# Patient Record
Sex: Female | Born: 1965 | Race: Black or African American | Hispanic: No | Marital: Married | Smoking: Never smoker
Health system: Southern US, Community
[De-identification: ages and names within clinical notes are randomized; demographics above are authoritative.]

## PROBLEM LIST (undated history)

## (undated) ENCOUNTER — Emergency Department (HOSPITAL_BASED_OUTPATIENT_CLINIC_OR_DEPARTMENT_OTHER): Discharge status: Absent without leave | Payer: Self-pay

## (undated) ENCOUNTER — Emergency Department (HOSPITAL_BASED_OUTPATIENT_CLINIC_OR_DEPARTMENT_OTHER): Disposition: A | Discharge status: Home / Self Care | Payer: Self-pay

## (undated) DIAGNOSIS — M5030 Other cervical disc degeneration, unspecified cervical region: Secondary | ICD-10-CM

## (undated) DIAGNOSIS — K219 Gastro-esophageal reflux disease without esophagitis: Secondary | ICD-10-CM

## (undated) DIAGNOSIS — K5792 Diverticulitis of intestine, part unspecified, without perforation or abscess without bleeding: Secondary | ICD-10-CM

## (undated) DIAGNOSIS — D259 Leiomyoma of uterus, unspecified: Secondary | ICD-10-CM

## (undated) DIAGNOSIS — I10 Essential (primary) hypertension: Secondary | ICD-10-CM

## (undated) DIAGNOSIS — Z9289 Personal history of other medical treatment: Secondary | ICD-10-CM

## (undated) DIAGNOSIS — E119 Type 2 diabetes mellitus without complications: Secondary | ICD-10-CM

## (undated) DIAGNOSIS — E876 Hypokalemia: Secondary | ICD-10-CM

## (undated) DIAGNOSIS — N189 Chronic kidney disease, unspecified: Secondary | ICD-10-CM

## (undated) DIAGNOSIS — R51 Headache: Secondary | ICD-10-CM

## (undated) DIAGNOSIS — N83209 Unspecified ovarian cyst, unspecified side: Secondary | ICD-10-CM

## (undated) DIAGNOSIS — R519 Headache, unspecified: Secondary | ICD-10-CM

## (undated) HISTORY — DX: Hypokalemia: E87.6

## (undated) HISTORY — PX: APPENDECTOMY: SHX54

---

## 1997-07-26 ENCOUNTER — Encounter (INDEPENDENT_AMBULATORY_CARE_PROVIDER_SITE_OTHER): Payer: Self-pay | Admitting: *Deleted

## 1997-07-26 LAB — CONVERTED CEMR LAB

## 1997-11-11 ENCOUNTER — Emergency Department (HOSPITAL_COMMUNITY): Admission: EM | Admit: 1997-11-11 | Discharge: 1997-11-11 | Payer: Self-pay | Admitting: Emergency Medicine

## 1998-01-22 ENCOUNTER — Other Ambulatory Visit: Admission: RE | Admit: 1998-01-22 | Discharge: 1998-01-22 | Payer: Self-pay | Admitting: Internal Medicine

## 2000-01-25 ENCOUNTER — Encounter: Payer: Self-pay | Admitting: *Deleted

## 2000-01-25 ENCOUNTER — Encounter: Payer: Self-pay | Admitting: Emergency Medicine

## 2000-01-26 ENCOUNTER — Inpatient Hospital Stay (HOSPITAL_COMMUNITY): Admission: EM | Admit: 2000-01-26 | Discharge: 2000-01-28 | Payer: Self-pay | Admitting: Emergency Medicine

## 2000-02-05 ENCOUNTER — Encounter: Admission: RE | Admit: 2000-02-05 | Discharge: 2000-02-05 | Payer: Self-pay | Admitting: Family Medicine

## 2001-09-27 ENCOUNTER — Encounter: Payer: Self-pay | Admitting: Emergency Medicine

## 2001-09-27 ENCOUNTER — Emergency Department (HOSPITAL_COMMUNITY): Admission: EM | Admit: 2001-09-27 | Discharge: 2001-09-28 | Payer: Self-pay | Admitting: Emergency Medicine

## 2001-09-29 ENCOUNTER — Other Ambulatory Visit: Admission: RE | Admit: 2001-09-29 | Discharge: 2001-09-29 | Payer: Self-pay | Admitting: Obstetrics & Gynecology

## 2001-10-03 ENCOUNTER — Encounter: Admission: RE | Admit: 2001-10-03 | Discharge: 2001-10-03 | Payer: Self-pay | Admitting: *Deleted

## 2002-02-05 ENCOUNTER — Emergency Department (HOSPITAL_COMMUNITY): Admission: EM | Admit: 2002-02-05 | Discharge: 2002-02-05 | Payer: Self-pay | Admitting: Emergency Medicine

## 2003-07-27 HISTORY — PX: CHOLECYSTECTOMY: SHX55

## 2003-08-28 ENCOUNTER — Emergency Department (HOSPITAL_COMMUNITY): Admission: EM | Admit: 2003-08-28 | Discharge: 2003-08-28 | Payer: Self-pay | Admitting: Family Medicine

## 2004-02-20 ENCOUNTER — Other Ambulatory Visit: Admission: RE | Admit: 2004-02-20 | Discharge: 2004-02-20 | Payer: Self-pay | Admitting: Obstetrics and Gynecology

## 2004-03-05 ENCOUNTER — Ambulatory Visit (HOSPITAL_COMMUNITY): Admission: RE | Admit: 2004-03-05 | Discharge: 2004-03-05 | Payer: Self-pay | Admitting: Obstetrics and Gynecology

## 2004-04-21 ENCOUNTER — Ambulatory Visit (HOSPITAL_COMMUNITY): Admission: RE | Admit: 2004-04-21 | Discharge: 2004-04-21 | Payer: Self-pay | Admitting: Obstetrics and Gynecology

## 2004-04-27 ENCOUNTER — Encounter: Admission: RE | Admit: 2004-04-27 | Discharge: 2004-04-27 | Payer: Self-pay | Admitting: Internal Medicine

## 2004-06-01 ENCOUNTER — Ambulatory Visit: Payer: Self-pay | Admitting: Internal Medicine

## 2004-06-02 ENCOUNTER — Ambulatory Visit: Payer: Self-pay | Admitting: Internal Medicine

## 2004-08-04 ENCOUNTER — Encounter: Admission: RE | Admit: 2004-08-04 | Discharge: 2004-08-04 | Payer: Self-pay | Admitting: General Surgery

## 2004-08-24 ENCOUNTER — Encounter: Admission: RE | Admit: 2004-08-24 | Discharge: 2004-08-24 | Payer: Self-pay | Admitting: General Surgery

## 2004-09-22 ENCOUNTER — Encounter (INDEPENDENT_AMBULATORY_CARE_PROVIDER_SITE_OTHER): Payer: Self-pay | Admitting: *Deleted

## 2004-09-22 ENCOUNTER — Observation Stay (HOSPITAL_COMMUNITY): Admission: RE | Admit: 2004-09-22 | Discharge: 2004-09-23 | Payer: Self-pay | Admitting: General Surgery

## 2005-02-01 ENCOUNTER — Emergency Department (HOSPITAL_COMMUNITY): Admission: EM | Admit: 2005-02-01 | Discharge: 2005-02-01 | Payer: Self-pay | Admitting: Emergency Medicine

## 2005-03-05 ENCOUNTER — Emergency Department (HOSPITAL_COMMUNITY): Admission: EM | Admit: 2005-03-05 | Discharge: 2005-03-05 | Payer: Self-pay | Admitting: Emergency Medicine

## 2006-01-28 ENCOUNTER — Encounter: Admission: RE | Admit: 2006-01-28 | Discharge: 2006-01-28 | Payer: Self-pay | Admitting: Internal Medicine

## 2006-09-23 ENCOUNTER — Encounter (INDEPENDENT_AMBULATORY_CARE_PROVIDER_SITE_OTHER): Payer: Self-pay | Admitting: *Deleted

## 2007-01-05 ENCOUNTER — Emergency Department (HOSPITAL_COMMUNITY): Admission: EM | Admit: 2007-01-05 | Discharge: 2007-01-05 | Payer: Self-pay | Admitting: Emergency Medicine

## 2007-08-03 ENCOUNTER — Emergency Department (HOSPITAL_COMMUNITY): Admission: EM | Admit: 2007-08-03 | Discharge: 2007-08-03 | Payer: Self-pay | Admitting: Emergency Medicine

## 2007-08-11 ENCOUNTER — Emergency Department (HOSPITAL_COMMUNITY): Admission: EM | Admit: 2007-08-11 | Discharge: 2007-08-11 | Payer: Self-pay | Admitting: Emergency Medicine

## 2008-01-19 ENCOUNTER — Emergency Department (HOSPITAL_COMMUNITY): Admission: EM | Admit: 2008-01-19 | Discharge: 2008-01-19 | Payer: Self-pay | Admitting: Emergency Medicine

## 2008-05-01 ENCOUNTER — Emergency Department (HOSPITAL_COMMUNITY): Admission: EM | Admit: 2008-05-01 | Discharge: 2008-05-01 | Payer: Self-pay | Admitting: Family Medicine

## 2008-07-08 ENCOUNTER — Emergency Department (HOSPITAL_COMMUNITY): Admission: EM | Admit: 2008-07-08 | Discharge: 2008-07-08 | Payer: Self-pay | Admitting: Family Medicine

## 2008-10-18 ENCOUNTER — Encounter (INDEPENDENT_AMBULATORY_CARE_PROVIDER_SITE_OTHER): Payer: Self-pay | Admitting: Nurse Practitioner

## 2008-10-18 ENCOUNTER — Ambulatory Visit: Payer: Self-pay | Admitting: Internal Medicine

## 2008-10-18 DIAGNOSIS — K219 Gastro-esophageal reflux disease without esophagitis: Secondary | ICD-10-CM | POA: Insufficient documentation

## 2008-11-01 ENCOUNTER — Ambulatory Visit: Payer: Self-pay | Admitting: *Deleted

## 2008-11-13 ENCOUNTER — Encounter (INDEPENDENT_AMBULATORY_CARE_PROVIDER_SITE_OTHER): Payer: Self-pay | Admitting: Nurse Practitioner

## 2008-11-13 ENCOUNTER — Ambulatory Visit: Payer: Self-pay | Admitting: Nurse Practitioner

## 2008-11-13 DIAGNOSIS — D509 Iron deficiency anemia, unspecified: Secondary | ICD-10-CM

## 2008-11-13 DIAGNOSIS — R3129 Other microscopic hematuria: Secondary | ICD-10-CM | POA: Insufficient documentation

## 2008-11-13 DIAGNOSIS — G43909 Migraine, unspecified, not intractable, without status migrainosus: Secondary | ICD-10-CM

## 2008-11-13 DIAGNOSIS — N760 Acute vaginitis: Secondary | ICD-10-CM | POA: Insufficient documentation

## 2008-11-13 DIAGNOSIS — K921 Melena: Secondary | ICD-10-CM | POA: Insufficient documentation

## 2008-11-13 DIAGNOSIS — D649 Anemia, unspecified: Secondary | ICD-10-CM

## 2008-11-13 HISTORY — DX: Migraine, unspecified, not intractable, without status migrainosus: G43.909

## 2008-11-13 HISTORY — DX: Iron deficiency anemia, unspecified: D50.9

## 2008-11-13 LAB — CONVERTED CEMR LAB
ALT: 20 units/L (ref 0–35)
AST: 13 units/L (ref 0–37)
Albumin: 4.3 g/dL (ref 3.5–5.2)
Alkaline Phosphatase: 66 units/L (ref 39–117)
BUN: 17 mg/dL (ref 6–23)
Basophils Absolute: 0 10*3/uL (ref 0.0–0.1)
Basophils Relative: 0 % (ref 0–1)
Bilirubin Urine: NEGATIVE
CO2: 26 meq/L (ref 19–32)
Calcium: 9.7 mg/dL (ref 8.4–10.5)
Chlamydia, DNA Probe: NEGATIVE
Chloride: 105 meq/L (ref 96–112)
Cholesterol: 150 mg/dL (ref 0–200)
Creatinine, Ser: 0.46 mg/dL (ref 0.40–1.20)
Eosinophils Absolute: 0.1 10*3/uL (ref 0.0–0.7)
Eosinophils Relative: 1 % (ref 0–5)
GC Probe Amp, Genital: NEGATIVE
Glucose, Bld: 82 mg/dL (ref 70–99)
Glucose, Urine, Semiquant: NEGATIVE
HCT: 37.9 % (ref 36.0–46.0)
HDL: 40 mg/dL (ref >39)
Hemoglobin: 11.8 g/dL — ABNORMAL LOW (ref 12.0–15.0)
KOH Prep: NEGATIVE
Ketones, urine, test strip: NEGATIVE
LDL Cholesterol: 94 mg/dL (ref 0–99)
Lymphocytes Relative: 37 % (ref 12–46)
Lymphs Abs: 2.6 10*3/uL (ref 0.7–4.0)
MCHC: 31.1 g/dL (ref 30.0–36.0)
MCV: 65.5 fL — ABNORMAL LOW (ref 78.0–100.0)
Monocytes Absolute: 0.4 10*3/uL (ref 0.1–1.0)
Monocytes Relative: 6 % (ref 3–12)
Neutro Abs: 4 10*3/uL (ref 1.7–7.7)
Neutrophils Relative %: 57 % (ref 43–77)
Nitrite: NEGATIVE
Platelets: 314 10*3/uL (ref 150–400)
Potassium: 4.5 meq/L (ref 3.5–5.3)
Protein, U semiquant: 30
RBC: 5.79 M/uL — ABNORMAL HIGH (ref 3.87–5.11)
RDW: 17.1 % — ABNORMAL HIGH (ref 11.5–15.5)
Sodium: 142 meq/L (ref 135–145)
Specific Gravity, Urine: 1.015
TSH: 0.908 microintl units/mL (ref 0.350–4.500)
Total Bilirubin: 0.3 mg/dL (ref 0.3–1.2)
Total CHOL/HDL Ratio: 3.8
Total Protein: 7.8 g/dL (ref 6.0–8.3)
Triglycerides: 78 mg/dL (ref <150)
Urobilinogen, UA: 0.2
VLDL: 16 mg/dL (ref 0–40)
WBC Urine, dipstick: NEGATIVE
WBC: 7 10*3/uL (ref 4.0–10.5)
pH: 7

## 2008-11-14 ENCOUNTER — Encounter (INDEPENDENT_AMBULATORY_CARE_PROVIDER_SITE_OTHER): Payer: Self-pay | Admitting: Nurse Practitioner

## 2008-11-15 ENCOUNTER — Encounter (INDEPENDENT_AMBULATORY_CARE_PROVIDER_SITE_OTHER): Payer: Self-pay | Admitting: Nurse Practitioner

## 2008-11-15 ENCOUNTER — Ambulatory Visit (HOSPITAL_COMMUNITY): Admission: RE | Admit: 2008-11-15 | Discharge: 2008-11-15 | Payer: Self-pay | Admitting: Family Medicine

## 2008-11-15 LAB — CONVERTED CEMR LAB: Retic Ct Pct: 1.2 % (ref 0.4–3.1)

## 2008-12-19 ENCOUNTER — Telehealth (INDEPENDENT_AMBULATORY_CARE_PROVIDER_SITE_OTHER): Payer: Self-pay | Admitting: Nurse Practitioner

## 2009-02-05 ENCOUNTER — Emergency Department (HOSPITAL_COMMUNITY): Admission: EM | Admit: 2009-02-05 | Discharge: 2009-02-05 | Payer: Self-pay | Admitting: Family Medicine

## 2009-02-12 ENCOUNTER — Encounter (INDEPENDENT_AMBULATORY_CARE_PROVIDER_SITE_OTHER): Payer: Self-pay | Admitting: Nurse Practitioner

## 2009-02-12 ENCOUNTER — Ambulatory Visit: Payer: Self-pay | Admitting: Family Medicine

## 2009-02-12 DIAGNOSIS — K625 Hemorrhage of anus and rectum: Secondary | ICD-10-CM | POA: Insufficient documentation

## 2009-02-12 DIAGNOSIS — R109 Unspecified abdominal pain: Secondary | ICD-10-CM | POA: Insufficient documentation

## 2009-02-12 LAB — CONVERTED CEMR LAB
Basophils Absolute: 0 10*3/uL (ref 0.0–0.1)
Basophils Relative: 0 % (ref 0–1)
Eosinophils Absolute: 0.1 10*3/uL (ref 0.0–0.7)
Eosinophils Relative: 1 % (ref 0–5)
HCT: 39.3 % (ref 36.0–46.0)
Helicobacter Pylori Antibody-IgG: 1 — ABNORMAL HIGH
Hemoglobin: 11.7 g/dL — ABNORMAL LOW (ref 12.0–15.0)
Lymphocytes Relative: 35 % (ref 12–46)
Lymphs Abs: 2.4 10*3/uL (ref 0.7–4.0)
MCHC: 29.8 g/dL — ABNORMAL LOW (ref 30.0–36.0)
MCV: 69.2 fL — ABNORMAL LOW (ref 78.0–100.0)
Monocytes Absolute: 0.4 10*3/uL (ref 0.1–1.0)
Monocytes Relative: 6 % (ref 3–12)
Neutro Abs: 4 10*3/uL (ref 1.7–7.7)
Neutrophils Relative %: 58 % (ref 43–77)
Platelets: 330 10*3/uL (ref 150–400)
RBC: 5.68 M/uL — ABNORMAL HIGH (ref 3.87–5.11)
RDW: 17.9 % — ABNORMAL HIGH (ref 11.5–15.5)
WBC: 6.8 10*3/uL (ref 4.0–10.5)

## 2009-02-14 ENCOUNTER — Encounter (INDEPENDENT_AMBULATORY_CARE_PROVIDER_SITE_OTHER): Payer: Self-pay | Admitting: Family Medicine

## 2009-02-19 ENCOUNTER — Ambulatory Visit (HOSPITAL_COMMUNITY): Admission: RE | Admit: 2009-02-19 | Discharge: 2009-02-19 | Payer: Self-pay | Admitting: Internal Medicine

## 2009-02-21 ENCOUNTER — Encounter (INDEPENDENT_AMBULATORY_CARE_PROVIDER_SITE_OTHER): Payer: Self-pay | Admitting: Nurse Practitioner

## 2009-02-26 ENCOUNTER — Ambulatory Visit: Payer: Self-pay | Admitting: Nurse Practitioner

## 2009-02-26 DIAGNOSIS — K5732 Diverticulitis of large intestine without perforation or abscess without bleeding: Secondary | ICD-10-CM | POA: Insufficient documentation

## 2009-02-26 HISTORY — DX: Diverticulitis of large intestine without perforation or abscess without bleeding: K57.32

## 2009-02-26 LAB — CONVERTED CEMR LAB
Cholesterol, target level: 200 mg/dL
HDL goal, serum: 40 mg/dL
LDL Goal: 160 mg/dL

## 2009-11-02 ENCOUNTER — Emergency Department (HOSPITAL_COMMUNITY): Admission: EM | Admit: 2009-11-02 | Discharge: 2009-11-02 | Payer: Self-pay | Admitting: Family Medicine

## 2009-11-11 ENCOUNTER — Ambulatory Visit: Payer: Self-pay | Admitting: Nurse Practitioner

## 2009-11-11 DIAGNOSIS — K047 Periapical abscess without sinus: Secondary | ICD-10-CM | POA: Insufficient documentation

## 2009-11-11 DIAGNOSIS — E669 Obesity, unspecified: Secondary | ICD-10-CM | POA: Insufficient documentation

## 2009-11-11 LAB — CONVERTED CEMR LAB
ALT: 17 units/L (ref 0–35)
AST: 11 units/L (ref 0–37)
Albumin: 4.5 g/dL (ref 3.5–5.2)
Alkaline Phosphatase: 58 units/L (ref 39–117)
BUN: 12 mg/dL (ref 6–23)
Basophils Absolute: 0 10*3/uL (ref 0.0–0.1)
Basophils Relative: 0 % (ref 0–1)
CO2: 25 meq/L (ref 19–32)
Calcium: 9.7 mg/dL (ref 8.4–10.5)
Chloride: 103 meq/L (ref 96–112)
Cholesterol: 148 mg/dL (ref 0–200)
Creatinine, Ser: 0.55 mg/dL (ref 0.40–1.20)
Eosinophils Absolute: 0 10*3/uL (ref 0.0–0.7)
Eosinophils Relative: 1 % (ref 0–5)
Glucose, Bld: 98 mg/dL (ref 70–99)
HCT: 41.2 % (ref 36.0–46.0)
HDL: 39 mg/dL — ABNORMAL LOW (ref >39)
Hemoglobin: 12.4 g/dL (ref 12.0–15.0)
LDL Cholesterol: 94 mg/dL (ref 0–99)
Lymphocytes Relative: 30 % (ref 12–46)
Lymphs Abs: 2.5 10*3/uL (ref 0.7–4.0)
MCHC: 30.1 g/dL (ref 30.0–36.0)
MCV: 70.9 fL — ABNORMAL LOW (ref 78.0–100.0)
Monocytes Absolute: 0.4 10*3/uL (ref 0.1–1.0)
Monocytes Relative: 5 % (ref 3–12)
Neutro Abs: 5.4 10*3/uL (ref 1.7–7.7)
Neutrophils Relative %: 64 % (ref 43–77)
Platelets: 330 10*3/uL (ref 150–400)
Potassium: 4.4 meq/L (ref 3.5–5.3)
RBC: 5.81 M/uL — ABNORMAL HIGH (ref 3.87–5.11)
RDW: 18 % — ABNORMAL HIGH (ref 11.5–15.5)
Rapid HIV Screen: NEGATIVE
Sodium: 138 meq/L (ref 135–145)
TSH: 0.754 microintl units/mL (ref 0.350–4.500)
Total Bilirubin: 0.3 mg/dL (ref 0.3–1.2)
Total CHOL/HDL Ratio: 3.8
Total Protein: 7.8 g/dL (ref 6.0–8.3)
Triglycerides: 74 mg/dL (ref <150)
VLDL: 15 mg/dL (ref 0–40)
WBC: 8.3 10*3/uL (ref 4.0–10.5)

## 2009-11-12 ENCOUNTER — Encounter (INDEPENDENT_AMBULATORY_CARE_PROVIDER_SITE_OTHER): Payer: Self-pay | Admitting: Nurse Practitioner

## 2009-12-24 ENCOUNTER — Ambulatory Visit: Payer: Self-pay | Admitting: Nurse Practitioner

## 2009-12-24 LAB — CONVERTED CEMR LAB
Bilirubin Urine: NEGATIVE
Chlamydia, DNA Probe: NEGATIVE
GC Probe Amp, Genital: NEGATIVE
Glucose, Urine, Semiquant: NEGATIVE
KOH Prep: NEGATIVE
Ketones, urine, test strip: NEGATIVE
Nitrite: NEGATIVE
OCCULT 1: NEGATIVE
Specific Gravity, Urine: 1.015
Urobilinogen, UA: 0.2
WBC Urine, dipstick: NEGATIVE
pH: 7

## 2009-12-26 ENCOUNTER — Ambulatory Visit (HOSPITAL_COMMUNITY): Admission: RE | Admit: 2009-12-26 | Discharge: 2009-12-26 | Payer: Self-pay | Admitting: Internal Medicine

## 2009-12-28 ENCOUNTER — Emergency Department (HOSPITAL_COMMUNITY): Admission: EM | Admit: 2009-12-28 | Discharge: 2009-12-28 | Payer: Self-pay | Admitting: Emergency Medicine

## 2009-12-31 ENCOUNTER — Encounter (INDEPENDENT_AMBULATORY_CARE_PROVIDER_SITE_OTHER): Payer: Self-pay | Admitting: Nurse Practitioner

## 2009-12-31 LAB — CONVERTED CEMR LAB: Pap Smear: NEGATIVE

## 2010-04-30 ENCOUNTER — Telehealth (INDEPENDENT_AMBULATORY_CARE_PROVIDER_SITE_OTHER): Payer: Self-pay | Admitting: Nurse Practitioner

## 2010-08-25 NOTE — Assessment & Plan Note (Signed)
Summary: Complete Physical Exam   Vital Signs:  Patient profile:   45 year old female Menstrual status:  last cycle in 2005 Weight:      202.2 pounds BMI:     40.99 BSA:     1.85 Temp:     97.9 degrees F oral Pulse rate:   88 / minute Pulse rhythm:   regular Resp:     20 per minute BP sitting:   135 / 92  (left arm) Cuff size:   large  Vitals Entered By: Levon Hedger (December 24, 2009 9:06 AM) CC: CPP...tooth pain  Is Patient Diabetic? No Pain Assessment Patient in pain? yes     Location: tooth Intensity: 15 Onset of pain  Constant  Does patient need assistance? Functional Status Self care Ambulation Normal   CC:  CPP...tooth pain .  History of Present Illness:  Pt into the office for a complete physical exam  PAP - Maternal aunt with cervical cancer All previous pt pap smears normal Menses - none since 2006 after she had her gallbladder removed. Unsure if mother went through early menopause  Mammogram - last done 1 year ago mother and sister with breast cancer survival already scheduled next mammogram on this friday  Optho - no glasses. no recent dental exam  Dental - seen in this office for an abscess 1 month ago. Still with dental problems and she called this morning and she has an appt for monday  tdap - up to date  Habits & Providers  Alcohol-Tobacco-Diet     Alcohol drinks/day: 0     Tobacco Status: never  Exercise-Depression-Behavior     Does Patient Exercise: no     Exercise Counseling: to improve exercise regimen     Have you felt down or hopeless? no     Have you felt little pleasure in things? no     Drug Use: never     Seat Belt Use: sometimes  Comments: PHQ- 9 score 17  Allergies: No Known Drug Allergies  Review of Systems General:  Denies fever; mouth pain from dental problems. Eyes:  Denies blurring. ENT:  Denies earache. CV:  Denies chest pain or discomfort. Resp:  Denies cough. GI:  Complains of diarrhea; denies  abdominal pain; intermittent. GU:  Denies discharge. MS:  Denies joint pain. Derm:  Denies rash. Neuro:  Denies headaches. Psych:  Denies anxiety and depression.  Physical Exam  General:  alert.   Head:  normocephalic.   Eyes:  pupils reactive to light.   Ears:  ear piercing(s) noted.   Nose:  no nasal discharge.   Mouth:  right lower molar - broken tooth inflammed gingiva Neck:  supple.   Chest Wall:  no mass.   Breasts:  right upper outer quad - dense tissue Lungs:  normal breath sounds.   Heart:  normal rate and regular rhythm.   Abdomen:  soft, non-tender, and normal bowel sounds.   Rectal:  no external abnormalities.   Msk:  normal ROM.   Pulses:  R radial normal and L radial normal.   Extremities:  no edema Neurologic:  alert & oriented X3.   Skin:  color normal.   Psych:  Oriented X3.    Pelvic Exam  Vulva:      normal appearance.   Urethra and Bladder:      Urethra--normal.   Vagina:      physiologic discharge.   Cervix:      normal.   Uterus:  smooth.   Adnexa:      nontender bilaterally.   Rectum:      normal, heme negative stool.      Impression & Recommendations:  Problem # 1:  ROUTINE GYNECOLOGICAL EXAMINATION (ICD-V72.31) PHQ- 9 score = 17 PAP done dental referral done - pt has an appt on next week rec optho exam guaiac negative barium enema done in 2010 Orders: UA Dipstick w/o Micro (manual) (81191) KOH/ WET Mount [phone]) Hemoccult Guaiac-1 spec.(in office) (82270) Pap Smear, Thin Prep ( Collection of) (Q0091) T- GC Chlamydia (56213)  Problem # 2:  OTHER SCREENING BREAST EXAMINATION (ICD-V76.19) mammogram scheduled by pt in 2 days advised pt to keep appt mother and sister with hx of breast cancer  Problem # 3:  OBESITY (ICD-278.00) advised pt to increase her physical activity  Problem # 4:  DIVERTICULITIS OF COLON (ICD-562.11) handout given with dx  Problem # 5:  ABSCESS, TOOTH (ICD-522.5) pt has an appt on next  week will give her another course of antibiotics and pain meds - pt still has some swelling and pain today  Complete Medication List: 1)  Protonix 40 Mg Tbec (Pantoprazole sodium) .... One tablet by mouth daily before breakfast 2)  Maxalt 10 Mg Tabs (Rizatriptan benzoate) .... One tablet by mouth at onset of headache, may repeat in 2 hours if headache continues 3)  Ferrous Sulfate 325 (65 Fe) Mg Tabs (Ferrous sulfate) .... One tablet by mouth daily 4)  Amoxicillin 500 Mg Caps (Amoxicillin) .... One capsule by mouth three times a day 5)  Tylenol With Codeine #3 300-30 Mg Tabs (Acetaminophen-codeine) .... One tablet by mouth every 6 hours as needed for pain 6)  Ibuprofen 800 Mg Tabs (Ibuprofen) .... One tablet by mouth two times a day as needed for pain  Patient Instructions: 1)  keep appointment for mammogram 2)  Dental - keep your appointment 3)  May restart amoxil three times a day for infection 4)  Tylenol with codiene as needed for pain 5)  Follow up at least every 6 months with n.martin,fnp Prescriptions: PROTONIX 40 MG TBEC (PANTOPRAZOLE SODIUM) ONe tablet by mouth daily before breakfast  #30 x 5   Entered and Authorized by:   Lehman Prom FNP   Signed by:   Lehman Prom FNP on 12/25/2009   Method used:   Faxed to ...       [facility] (retail)       [address] [place], [place]  [address]       Ph: [phone]       Fax: [phone]   RxID:   [id number] TYLENOL WITH CODEINE #3 300-30 MG TABS (ACETAMINOPHEN-CODEINE) One tablet by mouth every 6 hours as needed for pain  #30 x 0   Entered and Authorized by:   Lehman Prom FNP   Signed by:   Lehman Prom FNP on 12/24/2009   Method used:   Print then Give to Patient   RxID:   [id number] AMOXICILLIN 500 MG CAPS (AMOXICILLIN) One capsule by mouth three times a day  #30 x 0   Entered and Authorized by:   Lehman Prom FNP   Signed by:   Lehman Prom FNP on  12/24/2009   Method used:   Print then Give to Patient   RxID:   [id number]   Laboratory Results   Urine Tests  Date/Time Received: December 24, 2009 9:16 AM   Routine Urinalysis  Color: lt. yellow Appearance: Hazy Glucose: negative   (Normal Range: Negative) Bilirubin: negative   (Normal Range: Negative) Ketone: negative   (Normal Range: Negative) Spec. Gravity: 1.015   (Normal Range: 1.003-1.035) Blood: trace-lysed   (Normal Range: Negative) pH: 7.0   (Normal Range: 5.0-8.0) Protein: trace   (Normal Range: Negative) Urobilinogen: 0.2   (Normal Range: 0-1) Nitrite: negative   (Normal Range: Negative) Leukocyte Esterace: negative   (Normal Range: Negative)      Wet Mount/KOH Source: vaginal WBC/hpf: 1-5 Bacteria/hpf: rare Clue cells/hpf: none Yeast/hpf: none Trichomonas/hpf: none  Stool - Occult Blood Hemmoccult #1: negative Date: 12/24/2009    Laboratory Results   Urine Tests    Routine Urinalysis   Color: lt. yellow Appearance: Hazy Glucose: negative   (Normal Range: Negative) Bilirubin: negative   (Normal Range: Negative) Ketone: negative   (Normal Range: Negative) Spec. Gravity: 1.015   (Normal Range: 1.003-1.035) Blood: trace-lysed   (Normal Range: Negative) pH: 7.0   (Normal Range: 5.0-8.0) Protein: trace   (Normal Range: Negative) Urobilinogen: 0.2   (Normal Range: 0-1) Nitrite: negative   (Normal Range: Negative) Leukocyte Esterace: negative   (Normal Range: Negative)      Wet Mount Wet Mount KOH: Negative  Stool - Occult Blood Hemmoccult #1: negative    Prevention & Chronic Care Immunizations   Influenza vaccine: Not documented    Tetanus booster: 07/26/2004: historical per pt    Pneumococcal vaccine: Not documented  Other Screening   Pap smear:  Specimen Adequacy: Satisfactory for evaluation.   Interpretation/Result:Negative for intraepithelial Lesion or Malignancy.     (11/13/2008)   Pap smear action/deferral: Ordered   (12/24/2009)    Mammogram: ASSESSMENT: Negative - BI-RADS 1^MM DIGITAL SCREENING  (11/15/2008)   Mammogram action/deferral: Ordered  (12/24/2009)   Smoking status: never  (12/24/2009)  Lipids   Total Cholesterol: 148  (11/11/2009)   LDL: 94  (11/11/2009)   LDL Direct: Not documented   HDL: 39  (11/11/2009)   Triglycerides: 74  (11/11/2009)

## 2010-08-25 NOTE — Letter (Signed)
Summary: TEST ORDER FORM/RADIOLOGY/APPT DATE & TIME  TEST ORDER FORM/RADIOLOGY/APPT DATE & TIME   Imported By: Arta Bruce 02/14/2009 10:47:49  _____________________________________________________________________  External Attachment:    Type:   Image     Comment:   External Document

## 2010-08-25 NOTE — Letter (Signed)
Summary: REFERRAL//DENTAL  REFERRAL//DENTAL   Imported By: Arta Bruce 11/12/2009 10:00:38  _____________________________________________________________________  External Attachment:    Type:   Image     Comment:   External Document

## 2010-08-25 NOTE — Letter (Signed)
Summary: Handout Printed  Printed Handout:  - Headache, Migraine 

## 2010-08-25 NOTE — Letter (Signed)
Summary: *HSN Results Follow up  [facility]  [address] [place], [place] [address]   Phone: [phone]  Fax: [phone]      12/31/2009   SMRITI BARKOW [address] [place], [place]  [address]   Dear  Ms. Sarika Spadoni,                            ____S.Drinkard,FNP   ____D. Gore,FNP       ____B. McPherson,MD   ____V. Rankins,MD    ____E. Mulberry,MD    _X___N. Daphine Deutscher, FNP  ____D. Reche Dixon, MD    ____K. Philipp Deputy, MD    ____Other     This letter is to inform you that your recent test(s):  _______Pap Smear    ___X____Lab Test     _______X-ray    ___X____ is within acceptable limits  _______ requires a medication change  _______ requires a follow-up lab visit  _______ requires a follow-up visit with your provider   Comments: Pap smear results normal.       _________________________________________________________ If you have any questions, please contact our office [phone].                    Sincerely,    Lehman Prom FNP [facility]

## 2010-08-25 NOTE — Letter (Signed)
Summary: *HSN Results Follow up  [facility]  [address] [place], [place] [address]   Phone: [phone]  Fax: [phone]      11/15/2008   SHELISA FERN [address] [place], [place]  [address]   Dear  Ms. Analyah Duva,                            ____S.Drinkard,FNP   ____D. Gore,FNP       ____B. McPherson,MD   ____V. Rankins,MD    ____E. Mulberry,MD    __X__N. Daphine Deutscher, FNP  ____D. Reche Dixon, MD    ____K. Philipp Deputy, MD    ____Other     This letter is to inform you that your recent test(s):  _______Pap Smear    ____X___Lab Test     _______X-ray    ___X____ is within acceptable limits  _______ requires a medication change  _______ requires a follow-up lab visit  _______ requires a follow-up visit with your provider   Comments:  Labs done during your recent office visit were normal except you were slightly anemic.  You need to start taking ferrous sulfate 325mg  by mouth  daily.  This can be purchased over the counter.  You can also start a iron rich diet. Pap Smear results ___________________________.      _________________________________________________________ If you have any questions, please contact our office [phone].                    Sincerely,  Lehman Prom FNP [facility]

## 2010-08-25 NOTE — Letter (Signed)
Summary: PT INFORMATION SHEET  PT INFORMATION SHEET   Imported By: Arta Bruce 02/02/2010 14:55:38  _____________________________________________________________________  External Attachment:    Type:   Image     Comment:   External Document

## 2010-08-25 NOTE — Letter (Signed)
Summary: Handout Printed  Printed Handout:  - Diverticulosis-Brief 

## 2010-08-25 NOTE — Letter (Signed)
Summary: PATIENT INFORMATION & HISTORY SHEET  PATIENT INFORMATION & HISTORY SHEET   Imported ByArta Bruce 10/23/2008 10:09:55  _____________________________________________________________________  External Attachment:    Type:   Image     Comment:   External Document

## 2010-08-25 NOTE — Letter (Signed)
Summary: SLIDING FEE SCALE & ELIGIBILITY AGREEMNET  SLIDING FEE SCALE & ELIGIBILITY AGREEMNET   Imported By: Arta Bruce 10/23/2008 10:13:07  _____________________________________________________________________  External Attachment:    Type:   Image     Comment:   External Document

## 2010-08-25 NOTE — Letter (Signed)
Summary: Handout Printed  Printed Handout:  - Subconjunctival Hemorrhage-Brief 

## 2010-08-25 NOTE — Progress Notes (Signed)
Summary: Headache  Phone Note Call from Patient Call back at Home Phone [phone] Call back at [phone]   Caller: Patient Summary of Call: The pt is wondering if the Elmira Olkowski can call in for the medication that help her with the headache; she doesn't remember the name of the medication.  [facility]  [place] FNP Initial call taken by: Manon Hilding,  Dec 19, 2008 3:58 PM  Follow-up for Phone Call        Maxalt last filled 11/13/08 #10 Forward to N. Daphine Deutscher, FNP Follow-up by: Levon Hedger,  December 24, 2008 5:24 PM  Additional Follow-up for Phone Call Additional follow up Details #1::        Rx in basket. fax to [facility] pharmacy and notify pt  Additional Follow-up by: Lehman Prom FNP,  December 25, 2008 7:05 PM    Additional Follow-up for Phone Call Additional follow up Details #2::    pt informed.  Rx faxed to [place] Follow-up by: Levon Hedger,  December 27, 2008 4:56 PM    Prescriptions: MAXALT 10 MG TABS (RIZATRIPTAN BENZOATE) one tablet by mouth at onset of headache, may repeat in 2 hours if headache continues  #10 x 0   Entered and Authorized by:   Lehman Prom FNP   Signed by:   Lehman Prom FNP on 12/25/2008   Method used:   Printed then faxed to ...         RxID:   [id number]

## 2010-08-25 NOTE — Letter (Signed)
Summary: BEHAVIORAL GUIDELINE  BEHAVIORAL GUIDELINE   Imported By: Arta Bruce 10/23/2008 10:11:54  _____________________________________________________________________  External Attachment:    Type:   Image     Comment:   External Document

## 2010-08-25 NOTE — Letter (Signed)
Summary: Handout Printed  Printed Handout:  - Diverticulosis 

## 2010-08-25 NOTE — Letter (Signed)
Summary: Handout Printed  Printed Handout:  - Diet - Iron Rich 

## 2010-08-25 NOTE — Assessment & Plan Note (Signed)
Summary: 45 PT/BLOOD IN STOOL//KT   Vital Signs:  Patient profile:   45 year old female Menstrual status:  last cycle in 2005 Weight:      204 pounds BMI:     41.35 BSA:     1.86 Temp:     98.4 degrees F oral Pulse rate:   88 / minute Pulse rhythm:   regular BP sitting:   123 / 86  (left arm) Cuff size:   regular  Vitals Entered By: Levon Hedger (February 12, 2009 8:33 AM) CC: on last week pt does not have a gallbladder at the beginning of the week had not had a bowel movement in 3 days, then when she did have one it was alot of blood and it was pure bright red Pain Assessment Patient in pain? yes     Location: stomach Intensity: 7 Onset of pain  Constant  Does patient need assistance? Functional Status Self care Ambulation Normal Comments pt states she has not eaten or drank anything because it makes her have bowel movement with just water.  pt went to [facility] urgent care last Wednesday.   CC:  on last week pt does not have a gallbladder at the beginning of the week had not had a bowel movement in 3 days and then when she did have one it was alot of blood and it was pure bright red.  History of Present Illness: Pt presents with a concern over rectal bleeding last week (on Tuesday).  Pt has had before but said it was more than usual. She had noticed abd pain on Monday evening which she felt was from being constipated.  Pt didn't take anything for constipation.  She awakened on Tuesday am and drank a cup of hot tea and then she had several bowel movements, all with blood in them..  Last BM prior to that was a normal one on Sunday.  Pt says she has had chronic pain since having her gallbladder out in 2005.  She then had chills and sweats akk day,  No vomiting, but intermittent nausea is sometimes associated with her chronic abd pain.  Pt has hx of blood in stools, but never seen by GI in past.  Pt went to Urgent Care about a sore throat.  Strep was negative.  Said she told ER about her  bloody stooks and abd pain the day before and they told her to follow up with her regualr MD.  Since then, she is back to having normal BM's but abd pain is still there and she feels weak.  She is taking Aciphex for heartburn and she is also completing a course of Amoxicillin from the Er for respiratory signs.    Habits & Providers  Alcohol-Tobacco-Diet     Alcohol drinks/day: 0     Tobacco Status: never  Allergies (verified): No Known Drug Allergies PMH-FH-SH reviewed-no changes except otherwise noted, PMH-FH-SH reviewed for relevance  Physical Exam  General:  Well-developed,well-nourished,in no acute distress; alert,appropriate and cooperative throughout examination Lungs:  Normal respiratory effort, chest expands symmetrically. Lungs are clear to auscultation, no crackles or wheezes. Heart:  Normal rate and regular rhythm. S1 and S2 normal without gallop, murmur, click, rub or other extra sounds. Abdomen:  soft with no abd tenderness.  No r/g/r/ Rectal:  tender rectal digital insertion with Heme negative stool.  No discerable hemorrhoids Extremities:  No clubbing, cyanosis, edema, or deformity noted with normal full range of motion of all joints.   Neurologic:  A& O Neuro grossly intact. Gait steady Skin:  Intact without suspicious lesions or rashes   Impression & Recommendations:  Problem # 1:  RECTAL BLEEDING (ICD-569.3)  Heme negative today Check CBC and schedule for Barium Emema  Orders: Hemoccult Guaiac-1 spec.(in office) (82270) T-CBC w/Diff ([id number]) Radiology other (Radiology Other)  Problem # 2:  ABDOMINAL PAIN, CHRONIC (ICD-789.00)  Continue Aciphex for now. Check Hpylori  Orders: T-H Pylori AG, EIA (59563)  Complete Medication List: 1)  Aciphex 20 Mg Tbec (Rabeprazole sodium) .... Take one tablet twice daily * 2)  Maxalt 10 Mg Tabs (Rizatriptan benzoate) .... One tablet by mouth at onset of headache, may repeat in 2 hours if headache continues 3)   Ferrous Sulfate 325 (65 Fe) Mg Tabs (Ferrous sulfate) .... One tablet by mouth daily  Patient Instructions: 1)  Follow up in two weeks for reevaluation.  We are checking bloodwork today and will call you with any abnormal results that cannot wait for discussion on your follow up visit. Recommend a stool softner such as Colace one daily with a full glass of water. 2)  It tests reveal any abnormality, we may refer you a gastroenterologist for further evaluation. 3)  Please schedule a follow-up appointment in 2 weeks.  Prescriptions: ACIPHEX 20 MG TBEC (RABEPRAZOLE SODIUM) take one tablet twice daily *  #30 x 5   Entered and Authorized by:   Willis Modena MSN, FNP-C   Signed by:   Willis Modena MSN, FNP-C on 02/12/2009   Method used:   Print then Give to Patient   RxID:   [id number] FERROUS SULFATE 325 (65 FE) MG TABS (FERROUS SULFATE) one tablet by mouth daily  #30 x 1   Entered and Authorized by:   Willis Modena MSN, FNP-C   Signed by:   Willis Modena MSN, FNP-C on 02/12/2009   Method used:   Print then Give to Patient   RxID:   [id number]

## 2010-08-25 NOTE — Letter (Signed)
Summary: Work Excuse  [facility]  [address] [place], [place] [address]   Phone: [phone]  Fax: [phone]    Today's Date: November 11, 2009  Name of Patient: Katie Woodard  The above named patient had a medical visit today to follow up on a Urgent care visit on November 02, 2009.  Please take this into consideration when reviewing the time away from work    Special Instructions:  [  ] None  [  X] To be off the remainder of today, returning to the normal work scheduled on November 12, 2009  [  ] To be off until the next scheduled appointment on ______________________.  [  ] Other ________________________________________________________________ ________________________________________________________________________   Sincerely yours,   Lehman Prom FNP

## 2010-08-25 NOTE — Letter (Signed)
Summary: Handout Printed  Printed Handout:  - Diet - High-Fiber 

## 2010-08-25 NOTE — Progress Notes (Signed)
Summary: MED REFILLS  Phone Note Call from Patient Call back at [phone] Message from:  Patient  Refills Requested: Medication #1:  IBUPROFEN 800 MG TABS One tablet by mouth two times a day as needed for pain. Initial call taken by: Oscar La,  April 30, 2010 9:36 AM Caller: Patient Summary of Call: PT NEEDS A REFILL ON NEXIUM FOR HER HEART BURN, [facility] ON [address]. Initial call taken by: Oscar La,  April 30, 2010 9:36 AM  Follow-up for Phone Call        Pt. is Rxd protonix -- nexium d/c'd.  Left message on voicemail for pt. to return call.  Dutch Quint RN  April 30, 2010 10:08 AM  [facility] pharmacy on [address] called to have protonix and ibuprofen transferred from [place] per pt. request. Follow-up by: Dutch Quint RN,  April 30, 2010 12:11 PM     Appended Document: MED REFILLS Prescriptions: RANITIDINE HCL 150 MG TABS (RANITIDINE HCL) One tablet by mouth two times a day for stomach  #60 x 5   Entered and Authorized by:   Lehman Prom FNP   Signed by:   Lehman Prom FNP on 05/01/2010   Method used:   Electronically to        [facility] (retail)       [address]       [place], [place]  [address]       Ph: [phone]       Fax: [phone]   RxID:   [id number]  Pt. states that only ranitidine is a $4.00 med at [facility] and wonders if you would send a Rx in for that?  [facility] uses [facility] on [address].  Dutch Quint RN  May 01, 2010 12:06 PM  Rx for ranitidine sent to [facility] [place] n.martin,fnp May 01, 2010  12:14 PM  Pt. notified of Rx.  Dutch Quint RN  May 05, 2010 2:32 PM

## 2010-08-25 NOTE — Assessment & Plan Note (Signed)
Summary: Complete Physical Exam   Vital Signs:  Patient profile:   45 year old female Menstrual status:  last cycle in 2005 Height:      59 inches Weight:      204 pounds BMI:     41.35 BSA:     1.86 Pulse rate:   70 / minute Pulse rhythm:   regular Resp:     16 per minute BP sitting:   124 / 90  (left arm) Cuff size:   regular  Vitals Entered By: Levon Hedger (November 13, 2008 11:19 AM) CC: CPP, Headache, Abdominal Pain Is Patient Diabetic? No Pain Assessment Patient in pain? yes     Location: head  Does patient need assistance? Functional Status Self care Ambulation Normal   History of Present Illness:  Pt into the office for complete physical exam  Optho - no glasses or contacts.  last Wednesday - "red spot in eye".   +headache -vision changes -trauma to eye -coughing, sneezing, straining  dental - last dental exam was 4 years ago.  tetanus - last in 2006  colonscopy - none.  No change in stools.  No blood noted.    Dyspepsia History:      She has no alarm features of dyspepsia including no history of melena, hematochezia, dysphagia, persistent vomiting, or involuntary weight loss > 5%.  There is a prior history of GERD.  The patient does not have a prior history of documented ulcer disease.  An H-2 blocker medication is not currently being taken.  No previous upper endoscopy has been done.    Headache HPI:      The patient comes in for an acute, first time visit for headaches.  The current headache started approximately 11/06/2008.  The headaches will last anywhere from 30 minutes to several days at a time.  She has approximately 5+ headaches per month.  There is a family history of migraine headaches.        On a scale of 1-10, she describes the headache as a 6.  The headaches are associated with an aura.  The location of the headaches are unilateral-right.  Headache quality is throbbing or pulsating.  Aggravating factors include physical activity.  The  headaches are associated with photophobia.        The patient denies new onset H/A's in middle-age or later.        Additional history: right eye redness Caffiene use - denies any coffee or tea. She does drink some pepsi - 2 per day. She had to leave work early last week due to the headache.  She was taking excedrin however she stopped due to GI upset. .     Preventive Care Screening     PAP - last over 1 year ago. No hx of abnormal pap smears.  maternal aunt deceased with cervial cancer mammgram - never had one. Mother/sister are survivors.  No self breast exam at home.   Allergies (verified): No Known Drug Allergies  Review of Systems General:  Denies fever. Eyes:  Complains of red eye; denies eye pain. ENT:  Denies nasal congestion. CV:  Denies fatigue. Resp:  Denies cough. GI:  Denies abdominal pain. GU:  Denies discharge. MS:  Complains of joint pain. Derm:  Denies rash. Neuro:  Complains of headaches. Psych:  Denies depression.  Physical Exam  General:  alert.   Head:  normocephalic.   Eyes:  right eye with subconjunctival hematoma pupils equal, pupils round, and pupils reactive to  light.   Ears:  bil TM visible with minimal cerumen Nose:  no external deformity.   Mouth:  halotosispharynx pink and moist.   fair dentation Neck:  supple.   Chest Wall:  no mass.   Breasts:  skin/areolae normal, no masses, and no abnormal thickening.   Lungs:  normal breath sounds.   Heart:  normal rate and regular rhythm.   Abdomen:  soft, non-tender, normal bowel sounds, and no hepatomegaly.   Rectal:  no external abnormalities.   Msk:  normal ROM.   Pulses:  R radial normal, R dorsalis pedis normal, L radial normal, and L dorsalis pedis normal.   Extremities:  no edema Neurologic:  alert & oriented X3, cranial nerves II-XII intact, and gait normal.   Skin:  dry face Psych:  Oriented X3.    Pelvic Exam  Vulva:      normal appearance.   Urethra and Bladder:       Urethra--normal.  Bladder--normal.   Vagina:      physiologic discharge, malodorus.   Cervix:      midposition.   Uterus:      smooth.   Adnexa:      nontender bilaterally.   Rectum:      normal, heme + stool.      Impression & Recommendations:  Problem # 1:  ROUTINE GYNECOLOGICAL EXAMINATION (ICD-V72.31) labs done  PAP done guaiac positive.  stool cards x 3 sent home recommend optho and dental exam tetanus up to date Orders: Hemoccult Guaiac-1 spec.(in office) (82270) KOH/ WET Mount [phone]) Pap Smear, Thin Prep ( Collection of) (U0454) UA Dipstick w/o Micro (manual) (09811) T-General Health Panel (CBCD, CMP, TSH) ([id number]) T-Lipid Profile [phone]) T- GC Chlamydia (69629) T-HIV Antibody  (Reflex) ([id number]) T-Syphilis Test (RPR) (10272-53664)  Problem # 2:  OTHER SCREENING BREAST EXAMINATION (ICD-V76.19) self breast exam placcard given mammgram scheduled Orders: Mammogram (Screening) (Mammo)  Problem # 3:  HEMOCCULT POSITIVE STOOL (ICD-578.1) stool cards x 3 sent home with pt Orders: Hemoccult Cards (Take Home) (Hemoccult Cards)  Problem # 4:  MICROSCOPIC HEMATURIA (ICD-599.72) will send urine for culture Orders: T-Culture, Urine ([id number])  Problem # 5:  MIGRAINE HEADACHE (ICD-346.90) handout given advised pt to stop excedrin migraine, caffeine and chocolate Her updated medication list for this problem includes:    Maxalt 10 Mg Tabs (Rizatriptan benzoate) ..... One tablet by mouth at onset of headache, may repeat in 2 hours if headache continues  Problem # 6:  VAGINITIS, BACTERIAL (ICD-616.10)  Her updated medication list for this problem includes:    Metrogel-vaginal 0.75 % Gel (Metronidazole) ..... One application intravaginally at night  Complete Medication List: 1)  Aciphex 20 Mg Tbec (Rabeprazole sodium) .... Take one tablet twice daily * 2)  Maxalt 10 Mg Tabs (Rizatriptan benzoate) .... One tablet by mouth at onset of headache, may  repeat in 2 hours if headache continues 3)  Metrogel-vaginal 0.75 % Gel (Metronidazole) .... One application intravaginally at night   Patient Instructions: 1)  You need to drink caffiene free pepsi. 2)  You need to stop eating chocolate. 3)  Read handout on headache. 4)  You can take maxalt at onset of headache.  repeat in 2 hours if headache. 5)  Take the tylenol Extra Strength for the rebound headache that you may experience after stopping the caffiene. 6)  You will be notified of any abnormal labs. 7)  Keep mammogram appointment. 8)  Follow up as needed Prescriptions: METROGEL-VAGINAL 0.75 % GEL (METRONIDAZOLE) one  application intravaginally at night  #45gm x 0   Entered and Authorized by:   Lehman Prom FNP   Signed by:   Lehman Prom FNP on 11/13/2008   Method used:   Print then Give to Patient   RxID:   [id number] MAXALT 10 MG TABS (RIZATRIPTAN BENZOATE) one tablet by mouth at onset of headache, may repeat in 2 hours if headache continues  #10 x 0   Entered and Authorized by:   Lehman Prom FNP   Signed by:   Lehman Prom FNP on 11/13/2008   Method used:   Print then Give to Patient   RxID:   [id number]   Laboratory Results   Urine Tests  Date/Time Received: November 13, 2008 11:36 AM  Date/Time Reported: November 13, 2008 11:36 AM   Routine Urinalysis   Color: lt. yellow Appearance: Clear Glucose: negative   (Normal Range: Negative) Bilirubin: negative   (Normal Range: Negative) Ketone: negative   (Normal Range: Negative) Spec. Gravity: 1.015   (Normal Range: 1.003-1.035) Blood: trace-lysed   (Normal Range: Negative) pH: 7.0   (Normal Range: 5.0-8.0) Protein: 30   (Normal Range: Negative) Urobilinogen: 0.2   (Normal Range: 0-1) Nitrite: negative   (Normal Range: Negative) Leukocyte Esterace: negative   (Normal Range: Negative)    Date/Time Received: November 13, 2008 12:34 PM   Wet Mount/KOH Source: vaginal WBC/hpf: 1-5 Bacteria/hpf:  1+ Clue cells/hpf: few Yeast/hpf: none Trichomonas/hpf: none  Stool - Occult Blood Hemmoccult #1: positive Date: 11/13/2008        Laboratory Results   Urine Tests    Routine Urinalysis   Color: lt. yellow Appearance: Clear Glucose: negative   (Normal Range: Negative) Bilirubin: negative   (Normal Range: Negative) Ketone: negative   (Normal Range: Negative) Spec. Gravity: 1.015   (Normal Range: 1.003-1.035) Blood: trace-lysed   (Normal Range: Negative) pH: 7.0   (Normal Range: 5.0-8.0) Protein: 30   (Normal Range: Negative) Urobilinogen: 0.2   (Normal Range: 0-1) Nitrite: negative   (Normal Range: Negative) Leukocyte Esterace: negative   (Normal Range: Negative)      Wet Mount/KOH KOH Negative

## 2010-08-25 NOTE — Assessment & Plan Note (Signed)
Summary: Acute - Abscess   Vital Signs:  Patient profile:   45 year old female Menstrual status:  last cycle in 2005 Weight:      203.9 pounds BMI:     41.33 BSA:     1.86 Temp:     98.3 degrees F oral Pulse rate:   83 / minute Pulse rhythm:   regular Resp:     20 per minute BP sitting:   122 / 88  (left arm) Cuff size:   large  Vitals Entered By: Levon Hedger (November 11, 2009 10:28 AM) CC: tooth pain x 1 week with swelling...went to urgent care and was treated with antibiotic and pain meds... needs dental work Is Patient Diabetic? No Pain Assessment Patient in pain? no       Does patient need assistance? Functional Status Self care Ambulation Normal   CC:  tooth pain x 1 week with swelling...went to urgent care and was treated with antibiotic and pain meds... needs dental work.  History of Present Illness:  Pt into the office today with complaints for tooth pain Urgent care visit on 11/02/2009 (report reviewed)  Tooth extraction about 7 years ago and ? small bit of tooth remained no routine dental care since that time but was doing relatively well Ok until about 1 week ago when she was eating and following her meal her mouth was sore Two days later her right jaw was swollen and very painful Pt went to urgent care and was given amoxil (which she has finished) and tylenol with codeine. pt is unable to eat due to pain. 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 CV:  Denies chest pain or discomfort. Resp:  Denies cough. GI:  Denies abdominal pain, nausea, and vomiting. Neuro:  Complains of headaches.  Physical Exam  General:  alert.   Head:  normocephalic.   Mouth:  right jaw swelling right lower molars - at least 2 teeth with  cavities Lungs:  normal breath sounds.   Heart:  normal rate and regular rhythm.   Msk:  up to the exam table Neurologic:  alert & oriented X3.   Cervical Nodes:  mandibular LAD - mobile, circumscribed, tender Psych:  Oriented X3.     Impression & Recommendations:  Problem # 1:  ABSCESS, TOOTH (ICD-522.5)  Pt seen in the urgent care on 11/02/2009 - full note reviewed pt started on amoxil and tylenol with codeine at that time (which she has finished)  Orders: Dental Referral (Dentist) T-CBC w/Diff [id number]) Rapid HIV  (92370) Pt made aware that CPE -she will need to update eligibility before scheduling but will order labs today  Complete Medication List: 1)  Aciphex 20 Mg Tbec (Rabeprazole sodium) .... Take one tablet twice daily * 2)  Maxalt 10 Mg Tabs (Rizatriptan benzoate) .... One tablet by mouth at onset of headache, may repeat in 2 hours if headache continues 3)  Ferrous Sulfate 325 (65 Fe) Mg Tabs (Ferrous sulfate) .... One tablet by mouth daily 4)  Amoxicillin 500 Mg Caps (Amoxicillin) .... One capsule by mouth three times a day 5)  Tylenol With Codeine #3 300-30 Mg Tabs (Acetaminophen-codeine) .... One tablet by mouth every 6 hours as needed for pain 6)  Ibuprofen 800 Mg Tabs (Ibuprofen) .Marland KitchenMarland KitchenMarland Kitchen  One tablet by mouth two times a day as needed for pain  Other Orders: T-Lipid Profile ([id number]) T-Comprehensive Metabolic Panel (75643-32951) T-TSH [id number])  Patient Instructions: 1)  Remember that you should eat soft foods -  2)  applesauce, bread, potatos, rice, bananas 3)  No hard foods or foods that make you chew lots. 4)  Teeth may also be sensitive to hot and cold foods 5)  Labs will be checked today and you will be notified of the results 6)  Take ibuprofen 800mg  by mouth two times a day for the next 2 days then as needed  7)  Schedule your complete physical exam after you renew your eligibility. Prescriptions: IBUPROFEN 800 MG TABS (IBUPROFEN) One tablet by  mouth two times a day as needed for pain  #30 x 0   Entered and Authorized by:   Lehman Prom FNP   Signed by:   Lehman Prom FNP on 11/11/2009   Method used:   Print then Give to Patient   RxID:   [id number]   Laboratory Results  Date/Time Received: November 11, 2009 11:37 AM   Other Tests  Rapid HIV: negative

## 2010-08-25 NOTE — Assessment & Plan Note (Signed)
Summary: Barium Enema results   Vital Signs:  Patient profile:   45 year old female Menstrual status:  last cycle in 2005 Weight:      204.5 pounds Temp:     98.1 degrees F oral Pulse rate:   75 / minute Pulse rhythm:   regular BP sitting:   132 / 88  (left arm) Cuff size:   large  Vitals Entered By: Mikey College CMA (February 26, 2009 9:51 AM) CC: pt states here f/u test results of barium enema. Pt also c/o headache & meds not helping today., Lipid Management, Headache Is Patient Diabetic? No Pain Assessment Patient in pain? yes     Location: headache Intensity: 10  Does patient need assistance? Functional Status Self care Ambulation Normal   CC:  pt states here f/u test results of barium enema. Pt also c/o headache & meds not helping today., Lipid Management, and Headache.  History of Present Illness: Pt into the office for results of barium enema. She presented to the office previously with c/o bloody stool, seen by S. Drinkard. 3 episodes which caused pt to come  for a visit Diarrhea about 3 times per day. +abd pain  -fever very little fiber in diet but has started to add more baked instead of fried foods No family hx of colon cancer but father did have diverticulosis  Medications present with pt today  Headache HPI:      The headaches will last anywhere from 30 minutes to several days at a time.  She has approximately 5+ headaches per month.  There is a family history of migraine headaches.        On a scale of 1-10, she describes the headache as a 6.  The headaches are associated with an aura.  The location of the headaches are unilateral-right.  Headache quality is throbbing or pulsating.  Aggravating factors include physical activity.  The headaches are associated with photophobia.        Additional history: Woke this morning with headache and took a Maxalt - woke at 5:30 Headaches seems as if it is subsiding. Initially headache was behind both eyes but is now  limited to left eye. Pt was not able to drive to the office today.    Lipid Management History:      Negative NCEP/ATP III risk factors include female age less than 58 years old and non-tobacco-user status.     Habits & Providers  Alcohol-Tobacco-Diet     Alcohol drinks/day: 0     Tobacco Status: never  Exercise-Depression-Behavior     Does Patient Exercise: no     Exercise Counseling: to improve exercise regimen     Drug Use: never     Seat Belt Use: sometimes  Allergies: No Known Drug Allergies  Social History: Does Patient Exercise:  no  Review of Systems CV:  Denies chest pain or discomfort. Resp:  Denies cough. GI:  Complains of abdominal pain and diarrhea; denies nausea and vomiting. Neuro:  Complains of headaches.  Physical Exam  General:  alert.   Head:  normocephalic.   Eyes:  pupils reactive to light.   Ears:  ear piercing(s) noted.   Lungs:  normal breath sounds.   Heart:  normal rate and regular rhythm.   Neurologic:  alert & oriented X3.     Impression & Recommendations:  Problem # 1:  DIVERTICULITIS OF COLON (ICD-562.11) noted on barium enema handout given need to increase fiber in diet  Problem #  2:  MIGRAINE HEADACHE (ICD-346.90) Treximet samples given Her updated medication list for this problem includes:    Maxalt 10 Mg Tabs (Rizatriptan benzoate) ..... One tablet by mouth at onset of headache, may repeat in 2 hours if headache continues  Complete Medication List: 1)  Aciphex 20 Mg Tbec (Rabeprazole sodium) .... Take one tablet twice daily * 2)  Maxalt 10 Mg Tabs (Rizatriptan benzoate) .... One tablet by mouth at onset of headache, may repeat in 2 hours if headache continues 3)  Ferrous Sulfate 325 (65 Fe) Mg Tabs (Ferrous sulfate) .... One tablet by mouth daily  Lipid Assessment/Plan:      Based on NCEP/ATP III, the patient's risk factor category is "0-1 risk factors".  The patient's lipid goals are as follows: Total cholesterol goal is  200; LDL cholesterol goal is 160; HDL cholesterol goal is 40; Triglyceride goal is 150.     Patient Instructions: 1)  You will need to increase the fiber in your diet. 2)  Read the  handout. 3)  You can take treximet if headache continues. 4)  Follow up as needed

## 2010-08-25 NOTE — Letter (Signed)
Summary: *HSN Results Follow up  [facility]  [address] [place], [place] [address]   Phone: [phone]  Fax: [phone]      02/21/2009   ROGER FASNACHT [address] [place], [place]  [address]   Dear  Ms. Sharai Saxer,                            ____S.Drinkard,FNP   ____D. Gore,FNP       ____B. McPherson,MD   ____V. Rankins,MD    ____E. Mulberry,MD    __X__N. Daphine Deutscher, FNP  ____D. Reche Dixon, MD    ____K. Philipp Deputy, MD    ____Other     This letter is to inform you that your recent test(s):  _______Pap Smear    _______Lab Test     ___X____Barium Enema    _______ is within acceptable limits  _______ requires a medication change  _______ requires a follow-up lab visit  _______ requires a follow-up visit with your provider   Comments: Barium enema shows that you have diverticulosis.  See attached handout for more information.       _________________________________________________________ If you have any questions, please contact our office [phone].                    Sincerely,    Lehman Prom FNP [facility]

## 2010-08-25 NOTE — Assessment & Plan Note (Signed)
Summary: NEW - Establish Care   Vital Signs:  Patient profile:   45 year old female Menstrual status:  last cycle in 2005 Height:      59 inches Weight:      207 pounds BMI:     41.96 BSA:     1.87 Pulse rate:   80 / minute Pulse rhythm:   regular Resp:     20 per minute BP sitting:   120 / 90  (left arm) Cuff size:   regular  Vitals Entered By: Levon Hedger (October 18, 2008 3:15 PM) CC: Abdominal Pain Is Patient Diabetic? No Pain Assessment Patient in pain? no       Does patient need assistance? Functional Status Self care Ambulation Normal  years   days  Menstrual Status last cycle in 2005 Last PAP Result Done.   History of Present Illness:  Pt into the office to establish care. No PCP. Pt was at the Urgent care about 4 months ago for Gi issues  Social - separted with 4 children denies any tobacco, alcohol and drug use  1. Abd pain - s/p cholecystectomy pt is used to the diarrhea since her gallbladder removal however this pain is new. increasing over the past 2-3 months. -tobacco  -alcohol +red sauce use +caffiene Pt has been taking zantac for the symptoms which help some., however she has to take it several times per day.  Pt is employed at [employer].        Dyspepsia History:      There is a prior history of GERD.  The patient does not have a prior history of documented ulcer disease.  The dominant symptom is heartburn or acid reflux.  An H-2 blocker medication is not currently being taken.     Habits & Providers     Alcohol drinks/day: 0     Tobacco Status: never     Drug Use: never     Seat Belt Use: sometimes  Medications Prior to Update: 1)  None  Allergies (verified): No Known Drug Allergies  Past History:  Past Surgical History:    Cholecystectomy 2005    c-section x 4  Family History:    Anemia, Migraine headaches    lupus - daughter    breast cancer  - mother, sister  Social History:    separated     4  children    Her oldest child has schizophrenia.  Pt works at [employer].    tobacco - none    ETOH - none    drug - none    Smoking Status:  never    Drug Use:  never    Seat Belt Use:  sometimes  Review of Systems Resp:  Denies cough. GI:  Complains of abdominal pain and indigestion; denies nausea and vomiting. GU:  Denies abnormal vaginal bleeding.  Physical Exam  General:  alert.  obese Head:  normocephalic.   Mouth:  pharynx pink and moist.   Lungs:  normal breath sounds.   Heart:  normal rate and regular rhythm.   Abdomen:  midepigastric pain BS x 4 Msk:  up to the exam  Neurologic:  alert & oriented X3.   Skin:  color normal.   Psych:  Oriented X3.     Impression & Recommendations:  Problem # 1:  GERD (ICD-530.81) handout given spoke with pt about dx Her updated medication list for this problem includes:    Nexium 40 Mg Cpdr (Esomeprazole magnesium) .Marland Kitchen... 1 tablet  by mouth daily 30 mintues before breakfast  Complete Medication List: 1)  Nexium 40 Mg Cpdr (Esomeprazole magnesium) .Marland Kitchen.. 1 tablet by mouth daily 30 mintues before breakfast  Patient Instructions: 1)  Schedule an appointment for a complete physical exam. 2)  you will need to come fasting for labs. 3)  You will get pap smear, u/a, mammogram Prescriptions: NEXIUM 40 MG CPDR (ESOMEPRAZOLE MAGNESIUM) 1 tablet by mouth daily 30 mintues before breakfast  #30 x 3   Entered and Authorized by:   Lehman Prom FNP   Signed by:   Lehman Prom FNP on 10/18/2008   Method used:   Print then Give to Patient   RxID:   [id number]

## 2010-08-25 NOTE — Letter (Signed)
Summary: Handout Printed  Printed Handout:  - Bacterial Vaginosis (BV) 

## 2010-08-25 NOTE — Letter (Signed)
Summary: Lipid Letter  [facility]  [address] [place], [place] [address]   Phone: [phone]  Fax: [phone]    11/12/2009  Katie Woodard [address] [place], [place]  [address]  Dear Katie Woodard:  We have carefully reviewed your last lipid profile from 11/11/2009 and the results are noted below with a summary of recommendations for lipid management.    Cholesterol:       148     Goal: less than 200   HDL "good" Cholesterol:   39     Goal: greater than 40   LDL "bad" Cholesterol:   94     Goal: less than 100   Triglycerides:       74     Goal: less than 150    Labs done during your recent office visit were normal.    Current Medications: 1)    Aciphex 20 Mg Tbec (Rabeprazole sodium) .... Take one tablet twice daily * 2)    Maxalt 10 Mg Tabs (Rizatriptan benzoate) .... One tablet by mouth at onset of headache, may repeat in 2 hours if headache continues 3)    Ferrous Sulfate 325 (65 Fe) Mg Tabs (Ferrous sulfate) .... One tablet by mouth daily 4)    Ibuprofen 800 Mg Tabs (Ibuprofen) .... One tablet by mouth two times a day as needed for pain  If you have any questions, please call. We appreciate being able to work with you.   Sincerely,    [facility] Lehman Prom FNP

## 2010-12-11 NOTE — H&P (Signed)
[facility]  Patient:    Katie Woodard, Katie Woodard                       MRN: [id number] Adm. Date:  [id number] Attending:  Lorre Nick Dictator:   Patricia Pesa, M.D.                         History and Physical  CHIEF COMPLAINT:  Headache.  HISTORY OF PRESENT ILLNESS:  The patient is a 45 year old African-American female who presents to [facility] Emergency Department with a two day history of "the worse headache ever".  The patient describes the pain as being frontal, bilateral, sharp, and throbbing.  She denies any radiation.  She does complain of neck stiffness and diffuse pain throughout her spine and back. She denies chills.  She has had emesis x 3.  The pain began two days ago.  It got progressively worse overnight.  She complains of photophobia.  She states she has tension-type headaches approximately one day every week for the past three years that is relieved with Motrin.  She denies any exposure to ticks, no pets.  She did recently travel to [facility].  She states at that time she ate some chicken that made her sick.  PAST MEDICAL HISTORY:  Headaches x 2 years.  PAST SURGICAL HISTORY:  Cesarean section x 2.  MEDICATIONS:  Advil p.r.n.  ALLERGIES:  No known drug allergies.  SOCIAL HISTORY:  The patient lives in [place] with her husband and four children.  She does not smoke.  No alcohol and no IV drug abuse.  She works at [employer].  FAMILY HISTORY:  The patients mother has migraine headaches.  REVIEW OF SYSTEMS:  No weight loss, no chest pain, no shortness of breath. She has had nausea and vomiting, no skin rash.  She does have back pain and dysuria without frequency.  No joint pain.  PHYSICAL EXAMINATION:  VITAL SIGNS:  Temperature 100.1, blood pressure 122/71, respiratory rate 20.  GENERAL:  Ill-appearing female who is laying very still on the hospital stretcher.  The patient is alert and oriented x 3.  HEENT:   PERRLA.  EOMI.  Mucus membranes tacky.  NECK:  Diffusely tender posteriorly.  CARDIAC:  S1 and S2, regular rate and rhythm.  LUNGS:  Clear to auscultation.  ABDOMEN:  Soft, nontender, nondistended with bowel sounds.  BACK:  Diffusely tender both along the spine and paraspinous muscle groups. No focal tenderness.  SKIN:  No rash.  NEUROLOGIC:  Cranial nerves II-XII grossly intact.  Fundus examination reveals sharp discs.  The patient has a positive ______ sign and positive ______ sign.  LABORATORY DATA:  White blood cell count 9.6, hemoglobin 10.2, sodium 141, potassium 3.7, glucose 100.  CSF reveals white blood cells 212, red blood cells 5, protein 80, glucose 42.  CT of the head without contrast is normal. Urine pregnancy is negative.  ASSESSMENT AND PLAN:  This is a 45 year old female who describes the worst headache of her life.  Also has fever and abnormal CSF. 1. Headache with fever.  Meningitis is likely, given the patients gram stain    we will treat with Rocephin for bacteria etiology.  We will await gram    stain and culture results.  Viral meningitis is also possible in this    patient.  There is no evidence of [facility] spotted fever, although a    typical migraine  may be considered.  I will give Toradol, Tylenol No. 3 for    pain and fever.  We will consider adding vancomycin and dexamethasone if    bacterial source is confirmed. 2. Fluids, electrolytes, and nutrition.  The patient is given intravenous    fluid bolus.  We will continue maintenance intravenous fluids with the    history of emesis. DD:  01/25/00 TD:  01/25/00 Job: [id number]

## 2010-12-11 NOTE — Discharge Summary (Signed)
[place]  Patient:    Katie Woodard, Katie Woodard                       MRN: [id number] Adm. Date:  [id number] Disc. Date: [id number] Attending:  Garnette Scheuermann Dictator:   Celedonio Savage, M.D.                           Discharge Summary  SERVICE:  Family practice teaching service.  RESIDENT:  Celedonio Savage, M.D.  HISTORY OF PRESENT ILLNESS:  The patient is a 45 year old African/American female who presented to the [facility] Emergency Department with a two-day history of "the worst headache ever".  The patient described the pain as being frontal, bilateral, sharp, throbbing.  She also complained of neck stiffness and diffuse pain throughout her spine and back.  She had had emesis times three and photophobia.  PAST MEDICAL HISTORY:  Tension-type headaches approximately one day a week for the past three years that is relieved with Motrin.  PHYSICAL EXAMINATION: VITAL SIGNS:  On physical examination, the patient had a temperature of 100.1, blood pressure 122/71 and a respiratory rate of 20. GENERAL:  In general, she appeared ill and was lying very still on the hospital stretcher. HEENT:  Pupils were equally round and reactive to light and accommodation. Extraocular movements intact.  Mucous membranes were tacky. NECK:  Her neck was diffusely tender posteriorly. LUNGS:  Clear. ABDOMEN:  Soft, nontender, nondistended with bowel sounds. BACK:  Diffusely tender, both along the spine and paraspinous muscle groups with no focal tenderness. NEUROLOGIC:  Cranial nerves II-XII are grossly intact.  Fundus examination revealed sharp discs.  Patient had positive Kernig and Babinski signs.  LABS:  White blood cell count of 9.6, hemoglobin of 10.2, sodium of 141, potassium 3.7 and glucose of 100.  CSF revealed white blood cells 212, red blood cells 5, protein 80, glucose 42.  Urine pregnancy test was negative.  X-RAYS:  CT of the head  without contrast was normal.  HOSPITAL COURSE:  In the emergency room, the patient received her first dose of Rocephin 2 gm IV.  She was admitted to the intermediate care unit in fair condition with a possible diagnosis of viral meningitis.  She received Tylenol #3 and Toradol 30 mg IV q.6h. for pain.  She was also noted to have some tremors of her left hand and was started on Ativan 0.5 mg IV q.8h.  She also received Rocephin 2 gm IV q.12h.  Her pain improved overnight.  She was able to move her head from side-to-side, as well as flex and extend the neck.  She complained of mild nausea, but had no more vomiting.  She had had blurry vision on the night of admission, but this resolved on day 2.  She was noted to have a low hemoglobin on January 26, 2000 of 8.3 with a hematocrit of 28.5. and an iron panel was drawn which revealed her to have iron-deficiency anemia.  Ferrous sulfate 325 mg b.i.d. was started at that time.  Her pain and vision continued to improve, although she still complained of a headache.  She had full range of motion of her neck.  On the day of discharge, she had been afebrile for 24 hours with other vital signs stable.  Blood and urine cultures have been negative times three days.  Hemoglobin has risen to 8.9.  DISPOSITION:  To home  in good condition.  MEDICATIONS: 1.   Cataflam 50 mg, one q.8h., p.r.n. headache or back pain. 2.   Ferrous sulfate 325 mg b.i.d. with food.  She can return to work as soon as she feels well.  She was instructed to call or return to the hospital if her fever increases or her pain becomes severe. She has a follow up appointment made with Dr. Wilson Singer at [facility] for February 05, 2000 at 2:45 p.m. DD:  01/28/00 TD:  01/28/00 Job: [id number]

## 2010-12-11 NOTE — Op Note (Signed)
NAMEFIORA, WEILL              ACCOUNT NO.:  [id number]   MEDICAL RECORD NO.:  [id number]          PATIENT TYPE:  AMB   LOCATION:  DAY                          FACILITY:  [facility]   PHYSICIAN:  Gita Kudo, M.D. DATE OF BIRTH:  01/22/1966   DATE OF PROCEDURE:  09/22/2004  DATE OF DISCHARGE:                                 OPERATIVE REPORT   PREOPERATIVE DIAGNOSIS:  Abnormal gallbladder - sludge, stones, low hepato-  iminodiacetic acid scan ejection fraction.   POSTOPERATIVE DIAGNOSIS:  Abnormal gallbladder - sludge, stones, low hepato-  iminodiacetic acid scan ejection fraction.   PROCEDURE:  Laparoscopic cholecystectomy with intraoperative cholangiogram.   SURGEON:  Gita Kudo, M.D.   ASSISTANT:  Lebron Conners, M.D.   ANESTHESIA:  General endotracheal.   INDICATIONS FOR PROCEDURE:  Abdominal pain, abnormal ultrasound, liver  function studies normal.   FINDINGS:  The gallbladder was thin-walled.  The cystic duct was small, and  the cholangiogram looked normal.   DESCRIPTION OF PROCEDURE:  Under satisfactory general endotracheal  anesthesia, having received 1.0 g IV Ancef, the patient's abdomen was  prepped and draped in the standard fashion.  A total of 30 cc of 0.5%  Marcaine with epinephrine was infiltrated at the skin incision sites for  postoperative analgesia.   A transverse incision made above the umbilicus, the midline opened into the  peritoneum.  Controlled with a figure-of-eight 0 Vicryl suture and operating  Hasson port inserted and secured.  Good CO2 pneumoperitoneum established and  camera placed.  Then, under direct vision, two #5 ports placed laterally and  a second #10 medially.  With lateral port graspers giving excellent  exposure, I operated through the medial port and circumferentially dissected  the cystic duct and artery.  When certain of the anatomy, multiple clips  were placed on the artery, and it was divided.  A single clip placed on  the  cystic duct near the gallbladder.  An incision made in the duct and a  percutaneous catheter placed into it and a good cholangiogram obtained.  The  catheter withdrawn and the duct controlled with multiple clips and divided.  A posterior branch of the artery was then dissected, controlled with clips,  and divided and then the gallbladder removed from the liver bed from below  upward with cautery for both hemostasis and dissection.  Then, the  gallbladder site was checked for hemostasis which was good.  Because of a  small hole in the gallbladder, it was placed in an EndoCatch bag, and a  small amount of clear bile was suctioned out of the abdomen and the abdomen  lavaged with saline and the returns were dry.  The camera then moved to the  upper port and the gallbladder extracted through the umbilicus intact in the  bag without spillage or problem.   A lower abdomen single adhesion to the abdominal wall was identified and  then taken down with the cautery.  Following this, the abdomen was checked  for hemostasis, again lavaged and suctioned dry, and then all CO2 and  ports were released.  The midline closed  with the previous figure-of-eight  and a second interrupted 0 Vicryl.  The subcutaneous tissue was approximated  with 4-0 Vicryl and then Steri-Strips used to approximate the skin.  There  were no complications.  The sponge and needle counts were correct.      MRL/MEDQ  D:  09/22/2004  T:  09/22/2004  Job:  [id number]   cc:   Della Goo, M.D.  [address] [place]  [place] [address]  Fax: [phone]

## 2011-04-15 LAB — POCT URINALYSIS DIP (DEVICE)
Glucose, UA: 250 — AB
Ketones, ur: 15 — AB
Nitrite: POSITIVE — AB
Operator id: 116391
Protein, ur: >=300 — AB
Specific Gravity, Urine: 1.015
Urobilinogen, UA: >=8
pH: 5.5

## 2011-04-15 LAB — HERPES SIMPLEX VIRUS CULTURE: Culture: NOT DETECTED

## 2011-04-15 LAB — WET PREP, GENITAL
Trich, Wet Prep: NONE SEEN
WBC, Wet Prep HPF POC: NONE SEEN
Yeast Wet Prep HPF POC: NONE SEEN

## 2011-04-15 LAB — GC/CHLAMYDIA PROBE AMP, GENITAL
Chlamydia, DNA Probe: NEGATIVE
GC Probe Amp, Genital: NEGATIVE

## 2011-04-29 LAB — POCT URINALYSIS DIP (DEVICE)
Bilirubin Urine: NEGATIVE
Glucose, UA: NEGATIVE mg/dL
Ketones, ur: NEGATIVE mg/dL
Nitrite: POSITIVE — AB
Protein, ur: 100 mg/dL — AB
Specific Gravity, Urine: 1.025 (ref 1.005–1.030)
Urobilinogen, UA: 0.2 mg/dL (ref 0.0–1.0)
pH: 6.5 (ref 5.0–8.0)

## 2011-04-29 LAB — DIFFERENTIAL
Basophils Absolute: 0 10*3/uL (ref 0.0–0.1)
Basophils Relative: 0 % (ref 0–1)
Eosinophils Absolute: 0 10*3/uL (ref 0.0–0.7)
Eosinophils Relative: 0 % (ref 0–5)
Lymphocytes Relative: 29 % (ref 12–46)
Lymphs Abs: 3 10*3/uL (ref 0.7–4.0)
Monocytes Absolute: 0.4 10*3/uL (ref 0.1–1.0)
Monocytes Relative: 4 % (ref 3–12)
Neutro Abs: 7 10*3/uL (ref 1.7–7.7)
Neutrophils Relative %: 67 % (ref 43–77)

## 2011-04-29 LAB — CBC
HCT: 36.5 % (ref 36.0–46.0)
Hemoglobin: 11.9 g/dL — ABNORMAL LOW (ref 12.0–15.0)
MCHC: 32.5 g/dL (ref 30.0–36.0)
MCV: 67.2 fL — ABNORMAL LOW (ref 78.0–100.0)
Platelets: 330 10*3/uL (ref 150–400)
RBC: 5.43 MIL/uL — ABNORMAL HIGH (ref 3.87–5.11)
RDW: 17.3 % — ABNORMAL HIGH (ref 11.5–15.5)
WBC: 10.4 10*3/uL (ref 4.0–10.5)

## 2011-04-29 LAB — POCT I-STAT, CHEM 8
BUN: 14 mg/dL (ref 6–23)
Calcium, Ion: 1.13 mmol/L (ref 1.12–1.32)
Chloride: 106 mEq/L (ref 96–112)
Creatinine, Ser: 0.6 mg/dL (ref 0.4–1.2)
Glucose, Bld: 85 mg/dL (ref 70–99)
HCT: 40 % (ref 36.0–46.0)
Hemoglobin: 13.6 g/dL (ref 12.0–15.0)
Potassium: 3.3 mEq/L — ABNORMAL LOW (ref 3.5–5.1)
Sodium: 140 mEq/L (ref 135–145)
TCO2: 24 mmol/L (ref 0–100)

## 2011-04-29 LAB — POCT PREGNANCY, URINE: Preg Test, Ur: NEGATIVE

## 2011-04-29 LAB — OCCULT BLOOD X 1 CARD TO LAB, STOOL: Fecal Occult Bld: POSITIVE

## 2011-05-13 LAB — I-STAT 8, (EC8 V) (CONVERTED LAB)
Acid-Base Excess: 1
BUN: 12
Bicarbonate: 27.5 — ABNORMAL HIGH
Chloride: 105
Glucose, Bld: 82
HCT: 42
Hemoglobin: 14.3
Operator id: 200941
Potassium: 3.6
Sodium: 140
TCO2: 29
pCO2, Ven: 48.1
pH, Ven: 7.366 — ABNORMAL HIGH

## 2011-05-13 LAB — STOOL CULTURE

## 2011-05-13 LAB — GIARDIA/CRYPTOSPORIDIUM SCREEN(EIA)
Cryptosporidium Screen (EIA): NEGATIVE
Giardia Screen - EIA: NEGATIVE

## 2011-05-13 LAB — CLOSTRIDIUM DIFFICILE EIA: C difficile Toxins A+B, EIA: NEGATIVE

## 2011-05-13 LAB — POCT I-STAT CREATININE
Creatinine, Ser: 0.7
Operator id: 200941

## 2011-05-13 LAB — OCCULT BLOOD X 1 CARD TO LAB, STOOL: Fecal Occult Bld: NEGATIVE

## 2011-08-08 ENCOUNTER — Encounter (HOSPITAL_COMMUNITY): Payer: Self-pay | Admitting: *Deleted

## 2011-08-08 ENCOUNTER — Emergency Department (HOSPITAL_COMMUNITY)
Admission: EM | Admit: 2011-08-08 | Discharge: 2011-08-08 | Disposition: A | Discharge status: Home / Self Care | Payer: No Typology Code available for payment source | Attending: Emergency Medicine | Admitting: Emergency Medicine

## 2011-08-08 DIAGNOSIS — M25519 Pain in unspecified shoulder: Secondary | ICD-10-CM | POA: Insufficient documentation

## 2011-08-08 DIAGNOSIS — S46911A Strain of unspecified muscle, fascia and tendon at shoulder and upper arm level, right arm, initial encounter: Secondary | ICD-10-CM

## 2011-08-08 DIAGNOSIS — S46912A Strain of unspecified muscle, fascia and tendon at shoulder and upper arm level, left arm, initial encounter: Secondary | ICD-10-CM

## 2011-08-08 DIAGNOSIS — IMO0002 Reserved for concepts with insufficient information to code with codable children: Secondary | ICD-10-CM | POA: Insufficient documentation

## 2011-08-08 MED ORDER — TRAMADOL HCL 50 MG PO TABS: 50.0000 mg | ORAL_TABLET | Freq: Four times a day (QID) | ORAL | Status: AC | PRN

## 2011-08-08 MED ORDER — METHOCARBAMOL 500 MG PO TABS: 500.0000 mg | ORAL_TABLET | Freq: Two times a day (BID) | ORAL | Status: AC
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
## 2011-08-09 NOTE — ED Provider Notes (Signed)
Medical screening examination/treatment/procedure(s) were performed by non-physician practitioner and as supervising physician I was immediately available for consultation/collaboration.  Suzi Roots, MD 08/09/11 1052

## 2011-12-27 ENCOUNTER — Emergency Department (INDEPENDENT_AMBULATORY_CARE_PROVIDER_SITE_OTHER)
Admission: EM | Admit: 2011-12-27 | Discharge: 2011-12-27 | Disposition: A | Discharge status: Home / Self Care | Payer: Medicaid Other | Source: Home / Self Care | Attending: Emergency Medicine | Admitting: Emergency Medicine

## 2011-12-27 ENCOUNTER — Encounter (HOSPITAL_COMMUNITY): Payer: Self-pay

## 2011-12-27 DIAGNOSIS — K5792 Diverticulitis of intestine, part unspecified, without perforation or abscess without bleeding: Secondary | ICD-10-CM

## 2011-12-27 DIAGNOSIS — R55 Syncope and collapse: Secondary | ICD-10-CM

## 2011-12-27 DIAGNOSIS — K5732 Diverticulitis of large intestine without perforation or abscess without bleeding: Secondary | ICD-10-CM

## 2011-12-27 LAB — COMPREHENSIVE METABOLIC PANEL
ALT: 15 U/L (ref 0–35)
AST: 12 U/L (ref 0–37)
Albumin: 4.1 g/dL (ref 3.5–5.2)
Alkaline Phosphatase: 63 U/L (ref 39–117)
BUN: 8 mg/dL (ref 6–23)
CO2: 25 mEq/L (ref 19–32)
Calcium: 9.7 mg/dL (ref 8.4–10.5)
Chloride: 101 mEq/L (ref 96–112)
Creatinine, Ser: 0.48 mg/dL — ABNORMAL LOW (ref 0.50–1.10)
GFR calc Af Amer: >90 mL/min (ref >90)
GFR calc non Af Amer: >90 mL/min (ref >90)
Glucose, Bld: 89 mg/dL (ref 70–99)
Potassium: 3.4 mEq/L — ABNORMAL LOW (ref 3.5–5.1)
Sodium: 139 mEq/L (ref 135–145)
Total Bilirubin: 0.2 mg/dL — ABNORMAL LOW (ref 0.3–1.2)
Total Protein: 8 g/dL (ref 6.0–8.3)

## 2011-12-27 LAB — DIFFERENTIAL
Basophils Absolute: 0 10*3/uL (ref 0.0–0.1)
Basophils Relative: 0 % (ref 0–1)
Eosinophils Absolute: 0.1 10*3/uL (ref 0.0–0.7)
Eosinophils Relative: 1 % (ref 0–5)
Lymphocytes Relative: 27 % (ref 12–46)
Lymphs Abs: 2.8 10*3/uL (ref 0.7–4.0)
Monocytes Absolute: 0.5 10*3/uL (ref 0.1–1.0)
Monocytes Relative: 5 % (ref 3–12)
Neutro Abs: 7.1 10*3/uL (ref 1.7–7.7)
Neutrophils Relative %: 67 % (ref 43–77)

## 2011-12-27 LAB — CBC
HCT: 37 % (ref 36.0–46.0)
Hemoglobin: 12.2 g/dL (ref 12.0–15.0)
MCH: 22.2 pg — ABNORMAL LOW (ref 26.0–34.0)
MCHC: 33 g/dL (ref 30.0–36.0)
MCV: 67.4 fL — ABNORMAL LOW (ref 78.0–100.0)
Platelets: 294 10*3/uL (ref 150–400)
RBC: 5.49 MIL/uL — ABNORMAL HIGH (ref 3.87–5.11)
RDW: 16.7 % — ABNORMAL HIGH (ref 11.5–15.5)
WBC: 10.5 10*3/uL (ref 4.0–10.5)

## 2011-12-27 LAB — LIPASE, BLOOD: Lipase: 23 U/L (ref 11–59)

## 2011-12-27 MED ORDER — CIPROFLOXACIN HCL 500 MG PO TABS: 500.0000 mg | ORAL_TABLET | Freq: Two times a day (BID) | ORAL | Status: AC

## 2011-12-27 MED ORDER — METRONIDAZOLE 500 MG PO TABS: 500.0000 mg | ORAL_TABLET | Freq: Two times a day (BID) | ORAL | Status: AC

## 2011-12-27 MED ORDER — TRAMADOL HCL 50 MG PO TABS: 100.0000 mg | ORAL_TABLET | Freq: Three times a day (TID) | ORAL | Status: AC | PRN

## 2011-12-27 NOTE — ED Notes (Signed)
C/o upper abdominal pain x 1 week; prior hist of diverticular issues; has  Been having n/v/d , "feels like diverticulitis again"

## 2011-12-27 NOTE — Discharge Instructions (Signed)
Diverticulitis A diverticulum is a small pouch or sac on the colon. Diverticulosis is the presence of these diverticula on the colon. Diverticulitis is the irritation (inflammation) or infection of diverticula. CAUSES  The colon and its diverticula contain bacteria. If food particles block the tiny opening to a diverticulum, the bacteria inside can grow and cause an increase in pressure. This leads to infection and inflammation and is called diverticulitis. SYMPTOMS   Abdominal pain and tenderness. Usually, the pain is located on the left side of your abdomen. However, it could be located elsewhere.   Fever.   Bloating.   Feeling sick to your stomach (nausea).   Throwing up (vomiting).   Abnormal stools.  DIAGNOSIS  Your caregiver will take a history and perform a physical exam. Since many things can cause abdominal pain, other tests may be necessary. Tests may include:  Blood tests.   Urine tests.   X-ray of the abdomen.   CT scan of the abdomen.  Sometimes, surgery is needed to determine if diverticulitis or other conditions are causing your symptoms. TREATMENT  Most of the time, you can be treated without surgery. Treatment includes:  Resting the bowels by only having liquids for a few days. As you improve, you will need to eat a low-fiber diet.   Intravenous (IV) fluids if you are losing body fluids (dehydrated).   Antibiotic medicines that treat infections may be given.   Pain and nausea medicine, if needed.   Surgery if the inflamed diverticulum has burst.  HOME CARE INSTRUCTIONS   Try a clear liquid diet (broth, tea, or water for as long as directed by your caregiver). You may then gradually begin a low-fiber diet as tolerated. A low-fiber diet is a diet with less than 10 grams of fiber. Choose the foods below to reduce fiber in the diet:   White breads, cereals, rice, and pasta.   Cooked fruits and vegetables or soft fresh fruits and vegetables without the skin.     Ground or well-cooked tender beef, ham, veal, lamb, pork, or poultry.   Eggs and seafood.   After your diverticulitis symptoms have improved, your caregiver may put you on a high-fiber diet. A high-fiber diet includes 14 grams of fiber for every 1000 calories consumed. For a standard 2000 calorie diet, you would need 28 grams of fiber. Follow these diet guidelines to help you increase the fiber in your diet. It is important to slowly increase the amount fiber in your diet to avoid gas, constipation, and bloating.   Choose whole-grain breads, cereals, pasta, and brown rice.   Choose fresh fruits and vegetables with the skin on. Do not overcook vegetables because the more vegetables are cooked, the more fiber is lost.   Choose more nuts, seeds, legumes, dried peas, beans, and lentils.   Look for food products that have greater than 3 grams of fiber per serving on the Nutrition Facts label.   Take all medicine as directed by your caregiver.   If your caregiver has given you a follow-up appointment, it is very important that you go. Not going could result in lasting (chronic) or permanent injury, pain, and disability. If there is any problem keeping the appointment, call to reschedule.  SEEK MEDICAL CARE IF:   Your pain does not improve.   You have a hard time advancing your diet beyond clear liquids.   Your bowel movements do not return to normal.  SEEK IMMEDIATE MEDICAL CARE IF:   Your pain becomes   worse.   You have an oral temperature above 102 F (38.9 C), not controlled by medicine.   You have repeated vomiting.   You have bloody or black, tarry stools.   Symptoms that brought you to your caregiver become worse or are not getting better.  MAKE SURE YOU:   Understand these instructions.   Will watch your condition.   Will get help right away if you are not doing well or get worse.  Document Released: 04/21/2005 Document Revised: 07/01/2011 Document Reviewed:  08/17/2010 [employer] Patient Information 2012 [employer]. 

## 2011-12-27 NOTE — ED Provider Notes (Signed)
Chief Complaint  Patient presents with  . Abdominal Pain    History of Present Illness:   The patient is a 46 year old female who has had a one-week history of intermittent abdominal pain. The pain comes and goes and lasts from 7-8 minutes at a time. It is located in the right upper quadrant and epigastrium with radiation up into the chest. It's worse after eating particularly greasy or spicy foods and better with Tylenol or with TUMS. She's felt nauseated and has vomited a couple of times, especially after meals. She's had diarrheal stools, up to 8 per day without blood or mucus. There's been no blood in the vomitus and no bilious vomitus. She has a history of diverticulitis and is status post cholecystectomy. She also has some pain in her right shoulder and yesterday she felt like she has some trouble breathing. She denies any coughing, wheezing, sputum production, or history of asthma. Today she felt dizzy and passed out for about a minute. She denies any chest pain, tightness, pressure, palpitations, or previous history of syncope. She denies any history of cardiac problems.  Review of Systems:  Other than noted above, the patient denies any of the following symptoms: Constitutional:  No fever, chills, fatigue, weight loss or anorexia. Lungs:  No cough or shortness of breath. Heart:  No chest pain, palpitations, syncope or edema.  No cardiac history. Abdomen:  No nausea, vomiting, hematememesis, melena, diarrhea, or hematochezia. GU:  No dysuria, frequency, urgency, or hematuria. Gyn:  No vaginal discharge, itching, abnormal bleeding, dyspareunia, or pelvic pain. Skin:  No rash or itching.  PMFSH:  Past medical history, family history, social history, meds, and allergies were reviewed.  No prior abdominal surgeries, past history of GI problems, STDs or GYN problems.  No history of aspirin or NSAID use.  No excessive alcohol intake.  Physical Exam:   Vital signs:  BP 141/95  Pulse 78   Temp(Src) 98.9 F (37.2 C) (Oral)  Resp 16  SpO2 100% Filed Vitals:   12/27/11 1227 12/27/11 1320 12/27/11 1321 12/27/11 1322  BP: 146/99 146/73 Supine  138/79 Sitting  141/95 Standing   Pulse: 71 63 59 78  Temp: 98.9 F (37.2 C)     TempSrc: Oral     Resp: 16     SpO2: 100%       Gen:  Alert, oriented, in no distress. Lungs:  Breath sounds clear and equal bilaterally.  No wheezes, rales or rhonchi. Heart:  Regular rhythm.  No gallops or murmers.   Abdomen:  Abdomen is soft, flat, nondistended. She has slight pain to palpation in the epigastrium and the right upper quadrant without guarding or rebound. Murphy sign and Murphy's punch were negative. Bowel sounds normally active. No organomegaly or mass. Skin:  Clear, warm and dry.  No rash.  Labs:   Results for orders placed during the hospital encounter of 12/27/11  CBC      Component Value Range   WBC 10.5  4.0 - 10.5 (K/uL)   RBC 5.49 (*) 3.87 - 5.11 (MIL/uL)   Hemoglobin 12.2  12.0 - 15.0 (g/dL)   HCT 08.6  57.8 - 46.9 (%)   MCV 67.4 (*) 78.0 - 100.0 (fL)   MCH 22.2 (*) 26.0 - 34.0 (pg)   MCHC 33.0  30.0 - 36.0 (g/dL)   RDW 62.9 (*) 52.8 - 15.5 (%)   Platelets 294  150 - 400 (K/uL)  DIFFERENTIAL      Component Value Range  Neutrophils Relative 67  43 - 77 (%)   Lymphocytes Relative 27  12 - 46 (%)   Monocytes Relative 5  3 - 12 (%)   Eosinophils Relative 1  0 - 5 (%)   Basophils Relative 0  0 - 1 (%)   Neutro Abs 7.1  1.7 - 7.7 (K/uL)   Lymphs Abs 2.8  0.7 - 4.0 (K/uL)   Monocytes Absolute 0.5  0.1 - 1.0 (K/uL)   Eosinophils Absolute 0.1  0.0 - 0.7 (K/uL)   Basophils Absolute 0.0  0.0 - 0.1 (K/uL)   RBC Morphology POLYCHROMASIA PRESENT    COMPREHENSIVE METABOLIC PANEL      Component Value Range   Sodium 139  135 - 145 (mEq/L)   Potassium 3.4 (*) 3.5 - 5.1 (mEq/L)   Chloride 101  96 - 112 (mEq/L)   CO2 25  19 - 32 (mEq/L)   Glucose, Bld 89  70 - 99 (mg/dL)   BUN 8  6 - 23 (mg/dL)   Creatinine, Ser 1.61 (*)  0.50 - 1.10 (mg/dL)   Calcium 9.7  8.4 - 09.6 (mg/dL)   Total Protein 8.0  6.0 - 8.3 (g/dL)   Albumin 4.1  3.5 - 5.2 (g/dL)   AST 12  0 - 37 (U/L)   ALT 15  0 - 35 (U/L)   Alkaline Phosphatase 63  39 - 117 (U/L)   Total Bilirubin 0.2 (*) 0.3 - 1.2 (mg/dL)   GFR calc non Af Amer >90  >90 (mL/min)   GFR calc Af Amer >90  >90 (mL/min)  LIPASE, BLOOD      Component Value Range   Lipase 23  11 - 59 (U/L)     Date: 12/27/2011  Rate: 63  Rhythm: normal sinus rhythm  QRS Axis: normal  Intervals: normal  ST/T Wave abnormalities: normal  Conduction Disutrbances:none  Narrative Interpretation: Normal sinus rhythm with sinus arrhythmias and nonspecific T wave abnormalities, although to me her T waves look fine. She does have some flat T waves in the lateral precordial leads, but nothing that would suggest ischemia and nothing to explain her episode of syncope.  Old EKG Reviewed: unchanged  Assessment:  The primary encounter diagnosis was Diverticulitis. A diagnosis of Syncope was also pertinent to this visit.  Plan:   1.  The following meds were prescribed:   New Prescriptions   CIPROFLOXACIN (CIPRO) 500 MG TABLET    Take 1 tablet (500 mg total) by mouth every 12 (twelve) hours.   METRONIDAZOLE (FLAGYL) 500 MG TABLET    Take 1 tablet (500 mg total) by mouth 2 (two) times daily.   TRAMADOL (ULTRAM) 50 MG TABLET    Take 2 tablets (100 mg total) by mouth every 8 (eight) hours as needed for pain.   2.  The patient was instructed in symptomatic care and handouts were given. 3.  The patient was told to return if becoming worse in any way, if no better in 3 or 4 days, and given some red flag symptoms that would indicate earlier return. I suggested she followup with her primary care physician in about a week, and if she should get worse or fail to improve over the next 3-4 days, to go directly to the hospital emergency room.    Reuben Likes, MD 12/27/11 [phone]

## 2013-08-14 ENCOUNTER — Ambulatory Visit: Payer: Medicaid Other

## 2013-12-20 ENCOUNTER — Ambulatory Visit: Payer: Medicaid Other

## 2014-01-31 ENCOUNTER — Encounter (HOSPITAL_COMMUNITY): Payer: Self-pay | Admitting: Emergency Medicine

## 2014-01-31 ENCOUNTER — Observation Stay (HOSPITAL_COMMUNITY)
Admission: EM | Admit: 2014-01-31 | Discharge: 2014-02-01 | Disposition: A | Discharge status: Home / Self Care | Payer: Self-pay | Attending: Surgery | Admitting: Surgery

## 2014-01-31 ENCOUNTER — Observation Stay (HOSPITAL_COMMUNITY): Payer: Medicaid Other | Admitting: Anesthesiology

## 2014-01-31 ENCOUNTER — Emergency Department (HOSPITAL_COMMUNITY): Payer: Self-pay

## 2014-01-31 ENCOUNTER — Encounter (HOSPITAL_COMMUNITY)
Admission: EM | Disposition: A | Discharge status: Home / Self Care | Payer: Self-pay | Source: Home / Self Care | Attending: Emergency Medicine

## 2014-01-31 ENCOUNTER — Encounter (HOSPITAL_COMMUNITY): Payer: Self-pay | Admitting: Anesthesiology

## 2014-01-31 DIAGNOSIS — R1031 Right lower quadrant pain: Secondary | ICD-10-CM

## 2014-01-31 DIAGNOSIS — M5030 Other cervical disc degeneration, unspecified cervical region: Secondary | ICD-10-CM | POA: Insufficient documentation

## 2014-01-31 DIAGNOSIS — K3580 Unspecified acute appendicitis: Secondary | ICD-10-CM

## 2014-01-31 DIAGNOSIS — R109 Unspecified abdominal pain: Secondary | ICD-10-CM | POA: Diagnosis present

## 2014-01-31 DIAGNOSIS — Z6841 Body Mass Index (BMI) 40.0 and over, adult: Secondary | ICD-10-CM | POA: Insufficient documentation

## 2014-01-31 DIAGNOSIS — G8929 Other chronic pain: Secondary | ICD-10-CM | POA: Diagnosis present

## 2014-01-31 DIAGNOSIS — K219 Gastro-esophageal reflux disease without esophagitis: Secondary | ICD-10-CM | POA: Insufficient documentation

## 2014-01-31 DIAGNOSIS — K5730 Diverticulosis of large intestine without perforation or abscess without bleeding: Secondary | ICD-10-CM | POA: Insufficient documentation

## 2014-01-31 DIAGNOSIS — R112 Nausea with vomiting, unspecified: Secondary | ICD-10-CM

## 2014-01-31 DIAGNOSIS — R197 Diarrhea, unspecified: Secondary | ICD-10-CM

## 2014-01-31 DIAGNOSIS — R51 Headache: Secondary | ICD-10-CM | POA: Insufficient documentation

## 2014-01-31 HISTORY — DX: Unspecified ovarian cyst, unspecified side: N83.209

## 2014-01-31 HISTORY — DX: Other cervical disc degeneration, unspecified cervical region: M50.30

## 2014-01-31 HISTORY — DX: Headache, unspecified: R51.9

## 2014-01-31 HISTORY — DX: Gastro-esophageal reflux disease without esophagitis: K21.9

## 2014-01-31 HISTORY — DX: Headache: R51

## 2014-01-31 HISTORY — DX: Leiomyoma of uterus, unspecified: D25.9

## 2014-01-31 HISTORY — PX: LAPAROSCOPIC APPENDECTOMY: SHX408

## 2014-01-31 HISTORY — DX: Other chronic pain: G89.29

## 2014-01-31 HISTORY — DX: Diverticulitis of intestine, part unspecified, without perforation or abscess without bleeding: K57.92

## 2014-01-31 LAB — COMPREHENSIVE METABOLIC PANEL
ALT: 14 U/L (ref 0–35)
AST: 11 U/L (ref 0–37)
Albumin: 4 g/dL (ref 3.5–5.2)
Alkaline Phosphatase: 68 U/L (ref 39–117)
Anion gap: 17 — ABNORMAL HIGH (ref 5–15)
BUN: 10 mg/dL (ref 6–23)
CO2: 23 mEq/L (ref 19–32)
Calcium: 9.2 mg/dL (ref 8.4–10.5)
Chloride: 101 mEq/L (ref 96–112)
Creatinine, Ser: 0.42 mg/dL — ABNORMAL LOW (ref 0.50–1.10)
GFR calc Af Amer: >90 mL/min (ref >90)
GFR calc non Af Amer: >90 mL/min (ref >90)
Glucose, Bld: 94 mg/dL (ref 70–99)
Potassium: 3.4 mEq/L — ABNORMAL LOW (ref 3.7–5.3)
Sodium: 141 mEq/L (ref 137–147)
Total Bilirubin: 0.4 mg/dL (ref 0.3–1.2)
Total Protein: 8.2 g/dL (ref 6.0–8.3)

## 2014-01-31 LAB — CBC WITH DIFFERENTIAL/PLATELET
Basophils Absolute: 0 10*3/uL (ref 0.0–0.1)
Basophils Relative: 0 % (ref 0–1)
Eosinophils Absolute: 0 10*3/uL (ref 0.0–0.7)
Eosinophils Relative: 0 % (ref 0–5)
HCT: 37.3 % (ref 36.0–46.0)
Hemoglobin: 11.9 g/dL — ABNORMAL LOW (ref 12.0–15.0)
Lymphocytes Relative: 16 % (ref 12–46)
Lymphs Abs: 1.4 10*3/uL (ref 0.7–4.0)
MCH: 22 pg — ABNORMAL LOW (ref 26.0–34.0)
MCHC: 31.9 g/dL (ref 30.0–36.0)
MCV: 68.8 fL — ABNORMAL LOW (ref 78.0–100.0)
Monocytes Absolute: 0.5 10*3/uL (ref 0.1–1.0)
Monocytes Relative: 6 % (ref 3–12)
Neutro Abs: 7.1 10*3/uL (ref 1.7–7.7)
Neutrophils Relative %: 78 % — ABNORMAL HIGH (ref 43–77)
Platelets: 276 10*3/uL (ref 150–400)
RBC: 5.42 MIL/uL — ABNORMAL HIGH (ref 3.87–5.11)
RDW: 16.8 % — ABNORMAL HIGH (ref 11.5–15.5)
WBC: 9 10*3/uL (ref 4.0–10.5)

## 2014-01-31 LAB — URINALYSIS, ROUTINE W REFLEX MICROSCOPIC
Bilirubin Urine: NEGATIVE
Glucose, UA: NEGATIVE mg/dL
Ketones, ur: NEGATIVE mg/dL
Nitrite: NEGATIVE
Protein, ur: 30 mg/dL — AB
Specific Gravity, Urine: 1.019 (ref 1.005–1.030)
Urobilinogen, UA: 1 mg/dL (ref 0.0–1.0)
pH: 6 (ref 5.0–8.0)

## 2014-01-31 LAB — URINE MICROSCOPIC-ADD ON

## 2014-01-31 LAB — PREGNANCY, URINE: Preg Test, Ur: NEGATIVE

## 2014-01-31 LAB — LIPASE, BLOOD: Lipase: 17 U/L (ref 11–59)

## 2014-01-31 SURGERY — APPENDECTOMY, LAPAROSCOPIC
Anesthesia: General | Site: Abdomen

## 2014-01-31 MED ORDER — IOHEXOL 300 MG/ML  SOLN
25.0000 mL | Freq: Once | INTRAMUSCULAR | Status: AC | PRN
Start: 2014-01-31 — End: 2014-01-31
  Administered 2014-01-31: 25 mL via ORAL

## 2014-01-31 MED ORDER — ONDANSETRON HCL 4 MG/2ML IJ SOLN
4.0000 mg | INTRAMUSCULAR | Status: AC | PRN
  Administered 2014-01-31 (×2): 4 mg via INTRAVENOUS
  Filled 2014-01-31 (×2): qty 2

## 2014-01-31 MED ORDER — PANTOPRAZOLE SODIUM 40 MG IV SOLR: 40.0000 mg | Freq: Every day | INTRAVENOUS | Status: DC

## 2014-01-31 MED ORDER — DIPHENHYDRAMINE HCL 50 MG/ML IJ SOLN: 12.5000 mg | Freq: Four times a day (QID) | INTRAMUSCULAR | Status: DC | PRN

## 2014-01-31 MED ORDER — HYDROCODONE-ACETAMINOPHEN 5-325 MG PO TABS
1.0000 | ORAL_TABLET | ORAL | Status: DC | PRN
  Administered 2014-01-31 – 2014-02-01 (×3): 2 via ORAL
  Filled 2014-01-31 (×3): qty 2

## 2014-01-31 MED ORDER — MIDAZOLAM HCL 5 MG/5ML IJ SOLN
INTRAMUSCULAR | Status: DC | PRN
  Administered 2014-01-31: 2 mg via INTRAVENOUS

## 2014-01-31 MED ORDER — IOHEXOL 300 MG/ML  SOLN
80.0000 mL | Freq: Once | INTRAMUSCULAR | Status: AC | PRN
  Administered 2014-01-31: 80 mL via INTRAVENOUS

## 2014-01-31 MED ORDER — MORPHINE SULFATE 2 MG/ML IJ SOLN
1.0000 mg | INTRAMUSCULAR | Status: DC | PRN
  Administered 2014-01-31: 4 mg via INTRAVENOUS
  Filled 2014-01-31: qty 2

## 2014-01-31 MED ORDER — OXYCODONE HCL 5 MG/5ML PO SOLN: 5.0000 mg | Freq: Once | ORAL | Status: DC | PRN

## 2014-01-31 MED ORDER — SUCCINYLCHOLINE CHLORIDE 20 MG/ML IJ SOLN
INTRAMUSCULAR | Status: DC | PRN
  Administered 2014-01-31: 100 mg via INTRAVENOUS

## 2014-01-31 MED ORDER — MEPERIDINE HCL 25 MG/ML IJ SOLN: 6.2500 mg | INTRAMUSCULAR | Status: DC | PRN

## 2014-01-31 MED ORDER — MORPHINE SULFATE 2 MG/ML IJ SOLN: 1.0000 mg | INTRAMUSCULAR | Status: DC | PRN

## 2014-01-31 MED ORDER — FENTANYL CITRATE 0.05 MG/ML IJ SOLN
INTRAMUSCULAR | Status: DC | PRN
  Administered 2014-01-31: 150 ug via INTRAVENOUS
  Administered 2014-01-31: 100 ug via INTRAVENOUS

## 2014-01-31 MED ORDER — PROMETHAZINE HCL 25 MG/ML IJ SOLN
6.2500 mg | INTRAMUSCULAR | Status: DC | PRN
Start: 2014-01-31 — End: 2014-01-31

## 2014-01-31 MED ORDER — ENOXAPARIN SODIUM 40 MG/0.4ML ~~LOC~~ SOLN
40.0000 mg | SUBCUTANEOUS | Status: DC
Start: 2014-02-01 — End: 2014-02-01
  Filled 2014-01-31: qty 0.4

## 2014-01-31 MED ORDER — GLYCOPYRROLATE 0.2 MG/ML IJ SOLN
INTRAMUSCULAR | Status: DC | PRN
  Administered 2014-01-31: 0.6 mg via INTRAVENOUS

## 2014-01-31 MED ORDER — OXYCODONE HCL 5 MG PO TABS: 5.0000 mg | ORAL_TABLET | Freq: Once | ORAL | Status: DC | PRN

## 2014-01-31 MED ORDER — ONDANSETRON HCL 4 MG/2ML IJ SOLN
INTRAMUSCULAR | Status: DC | PRN
  Administered 2014-01-31: 4 mg via INTRAVENOUS

## 2014-01-31 MED ORDER — MORPHINE SULFATE 4 MG/ML IJ SOLN
4.0000 mg | INTRAMUSCULAR | Status: DC | PRN
  Administered 2014-01-31: 4 mg via INTRAVENOUS
  Filled 2014-01-31: qty 1

## 2014-01-31 MED ORDER — MIDAZOLAM HCL 2 MG/2ML IJ SOLN: 0.5000 mg | Freq: Once | INTRAMUSCULAR | Status: DC | PRN

## 2014-01-31 MED ORDER — ROCURONIUM BROMIDE 100 MG/10ML IV SOLN
INTRAVENOUS | Status: DC | PRN
  Administered 2014-01-31: 30 mg via INTRAVENOUS

## 2014-01-31 MED ORDER — POTASSIUM CHLORIDE IN NACL 20-0.9 MEQ/L-% IV SOLN
INTRAVENOUS | Status: DC
  Administered 2014-01-31 – 2014-02-01 (×2): via INTRAVENOUS
  Filled 2014-01-31 (×4): qty 1000

## 2014-01-31 MED ORDER — NEOSTIGMINE METHYLSULFATE 10 MG/10ML IV SOLN
INTRAVENOUS | Status: DC | PRN
  Administered 2014-01-31: 5 mg via INTRAVENOUS

## 2014-01-31 MED ORDER — HYDROMORPHONE HCL PF 1 MG/ML IJ SOLN: 0.2500 mg | INTRAMUSCULAR | Status: DC | PRN

## 2014-01-31 MED ORDER — SODIUM CHLORIDE 0.9 % IR SOLN
Status: DC | PRN
  Administered 2014-01-31: 1000 mL

## 2014-01-31 MED ORDER — SODIUM CHLORIDE 0.9 % IV SOLN
INTRAVENOUS | Status: DC
  Administered 2014-01-31: 10:00:00 via INTRAVENOUS

## 2014-01-31 MED ORDER — BUPIVACAINE-EPINEPHRINE 0.25% -1:200000 IJ SOLN
INTRAMUSCULAR | Status: DC | PRN
  Administered 2014-01-31: 19 mL

## 2014-01-31 MED ORDER — DIPHENHYDRAMINE HCL 12.5 MG/5ML PO ELIX: 12.5000 mg | ORAL_SOLUTION | Freq: Four times a day (QID) | ORAL | Status: DC | PRN

## 2014-01-31 MED ORDER — LACTATED RINGERS IV SOLN
INTRAVENOUS | Status: DC | PRN
  Administered 2014-01-31 (×2): via INTRAVENOUS

## 2014-01-31 MED ORDER — PIPERACILLIN-TAZOBACTAM 3.375 G IVPB
3.3750 g | Freq: Three times a day (TID) | INTRAVENOUS | Status: DC
  Administered 2014-01-31: 3.375 g via INTRAVENOUS
  Filled 2014-01-31 (×2): qty 50

## 2014-01-31 MED ORDER — MORPHINE SULFATE 4 MG/ML IJ SOLN
4.0000 mg | INTRAMUSCULAR | Status: AC | PRN
  Administered 2014-01-31 (×2): 4 mg via INTRAVENOUS
  Filled 2014-01-31 (×2): qty 1

## 2014-01-31 MED ORDER — PROPOFOL 10 MG/ML IV BOLUS
INTRAVENOUS | Status: DC | PRN
  Administered 2014-01-31: 150 mg via INTRAVENOUS

## 2014-01-31 MED ORDER — BUPIVACAINE-EPINEPHRINE (PF) 0.25% -1:200000 IJ SOLN
INTRAMUSCULAR | Status: AC
  Filled 2014-01-31: qty 30

## 2014-01-31 SURGICAL SUPPLY — 47 items
APPLIER CLIP ROT 10 11.4 M/L (STAPLE)
BLADE SURG ROTATE 9660 (MISCELLANEOUS) IMPLANT
CANISTER SUCTION 2500CC (MISCELLANEOUS) ×2 IMPLANT
CHLORAPREP W/TINT 26ML (MISCELLANEOUS) ×2 IMPLANT
CLIP APPLIE ROT 10 11.4 M/L (STAPLE) IMPLANT
CLOSURE STERI-STRIP 1/4X4 (GAUZE/BANDAGES/DRESSINGS) ×2 IMPLANT
COVER SURGICAL LIGHT HANDLE (MISCELLANEOUS) ×2 IMPLANT
CUTTER LINEAR ENDO 35 ETS (STAPLE) ×2 IMPLANT
CUTTER LINEAR ENDO 35 ETS TH (STAPLE) IMPLANT
DECANTER SPIKE VIAL GLASS SM (MISCELLANEOUS) ×2 IMPLANT
DERMABOND ADVANCED (GAUZE/BANDAGES/DRESSINGS) ×1
DERMABOND ADVANCED .7 DNX12 (GAUZE/BANDAGES/DRESSINGS) ×1 IMPLANT
DRAPE UTILITY 15X26 W/TAPE STR (DRAPE) ×4 IMPLANT
DRSG TEGADERM 4X4.75 (GAUZE/BANDAGES/DRESSINGS) ×2 IMPLANT
ELECT REM PT RETURN 9FT ADLT (ELECTROSURGICAL) ×2
ELECTRODE REM PT RTRN 9FT ADLT (ELECTROSURGICAL) ×1 IMPLANT
ENDOLOOP SUT PDS II  0 18 (SUTURE)
ENDOLOOP SUT PDS II 0 18 (SUTURE) IMPLANT
GLOVE BIOGEL PI IND STRL 6.5 (GLOVE) ×1 IMPLANT
GLOVE BIOGEL PI IND STRL 8 (GLOVE) ×1 IMPLANT
GLOVE BIOGEL PI INDICATOR 6.5 (GLOVE) ×1
GLOVE BIOGEL PI INDICATOR 8 (GLOVE) ×1
GLOVE ECLIPSE 7.5 STRL STRAW (GLOVE) ×2 IMPLANT
GLOVE SURG SS PI 6.5 STRL IVOR (GLOVE) ×2 IMPLANT
GOWN STRL REUS W/ TWL LRG LVL3 (GOWN DISPOSABLE) ×2 IMPLANT
GOWN STRL REUS W/TWL LRG LVL3 (GOWN DISPOSABLE) ×2
KIT BASIN OR (CUSTOM PROCEDURE TRAY) ×2 IMPLANT
KIT ROOM TURNOVER OR (KITS) ×2 IMPLANT
NS IRRIG 1000ML POUR BTL (IV SOLUTION) ×2 IMPLANT
PAD ARMBOARD 7.5X6 YLW CONV (MISCELLANEOUS) ×4 IMPLANT
PENCIL BUTTON HOLSTER BLD 10FT (ELECTRODE) ×2 IMPLANT
POUCH SPECIMEN RETRIEVAL 10MM (ENDOMECHANICALS) ×2 IMPLANT
RELOAD /EVU35 (ENDOMECHANICALS) IMPLANT
RELOAD CUTTER ETS 35MM STAND (ENDOMECHANICALS) IMPLANT
SCALPEL HARMONIC ACE (MISCELLANEOUS) IMPLANT
SET IRRIG TUBING LAPAROSCOPIC (IRRIGATION / IRRIGATOR) ×2 IMPLANT
SPECIMEN JAR SMALL (MISCELLANEOUS) ×2 IMPLANT
SUT MNCRL AB 4-0 PS2 18 (SUTURE) ×2 IMPLANT
TOWEL OR 17X24 6PK STRL BLUE (TOWEL DISPOSABLE) ×2 IMPLANT
TOWEL OR 17X26 10 PK STRL BLUE (TOWEL DISPOSABLE) ×2 IMPLANT
TRAY FOLEY CATH 16FR SILVER (SET/KITS/TRAYS/PACK) ×2 IMPLANT
TRAY LAPAROSCOPIC (CUSTOM PROCEDURE TRAY) ×2 IMPLANT
TROCAR XCEL 12X100 BLDLESS (ENDOMECHANICALS) ×2 IMPLANT
TROCAR XCEL BLADELESS 5X75MML (TROCAR) ×2 IMPLANT
TROCAR XCEL BLUNT TIP 100MML (ENDOMECHANICALS) ×2 IMPLANT
TROCAR XCEL NON-BLD 5MMX100MML (ENDOMECHANICALS) ×2 IMPLANT
WATER STERILE IRR 1000ML POUR (IV SOLUTION) IMPLANT

## 2014-01-31 NOTE — Anesthesia Postprocedure Evaluation (Signed)
  Anesthesia Post-op Note  Patient: Katie Woodard  Procedure(s) Performed: Procedure(s): APPENDECTOMY LAPAROSCOPIC (N/A)  Patient Location: PACU  Anesthesia Type:General  Level of Consciousness: awake, alert , oriented and patient cooperative  Airway and Oxygen Therapy: Patient Spontanous Breathing  Post-op Pain: none  Post-op Assessment: Post-op Vital signs reviewed, Patient's Cardiovascular Status Stable, Respiratory Function Stable, Patent Airway, No signs of Nausea or vomiting and Pain level controlled  Post-op Vital Signs: Reviewed and stable  Last Vitals:  Filed Vitals:   01/31/14 2045  BP: 138/82  Pulse: 82  Temp: 37 C  Resp: 24    Complications: No apparent anesthesia complications

## 2014-01-31 NOTE — ED Notes (Signed)
Patient transported to or

## 2014-01-31 NOTE — Transfer of Care (Signed)
Immediate Anesthesia Transfer of Care Note  Patient: Katie Woodard  Procedure(s) Performed: Procedure(s): APPENDECTOMY LAPAROSCOPIC (N/A)  Patient Location: PACU  Anesthesia Type:General  Level of Consciousness: awake, alert , oriented and patient cooperative  Airway & Oxygen Therapy: Patient Spontanous Breathing and Patient connected to nasal cannula oxygen  Post-op Assessment: Report given to PACU RN and Post -op Vital signs reviewed and stable  Post vital signs: Reviewed and stable  Complications: No apparent anesthesia complications

## 2014-01-31 NOTE — H&P (Signed)
Im not sure this is appendicitis.  Wbc normal (although left shift).  Afebrile.  She does have diffuse abdominal pain but this is much more centered in her right abdomen and rlq now.  Her history began with pain then developed n/v/d.  She does not have abx usage in past.  I think it would be best to take to or for lap appy although ct not really consistent with longstanding appendicitis.  We discussed that surgery might be negative though. I will discuss her case with my partner Dr Hulen Skains who will take care of her tonight.  She might also have uti that I don't think is causing symptoms (not really a clean catch) and will treat.

## 2014-01-31 NOTE — H&P (Signed)
[facility] Surgery Admission Note  Katie Woodard 21-May-1966  [id number].    Requesting MD: Dr. Thurnell Garbe Chief Complaint/Reason for Consult: diffuse abdominal pain ?appendicitis  HPI:  48 y/o [id number] AA female c/o gradual onset and persistence of constant generalized abdominal pain since Sunday 01/27/14.  Has been associated with multiple intermittent episodes of N/V/D which started on Tuesday 01/29/14.  She thought it was her diverticulitis acting up so she went to a liquid diet.  Her last diverticulitis flair was 1 year ago at [facility].  She tried tylenol and motrin and acid reflux meds without relief.  The pain became so severe and diffuse that she had to leave work to be further evaluated.  Radiating pain across the entire abdomen.  Pain feels like pressure.  Has had a EDG and CSP in the past and was diagnosed with GERD.  No vomiting since 01/30/09, No BM since last night (diarrhea), having flatus.  Denies fevers/chills, no CP/SOB, no blood in stools or vomit.  H/o 4 c-sections and cholecystectomy.  Has chronic diarrhea/abdominal cramping daily as well as daily GERD symptoms.   ROS: All systems reviewed and otherwise negative except for as above  No family history on file.  Past Medical History  Diagnosis Date  . Diverticulitis   . Headache   . DDD (degenerative disc disease), cervical   . Uterine fibroid   . Ovarian cyst   . GERD (gastroesophageal reflux disease)     Takes OTC meds    Past Surgical History  Procedure Laterality Date  . Cesarean section  1988  . Cholecystectomy  2005    [facility]    Social History:  reports that she has never smoked. She does not have any smokeless tobacco history on file. She reports that she does not drink alcohol or use illicit drugs.  Allergies: No Known Allergies   (Not in a hospital admission)  Blood pressure 143/70, pulse 82, temperature 99.6 F (37.6 C), temperature source Oral, resp. rate 16, SpO2 97.00%. Physical Exam: General: pleasant,  WD/WN AA female who is laying in bed in mild discomfort due to pain HEENT: head is normocephalic, atraumatic.  Sclera are noninjected.  PERRL.  Ears and nose without any masses or lesions.  Mouth is pink and moist Heart: regular, rate, and rhythm.  No obvious murmurs, gallops, or rubs noted.  Palpable pedal pulses bilaterally Lungs: CTAB, no wheezes, rhonchi, or rales noted.  Respiratory effort nonlabored Abd: soft, diffusely tender >RLQ, +BS, no masses, hernias, or organomegaly, lower horizontal scar well healed as well as scattered laparoscopic scars MS: all 4 extremities are symmetrical with no cyanosis, clubbing, or edema. Skin: warm and dry with no masses, lesions, or rashes Psych: A&Ox3 with an appropriate affect.   Results for orders placed during the hospital encounter of 01/31/14 (from the past 48 hour(s))  CBC WITH DIFFERENTIAL     Status: Abnormal   Collection Time    01/31/14  9:12 AM      Result Value Ref Range   WBC 9.0  4.0 - 10.5 K/uL   RBC 5.42 (*) 3.87 - 5.11 MIL/uL   Hemoglobin 11.9 (*) 12.0 - 15.0 g/dL   HCT 37.3  36.0 - 46.0 %   MCV 68.8 (*) 78.0 - 100.0 fL   MCH 22.0 (*) 26.0 - 34.0 pg   MCHC 31.9  30.0 - 36.0 g/dL   RDW 16.8 (*) 11.5 - 15.5 %   Platelets 276  150 - 400 K/uL   Neutrophils  Relative % 78 (*) 43 - 77 %   Lymphocytes Relative 16  12 - 46 %   Monocytes Relative 6  3 - 12 %   Eosinophils Relative 0  0 - 5 %   Basophils Relative 0  0 - 1 %   Neutro Abs 7.1  1.7 - 7.7 K/uL   Lymphs Abs 1.4  0.7 - 4.0 K/uL   Monocytes Absolute 0.5  0.1 - 1.0 K/uL   Eosinophils Absolute 0.0  0.0 - 0.7 K/uL   Basophils Absolute 0.0  0.0 - 0.1 K/uL   RBC Morphology ELLIPTOCYTES    COMPREHENSIVE METABOLIC PANEL     Status: Abnormal   Collection Time    01/31/14  9:12 AM      Result Value Ref Range   Sodium 141  137 - 147 mEq/L   Potassium 3.4 (*) 3.7 - 5.3 mEq/L   Chloride 101  96 - 112 mEq/L   CO2 23  19 - 32 mEq/L   Glucose, Bld 94  70 - 99 mg/dL   BUN 10  6 -  23 mg/dL   Creatinine, Ser 0.42 (*) 0.50 - 1.10 mg/dL   Calcium 9.2  8.4 - 10.5 mg/dL   Total Protein 8.2  6.0 - 8.3 g/dL   Albumin 4.0  3.5 - 5.2 g/dL   AST 11  0 - 37 U/L   ALT 14  0 - 35 U/L   Alkaline Phosphatase 68  39 - 117 U/L   Total Bilirubin 0.4  0.3 - 1.2 mg/dL   GFR calc non Af Amer >90  >90 mL/min   GFR calc Af Amer >90  >90 mL/min   Comment: (NOTE)     The eGFR has been calculated using the CKD EPI equation.     This calculation has not been validated in all clinical situations.     eGFR's persistently <90 mL/min signify possible Chronic Kidney     Disease.   Anion gap 17 (*) 5 - 15  LIPASE, BLOOD     Status: None   Collection Time    01/31/14  9:12 AM      Result Value Ref Range   Lipase 17  11 - 59 U/L  URINALYSIS, ROUTINE W REFLEX MICROSCOPIC     Status: Abnormal   Collection Time    01/31/14  9:59 AM      Result Value Ref Range   Color, Urine YELLOW  YELLOW   APPearance CLOUDY (*) CLEAR   Specific Gravity, Urine 1.019  1.005 - 1.030   pH 6.0  5.0 - 8.0   Glucose, UA NEGATIVE  NEGATIVE mg/dL   Hgb urine dipstick SMALL (*) NEGATIVE   Bilirubin Urine NEGATIVE  NEGATIVE   Ketones, ur NEGATIVE  NEGATIVE mg/dL   Protein, ur 30 (*) NEGATIVE mg/dL   Urobilinogen, UA 1.0  0.0 - 1.0 mg/dL   Nitrite NEGATIVE  NEGATIVE   Leukocytes, UA SMALL (*) NEGATIVE  PREGNANCY, URINE     Status: None   Collection Time    01/31/14  9:59 AM      Result Value Ref Range   Preg Test, Ur NEGATIVE  NEGATIVE   Comment:            THE SENSITIVITY OF THIS     METHODOLOGY IS >20 mIU/mL.  URINE MICROSCOPIC-ADD ON     Status: Abnormal   Collection Time    01/31/14  9:59 AM      Result Value  Ref Range   Squamous Epithelial / LPF MANY (*) RARE   WBC, UA 21-50  <3 WBC/hpf   RBC / HPF 0-2  <3 RBC/hpf   Bacteria, UA MANY (*) RARE   Ct Abdomen Pelvis W Contrast  01/31/2014   CLINICAL DATA:  Diverticulitis.  EXAM: CT ABDOMEN AND PELVIS WITH CONTRAST  TECHNIQUE: Multidetector CT imaging  of the abdomen and pelvis was performed using the standard protocol following bolus administration of intravenous contrast.  CONTRAST:  64m OMNIPAQUE IOHEXOL 300 MG/ML  SOLN  COMPARISON:  CT 03/05/2004.  FINDINGS: Diffuse fatty infiltration of the liver. Spleen is normal. Pancreas normal. No biliary distention. Surgical clips noted in the gallbladder fossa.  Adrenals are normal. Simple cysts are in both kidneys. No hydronephrosis. No obstructing ureteral stone. The bladder is nondistended. Fibroid uterus. Uterus and adnexa otherwise unremarkable. No free pelvic fluid.  No significant adenopathy. Abdominal aorta normal in caliber. Visceral vessels are patent. No aneurysm.  Minimal prominence of of the appendix is noted. Appendix diameter is 8 mm. Minimal periappendiceal edema and small periappendiceal lymph nodes are present. Mild appendicitis cannot be completely excluded. Diverticulosis. No evidence of diverticulitis. No bowel obstruction or free air. Prominent thickening of the antral wall is noted. Although focal gastritis could present in this fashion. This finding is suspicious for a gastric malignancy. Endoscopic evaluation is suggested.  Heart size is normal. Mild basilar atelectasis. Diffuse degenerative changes lumbar spine.  IMPRESSION: 1.  Cannot exclude subtle changes of appendicitis.  2. Gastric antral wall thickening. Although focal gastritis could present this fashion, gastric malignancy cannot be excluded and endoscopic evaluation suggested.   Electronically Signed   By: TMarcello Moores Register   On: 01/31/2014 12:11      Assessment/Plan Diffuse abdominal pain >RLQ Abnormal appendix Gastric antral wall thickening/gastritis Nausea/vomiting Diarrhea H/o diverticulitis H/o chronic abdominal discomfort H/o chronic diarrhea H/o 4 c-sections and lap cholecystectomy H/o GERD  Plan: 1.  Diagnosis is unclear.  Story is not classic for appendicitis nor does is the CT convincing for appendicitis.   Appendix if at all is early, but if we operate on her this may not be the cause.  Dr. WHulen Skainsto see later, but will likely need diagnostic laparoscopy and appendectomy. 2.  NPO, bowel rest, IVF, pain control, antiemetics, antibiotics (zosyn) 3.  Timing of surgery pending Dr. WHulen Skainsevaluation 4.  Will admit to [facility], may need GI consult for gastric antral wall thickening/gastritis   DCoralie Keens PThe Bariatric Center Of [facility], LLCSurgery 01/31/2014, 2:28 PM Pager: [phone]

## 2014-01-31 NOTE — Op Note (Signed)
OPERATIVE REPORT  DATE OF OPERATION: 01/31/2014  PATIENT:  Katie Woodard  48 y.o. female  PRE-OPERATIVE DIAGNOSIS:  Abnormal appendix on CT scan with abdominal pain  POST-OPERATIVE DIAGNOSIS:  Abnormal appendix on CT scan with abdominal pain  PROCEDURE:  Procedure(s): APPENDECTOMY LAPAROSCOPIC  SURGEON:  Surgeon(s): Gwenyth Ober, MD  ASSISTANT: None  ANESTHESIA:   general  EBL: <20 ml  BLOOD ADMINISTERED: none  DRAINS: none   SPECIMEN:  Source of Specimen:  Appendix  COUNTS CORRECT:  YES  PROCEDURE DETAILS: The patient was taken to the operating room and placed on the table in the supine position. After an adequate general endotracheal anesthetic was administered she was prepped and draped in the usual manner exposing the entire abdomen.  A proper timeout was performed identifying the patient and the procedure to be performed. An infraumbilical midline incision was made using a #15 blade and taken down to the midline fascia. We incised midline fascia with a #15 blade the graft the edges with a Coker clamp. We bluntly dissected down into the peritoneal cavity. Subsequently a pursestring suture of 0 Vicryl was passed around the fascial opening was secured in a Hassan cannula. We then insufflated carbon dioxide gas up to a maximal intra-abdominal pressure of 15 mm mercury. The patient was placed in Trendelenburg and the left side was tilted down.  Right upper quadrant 5 mm cannula and a left low quadrant 12 mm cannula passed under direct vision. The appendix was noted to be in the right lower quadrant was not adhered to any surrounding structures and did not appear to be acutely inflamed. Nose no exudate on the surface however there was some mild injection near the tip but certainly at most early acute appendicitis.  We dissected out appendicitis based and subsequently came across the base of the appendix and cecum using a blue cartridge Endo GIA stapler. The mesentery was taken  using a Harmonic Scalpel. We then retrieved the appendix from the left low quadrant cannula site.  There was minimal to no bleeding. We inspected the pelvic and lower abdominal cavity for other evidence of inflammatory processes. None were noted. Specifically the patient's ovaries bilaterally and both tubes appeared to be without evidence of inflammation.  We subsequently aspirated all fluid and gas in the peritoneal cavity. The infraumbilical fascial site was closed using a pursestring suture which was in place. We injected cortisone Marcaine and all skin sites and close the skin in the left low quadrant in the infraumbilical site using running subcuticular stitch of 4-0 Monocryl. Dermabond Steri-Strips and Tegaderm she used to complete our dressings. All needle counts, sponge counts, and this may counts were correct.  PATIENT DISPOSITION:  PACU - hemodynamically stable.   Gwenyth Ober 7/9/20157:24 PM

## 2014-01-31 NOTE — Consult Note (Deleted)
[facility] Surgery Admission Note  Katie Woodard Sep 21, 1965  [id number].    Requesting MD: Dr. Thurnell Garbe Chief Complaint/Reason for Consult: diffuse abdominal pain ?appendicitis  HPI:  48 y/o [id number] AA female c/o gradual onset and persistence of constant generalized abdominal pain since Sunday 01/27/14.  Has been associated with multiple intermittent episodes of N/V/D which started on Tuesday 01/29/14.  She thought it was her diverticulitis acting up so she went to a liquid diet.  Her last diverticulitis flair was 1 year ago at [facility]  She tried tylenol and motrin and acid reflux meds without relief.  The pain became so severe and diffuse that she had to leave work to be further evaluated.  Radiating pain across the entire abdomen.  Pain feels like pressure.  Has had a EDG and CSP in the past and was diagnosed with GERD.  No vomiting since 01/30/09, No BM since last night (diarrhea), having flatus.  Denies fevers/chills, no CP/SOB, no blood in stools or vomit.  H/o 4 c-sections and cholecystectomy.  Has chronic diarrhea/abdominal cramping daily as well as daily GERD symptoms.   ROS: All systems reviewed and otherwise negative except for as above  No family history on file.  Past Medical History  Diagnosis Date  . Diverticulitis   . Headache   . DDD (degenerative disc disease), cervical   . Uterine fibroid   . Ovarian cyst   . GERD (gastroesophageal reflux disease)     Takes OTC meds    Past Surgical History  Procedure Laterality Date  . Cesarean section  1988  . Cholecystectomy  2005    [facility]    Social History:  reports that she has never smoked. She does not have any smokeless tobacco history on file. She reports that she does not drink alcohol or use illicit drugs.  Allergies: No Known Allergies   (Not in a hospital admission)  Blood pressure 143/70, pulse 82, temperature 99.6 F (37.6 C), temperature source Oral, resp. rate 16, SpO2 97.00%. Physical Exam: General: pleasant,  WD/WN AA female who is laying in bed in mild discomfort due to pain HEENT: head is normocephalic, atraumatic.  Sclera are noninjected.  PERRL.  Ears and nose without any masses or lesions.  Mouth is pink and moist Heart: regular, rate, and rhythm.  No obvious murmurs, gallops, or rubs noted.  Palpable pedal pulses bilaterally Lungs: CTAB, no wheezes, rhonchi, or rales noted.  Respiratory effort nonlabored Abd: soft, diffusely tender >RLQ, +BS, no masses, hernias, or organomegaly, lower horizontal scar well healed as well as scattered laparoscopic scars MS: all 4 extremities are symmetrical with no cyanosis, clubbing, or edema. Skin: warm and dry with no masses, lesions, or rashes Psych: A&Ox3 with an appropriate affect.   Results for orders placed during the hospital encounter of 01/31/14 (from the past 48 hour(s))  CBC WITH DIFFERENTIAL     Status: Abnormal   Collection Time    01/31/14  9:12 AM      Result Value Ref Range   WBC 9.0  4.0 - 10.5 K/uL   RBC 5.42 (*) 3.87 - 5.11 MIL/uL   Hemoglobin 11.9 (*) 12.0 - 15.0 g/dL   HCT 37.3  36.0 - 46.0 %   MCV 68.8 (*) 78.0 - 100.0 fL   MCH 22.0 (*) 26.0 - 34.0 pg   MCHC 31.9  30.0 - 36.0 g/dL   RDW 16.8 (*) 11.5 - 15.5 %   Platelets 276  150 - 400 K/uL   Neutrophils  Relative % 78 (*) 43 - 77 %   Lymphocytes Relative 16  12 - 46 %   Monocytes Relative 6  3 - 12 %   Eosinophils Relative 0  0 - 5 %   Basophils Relative 0  0 - 1 %   Neutro Abs 7.1  1.7 - 7.7 K/uL   Lymphs Abs 1.4  0.7 - 4.0 K/uL   Monocytes Absolute 0.5  0.1 - 1.0 K/uL   Eosinophils Absolute 0.0  0.0 - 0.7 K/uL   Basophils Absolute 0.0  0.0 - 0.1 K/uL   RBC Morphology ELLIPTOCYTES    COMPREHENSIVE METABOLIC PANEL     Status: Abnormal   Collection Time    01/31/14  9:12 AM      Result Value Ref Range   Sodium 141  137 - 147 mEq/L   Potassium 3.4 (*) 3.7 - 5.3 mEq/L   Chloride 101  96 - 112 mEq/L   CO2 23  19 - 32 mEq/L   Glucose, Bld 94  70 - 99 mg/dL   BUN 10  6 -  23 mg/dL   Creatinine, Ser 0.42 (*) 0.50 - 1.10 mg/dL   Calcium 9.2  8.4 - 10.5 mg/dL   Total Protein 8.2  6.0 - 8.3 g/dL   Albumin 4.0  3.5 - 5.2 g/dL   AST 11  0 - 37 U/L   ALT 14  0 - 35 U/L   Alkaline Phosphatase 68  39 - 117 U/L   Total Bilirubin 0.4  0.3 - 1.2 mg/dL   GFR calc non Af Amer >90  >90 mL/min   GFR calc Af Amer >90  >90 mL/min   Comment: (NOTE)     The eGFR has been calculated using the CKD EPI equation.     This calculation has not been validated in all clinical situations.     eGFR's persistently <90 mL/min signify possible Chronic Kidney     Disease.   Anion gap 17 (*) 5 - 15  LIPASE, BLOOD     Status: None   Collection Time    01/31/14  9:12 AM      Result Value Ref Range   Lipase 17  11 - 59 U/L  URINALYSIS, ROUTINE W REFLEX MICROSCOPIC     Status: Abnormal   Collection Time    01/31/14  9:59 AM      Result Value Ref Range   Color, Urine YELLOW  YELLOW   APPearance CLOUDY (*) CLEAR   Specific Gravity, Urine 1.019  1.005 - 1.030   pH 6.0  5.0 - 8.0   Glucose, UA NEGATIVE  NEGATIVE mg/dL   Hgb urine dipstick SMALL (*) NEGATIVE   Bilirubin Urine NEGATIVE  NEGATIVE   Ketones, ur NEGATIVE  NEGATIVE mg/dL   Protein, ur 30 (*) NEGATIVE mg/dL   Urobilinogen, UA 1.0  0.0 - 1.0 mg/dL   Nitrite NEGATIVE  NEGATIVE   Leukocytes, UA SMALL (*) NEGATIVE  PREGNANCY, URINE     Status: None   Collection Time    01/31/14  9:59 AM      Result Value Ref Range   Preg Test, Ur NEGATIVE  NEGATIVE   Comment:            THE SENSITIVITY OF THIS     METHODOLOGY IS >20 mIU/mL.  URINE MICROSCOPIC-ADD ON     Status: Abnormal   Collection Time    01/31/14  9:59 AM      Result Value  Ref Range   Squamous Epithelial / LPF MANY (*) RARE   WBC, UA 21-50  <3 WBC/hpf   RBC / HPF 0-2  <3 RBC/hpf   Bacteria, UA MANY (*) RARE   Ct Abdomen Pelvis W Contrast  01/31/2014   CLINICAL DATA:  Diverticulitis.  EXAM: CT ABDOMEN AND PELVIS WITH CONTRAST  TECHNIQUE: Multidetector CT imaging  of the abdomen and pelvis was performed using the standard protocol following bolus administration of intravenous contrast.  CONTRAST:  73m OMNIPAQUE IOHEXOL 300 MG/ML  SOLN  COMPARISON:  CT 03/05/2004.  FINDINGS: Diffuse fatty infiltration of the liver. Spleen is normal. Pancreas normal. No biliary distention. Surgical clips noted in the gallbladder fossa.  Adrenals are normal. Simple cysts are in both kidneys. No hydronephrosis. No obstructing ureteral stone. The bladder is nondistended. Fibroid uterus. Uterus and adnexa otherwise unremarkable. No free pelvic fluid.  No significant adenopathy. Abdominal aorta normal in caliber. Visceral vessels are patent. No aneurysm.  Minimal prominence of of the appendix is noted. Appendix diameter is 8 mm. Minimal periappendiceal edema and small periappendiceal lymph nodes are present. Mild appendicitis cannot be completely excluded. Diverticulosis. No evidence of diverticulitis. No bowel obstruction or free air. Prominent thickening of the antral wall is noted. Although focal gastritis could present in this fashion. This finding is suspicious for a gastric malignancy. Endoscopic evaluation is suggested.  Heart size is normal. Mild basilar atelectasis. Diffuse degenerative changes lumbar spine.  IMPRESSION: 1.  Cannot exclude subtle changes of appendicitis.  2. Gastric antral wall thickening. Although focal gastritis could present this fashion, gastric malignancy cannot be excluded and endoscopic evaluation suggested.   Electronically Signed   By: TMarcello Moores Register   On: 01/31/2014 12:11      Assessment/Plan Diffuse abdominal pain >RLQ Abnormal appendix Gastric antral wall thickening/gastritis Nausea/vomiting Diarrhea H/o diverticulitis H/o chronic abdominal discomfort H/o chronic diarrhea H/o 4 c-sections and lap cholecystectomy H/o GERD  Plan: 1.  Diagnosis is unclear.  Story is not classic for appendicitis nor does is the CT convincing for appendicitis.   Appendix if at all is early, but if we operate on her this may not be the cause.  Dr. WHulen Skainsto see later, but will likely need diagnostic laparoscopy and appendectomy. 2.  NPO, bowel rest, IVF, pain control, antiemetics, antibiotics (zosyn) 3.  Timing of surgery pending Dr. WHulen Skainsevaluation 4.  Will admit to [facility], may need GI consult for gastric antral wall thickening/gastritis   DCoralie Keens PTwelve[facility] 01/31/2014, 2:28 PM Pager: [phone]

## 2014-01-31 NOTE — Anesthesia Preprocedure Evaluation (Signed)
Anesthesia Evaluation  Patient identified by MRN, date of birth, ID band Patient awake    Reviewed: Allergy & Precautions, H&P , NPO status , Patient's Chart, lab work & pertinent test results, reviewed documented beta blocker date and time   Airway Mallampati: II TM Distance: >3 FB Neck ROM: full    Dental   Pulmonary neg pulmonary ROS,  breath sounds clear to auscultation        Cardiovascular negative cardio ROS  Rhythm:regular     Neuro/Psych  Headaches, negative psych ROS   GI/Hepatic Neg liver ROS, GERD-  Medicated and Controlled,  Endo/Other  Morbid obesity  Renal/GU negative Renal ROS  negative genitourinary   Musculoskeletal   Abdominal   Peds  Hematology  (+) anemia ,   Anesthesia Other Findings See surgeon's H&P   Reproductive/Obstetrics negative OB ROS                           Anesthesia Physical Anesthesia Plan  ASA: III and emergent  Anesthesia Plan: General   Post-op Pain Management:    Induction: Intravenous, Rapid sequence and Cricoid pressure planned  Airway Management Planned: Oral ETT and Video Laryngoscope Planned  Additional Equipment:   Intra-op Plan:   Post-operative Plan:   Informed Consent: I have reviewed the patients History and Physical, chart, labs and discussed the procedure including the risks, benefits and alternatives for the proposed anesthesia with the patient or authorized representative who has indicated his/her understanding and acceptance.   Dental Advisory Given  Plan Discussed with: CRNA and Surgeon  Anesthesia Plan Comments:         Anesthesia Quick Evaluation

## 2014-01-31 NOTE — ED Notes (Signed)
Finished with contrast informed CT

## 2014-01-31 NOTE — Anesthesia Procedure Notes (Signed)
Procedure Name: Intubation Date/Time: 01/31/2014 6:07 PM Performed by: Melina Copa, Leighanne Adolph R Pre-anesthesia Checklist: Patient identified, Emergency Drugs available, Suction available, Patient being monitored and Timeout performed Patient Re-evaluated:Patient Re-evaluated prior to inductionOxygen Delivery Method: Circle system utilized Preoxygenation: Pre-oxygenation with 100% oxygen Intubation Type: IV induction, Rapid sequence and Cricoid Pressure applied Ventilation: Mask ventilation without difficulty Laryngoscope Size: Mac and 3 Grade View: Grade II Tube type: Oral Tube size: 7.5 mm Number of attempts: 1 Airway Equipment and Method: Stylet Placement Confirmation: ETT inserted through vocal cords under direct vision,  positive ETCO2 and breath sounds checked- equal and bilateral Secured at: 22 cm Tube secured with: Tape Dental Injury: Teeth and Oropharynx as per pre-operative assessment

## 2014-01-31 NOTE — ED Notes (Signed)
Surgery has been in to see pt. Awaiting further orders.

## 2014-01-31 NOTE — Progress Notes (Signed)
Approx.  1715, Pt. seen in [facility], IV accessed & flushed. CHG wipes done, Pt. Identified.

## 2014-01-31 NOTE — ED Notes (Addendum)
Hx diverticulitis; pain and vomiting x 4 days. Panic attack at work. HTN when EMS first on scene; came down when she calmed down. LRQ pain. Has not had diverticulitis flair in years and does not have a GI MD.

## 2014-01-31 NOTE — ED Provider Notes (Signed)
CSN: [id number]     Arrival date & time 01/31/14  0846 History   First MD Initiated Contact with Patient 01/31/14 [phone]     Chief Complaint  Patient presents with  . Abdominal Pain     HPI Pt was seen at 0850.  Per pt, c/o gradual onset and persistence of constant generalized abd "pain" for the past 4 days.  Has been associated with multiple intermittent episodes of N/V/D.  Describes the abd pain as "like the last time I had diverticulitis."  Describes the stools as "loose." EMS states pt was at work PTA and "had a panic attack." Pt calmed en route to the hospital. Denies fevers, no back pain, no rash, no CP/SOB, no black or blood in stools or emesis.       Past Medical History  Diagnosis Date  . Diverticulitis   . Headache   . DDD (degenerative disc disease), cervical   . Uterine fibroid   . Ovarian cyst    Past Surgical History  Procedure Laterality Date  . Cesarean section    . Cholecystectomy      History  Substance Use Topics  . Smoking status: Never Smoker   . Smokeless tobacco: Not on file  . Alcohol Use: No    Review of Systems ROS: Statement: All systems negative except as marked or noted in the HPI; Constitutional: Negative for fever and chills. ; ; Eyes: Negative for eye pain, redness and discharge. ; ; ENMT: Negative for ear pain, hoarseness, nasal congestion, sinus pressure and sore throat. ; ; Cardiovascular: Negative for chest pain, palpitations, diaphoresis, dyspnea and peripheral edema. ; ; Respiratory: Negative for cough, wheezing and stridor. ; ; Gastrointestinal: +N/V/D, abd pain. Negative for blood in stool, hematemesis, jaundice and rectal bleeding. . ; ; Genitourinary: Negative for dysuria, flank pain and hematuria. ; ; Musculoskeletal: Negative for back pain and neck pain. Negative for swelling and trauma.; ; Skin: Negative for pruritus, rash, abrasions, blisters, bruising and skin lesion.; ; Neuro: Negative for headache, lightheadedness and neck stiffness.  Negative for weakness, altered level of consciousness , altered mental status, extremity weakness, paresthesias, involuntary movement, seizure and syncope.        Allergies  Review of patient's allergies indicates no known allergies.  Home Medications   Prior to Admission medications   Medication Sig Start Date End Date Taking? Authorizing Provider  acetaminophen (TYLENOL) 500 MG tablet Take 1,000 mg by mouth every 6 (six) hours as needed for moderate pain.   Yes Historical Provider, MD  ibuprofen (ADVIL,MOTRIN) 200 MG tablet Take 200 mg by mouth every 6 (six) hours as needed.   Yes Historical Provider, MD   BP 155/111  Pulse 95  Temp(Src) 99.6 F (37.6 C) (Oral)  Resp 20  SpO2 100% Physical Exam 0855: Physical examination:  Nursing notes reviewed; Vital signs and O2 SAT reviewed;  Constitutional: Well developed, Well nourished, Well hydrated, Uncomfortable appearing.; Head:  Normocephalic, atraumatic; Eyes: EOMI, PERRL, No scleral icterus; ENMT: Mouth and pharynx normal, Mucous membranes moist; Neck: Supple, Full range of motion, No lymphadenopathy; Cardiovascular: Regular rate and rhythm, No murmur, rub, or gallop; Respiratory: Breath sounds clear & equal bilaterally, No rales, rhonchi, wheezes.  Speaking full sentences with ease, Normal respiratory effort/excursion; Chest: Nontender, Movement normal; Abdomen: Soft, +diffuse tenderness to palp. Nondistended, Normal bowel sounds; Genitourinary: No CVA tenderness; Extremities: Pulses normal, No tenderness, No edema, No calf edema or asymmetry.; Neuro: AA&Ox3, Major CN grossly intact.  Speech clear. No gross focal  motor or sensory deficits in extremities.; Skin: Color normal, Warm, Dry.   ED Course  Procedures     EKG Interpretation None      MDM  MDM Reviewed: previous chart, nursing note and vitals Reviewed previous: labs and CT scan Interpretation: labs and CT scan     Results for orders placed during the hospital  encounter of 01/31/14  URINALYSIS, ROUTINE W REFLEX MICROSCOPIC      Result Value Ref Range   Color, Urine YELLOW  YELLOW   APPearance CLOUDY (*) CLEAR   Specific Gravity, Urine 1.019  1.005 - 1.030   pH 6.0  5.0 - 8.0   Glucose, UA NEGATIVE  NEGATIVE mg/dL   Hgb urine dipstick SMALL (*) NEGATIVE   Bilirubin Urine NEGATIVE  NEGATIVE   Ketones, ur NEGATIVE  NEGATIVE mg/dL   Protein, ur 30 (*) NEGATIVE mg/dL   Urobilinogen, UA 1.0  0.0 - 1.0 mg/dL   Nitrite NEGATIVE  NEGATIVE   Leukocytes, UA SMALL (*) NEGATIVE  CBC WITH DIFFERENTIAL      Result Value Ref Range   WBC 9.0  4.0 - 10.5 K/uL   RBC 5.42 (*) 3.87 - 5.11 MIL/uL   Hemoglobin 11.9 (*) 12.0 - 15.0 g/dL   HCT 37.3  36.0 - 46.0 %   MCV 68.8 (*) 78.0 - 100.0 fL   MCH 22.0 (*) 26.0 - 34.0 pg   MCHC 31.9  30.0 - 36.0 g/dL   RDW 16.8 (*) 11.5 - 15.5 %   Platelets 276  150 - 400 K/uL   Neutrophils Relative % 78 (*) 43 - 77 %   Lymphocytes Relative 16  12 - 46 %   Monocytes Relative 6  3 - 12 %   Eosinophils Relative 0  0 - 5 %   Basophils Relative 0  0 - 1 %   Neutro Abs 7.1  1.7 - 7.7 K/uL   Lymphs Abs 1.4  0.7 - 4.0 K/uL   Monocytes Absolute 0.5  0.1 - 1.0 K/uL   Eosinophils Absolute 0.0  0.0 - 0.7 K/uL   Basophils Absolute 0.0  0.0 - 0.1 K/uL   RBC Morphology ELLIPTOCYTES    COMPREHENSIVE METABOLIC PANEL      Result Value Ref Range   Sodium 141  137 - 147 mEq/L   Potassium 3.4 (*) 3.7 - 5.3 mEq/L   Chloride 101  96 - 112 mEq/L   CO2 23  19 - 32 mEq/L   Glucose, Bld 94  70 - 99 mg/dL   BUN 10  6 - 23 mg/dL   Creatinine, Ser 0.42 (*) 0.50 - 1.10 mg/dL   Calcium 9.2  8.4 - 10.5 mg/dL   Total Protein 8.2  6.0 - 8.3 g/dL   Albumin 4.0  3.5 - 5.2 g/dL   AST 11  0 - 37 U/L   ALT 14  0 - 35 U/L   Alkaline Phosphatase 68  39 - 117 U/L   Total Bilirubin 0.4  0.3 - 1.2 mg/dL   GFR calc non Af Amer >90  >90 mL/min   GFR calc Af Amer >90  >90 mL/min   Anion gap 17 (*) 5 - 15  LIPASE, BLOOD      Result Value Ref Range    Lipase 17  11 - 59 U/L  PREGNANCY, URINE      Result Value Ref Range   Preg Test, Ur NEGATIVE  NEGATIVE  URINE MICROSCOPIC-ADD ON  Result Value Ref Range   Squamous Epithelial / LPF MANY (*) RARE   WBC, UA 21-50  <3 WBC/hpf   RBC / HPF 0-2  <3 RBC/hpf   Bacteria, UA MANY (*) RARE   Ct Abdomen Pelvis W Contrast 01/31/2014   CLINICAL DATA:  Diverticulitis.  EXAM: CT ABDOMEN AND PELVIS WITH CONTRAST  TECHNIQUE: Multidetector CT imaging of the abdomen and pelvis was performed using the standard protocol following bolus administration of intravenous contrast.  CONTRAST:  76mL OMNIPAQUE IOHEXOL 300 MG/ML  SOLN  COMPARISON:  CT 03/05/2004.  FINDINGS: Diffuse fatty infiltration of the liver. Spleen is normal. Pancreas normal. No biliary distention. Surgical clips noted in the gallbladder fossa.  Adrenals are normal. Simple cysts are in both kidneys. No hydronephrosis. No obstructing ureteral stone. The bladder is nondistended. Fibroid uterus. Uterus and adnexa otherwise unremarkable. No free pelvic fluid.  No significant adenopathy. Abdominal aorta normal in caliber. Visceral vessels are patent. No aneurysm.  Minimal prominence of of the appendix is noted. Appendix diameter is 8 mm. Minimal periappendiceal edema and small periappendiceal lymph nodes are present. Mild appendicitis cannot be completely excluded. Diverticulosis. No evidence of diverticulitis. No bowel obstruction or free air. Prominent thickening of the antral wall is noted. Although focal gastritis could present in this fashion. This finding is suspicious for a gastric malignancy. Endoscopic evaluation is suggested.  Heart size is normal. Mild basilar atelectasis. Diffuse degenerative changes lumbar spine.  IMPRESSION: 1.  Cannot exclude subtle changes of appendicitis.  2. Gastric antral wall thickening. Although focal gastritis could present this fashion, gastric malignancy cannot be excluded and endoscopic evaluation suggested.    Electronically Signed   By: Marcello Moores  Register   On: 01/31/2014 12:11    1230:  Pt has not vomited or stooled while in the ED. Dx and testing d/w pt and family.  Questions answered.  Verb understanding, agreeable to Surgical consult.  T/C to General Surgery, case discussed, including:  HPI, pertinent PM/SHx, VS/PE, dx testing, ED course and treatment:  Agreeable to consult.   Alfonzo Feller, DO 02/02/14 1419

## 2014-01-31 NOTE — ED Notes (Signed)
EDP at bedside updating pt 

## 2014-01-31 NOTE — ED Notes (Signed)
Pt returned from CT °

## 2014-01-31 NOTE — ED Notes (Signed)
Patient transported to CT 

## 2014-01-31 NOTE — ED Notes (Signed)
Or has called for pt

## 2014-01-31 NOTE — Discharge Planning (Signed)
[place] to patient and family at bedside about primary care resources and the [facility] orange card. [employer] Training and development officer provided to the patient. Patient instructed to contact me discharged to further go over the application. My contact information provided for any future questions or concerns.

## 2014-01-31 NOTE — ED Notes (Signed)
Report called Eustaquio Maize, Therapist, sports. Pt to be taken to [facility].

## 2014-01-31 NOTE — Progress Notes (Signed)
I have seen the patient and examined her.  The level of abdominal pain is out of proportion to the amount of inflammation that is seen on the CT scan.  Her diffuse lower abdominal tenderness does not correlate with her CT findings, and after four days I would expect that her acute findings would be a lot more.  Even so, I will go ahead and plan for lap appy.  I have explained the risks and benefits to the patient and she wishes to proceed.  Kathryne Eriksson. Dahlia Bailiff, MD, [place] [phone] [facility]

## 2014-02-01 ENCOUNTER — Encounter (HOSPITAL_COMMUNITY): Payer: Self-pay | Admitting: General Surgery

## 2014-02-01 MED ORDER — ONDANSETRON HCL 4 MG PO TABS: 4.0000 mg | ORAL_TABLET | Freq: Three times a day (TID) | ORAL | Status: DC | PRN

## 2014-02-01 MED ORDER — HYDROCODONE-ACETAMINOPHEN 5-325 MG PO TABS: 1.0000 | ORAL_TABLET | Freq: Four times a day (QID) | ORAL | Status: DC | PRN

## 2014-02-01 NOTE — Discharge Summary (Signed)
Physician Discharge Summary  Patient ID: Katie Woodard MRN: [id number] DOB/AGE: 1965/09/16 48 y.o.  Admit date: 01/31/2014 Discharge date: 02/01/2014  Admitting Diagnosis: Abdominal pain Abnormal appendix Nausea and vomiting diarrhea  Discharge Diagnosis same  Consultants none  Imaging: Ct Abdomen Pelvis W Contrast  01/31/2014   CLINICAL DATA:  Diverticulitis.  EXAM: CT ABDOMEN AND PELVIS WITH CONTRAST  TECHNIQUE: Multidetector CT imaging of the abdomen and pelvis was performed using the standard protocol following bolus administration of intravenous contrast.  CONTRAST:  85mL OMNIPAQUE IOHEXOL 300 MG/ML  SOLN  COMPARISON:  CT 03/05/2004.  FINDINGS: Diffuse fatty infiltration of the liver. Spleen is normal. Pancreas normal. No biliary distention. Surgical clips noted in the gallbladder fossa.  Adrenals are normal. Simple cysts are in both kidneys. No hydronephrosis. No obstructing ureteral stone. The bladder is nondistended. Fibroid uterus. Uterus and adnexa otherwise unremarkable. No free pelvic fluid.  No significant adenopathy. Abdominal aorta normal in caliber. Visceral vessels are patent. No aneurysm.  Minimal prominence of of the appendix is noted. Appendix diameter is 8 mm. Minimal periappendiceal edema and small periappendiceal lymph nodes are present. Mild appendicitis cannot be completely excluded. Diverticulosis. No evidence of diverticulitis. No bowel obstruction or free air. Prominent thickening of the antral wall is noted. Although focal gastritis could present in this fashion. This finding is suspicious for a gastric malignancy. Endoscopic evaluation is suggested.  Heart size is normal. Mild basilar atelectasis. Diffuse degenerative changes lumbar spine.  IMPRESSION: 1.  Cannot exclude subtle changes of appendicitis.  2. Gastric antral wall thickening. Although focal gastritis could present this fashion, gastric malignancy cannot be excluded and endoscopic evaluation suggested.    Electronically Signed   By: Marcello Moores  Register   On: 01/31/2014 12:11    Procedures Laparoscopic appendectomy---Dr. Hulen Skains 01/31/14  Hospital Course:  Katie Woodard is a 48 year old female with a history of diverticulosis, GERD, headache who presented to [facility] with RLQ abdominal pain, n/v/d.  Workup showed a mildly elevated white count, a CT scan possible appendicitis.  Patient was admitted and underwent procedure listed above.  Tolerated procedure well and was transferred to the floor.  Diet was advanced as tolerated.  On POD#1, the patient was voiding well, tolerating diet, ambulating well, pain well controlled, vital signs stable, incisions c/d/i and felt stable for discharge home.  Patient will follow up in our office in 3 weeks and knows to call with questions or concerns.  Should she continue to have abdominal pain, she will need outpatient primary care follow up.  She plans to establish with the wellness center.  Physical Exam: General:  Alert, NAD, pleasant, comfortable Abd:  Soft, ND, mild tenderness, incisions C/D/I    Medication List         acetaminophen 500 MG tablet  Commonly known as:  TYLENOL  Take 1,000 mg by mouth every 6 (six) hours as needed for moderate pain.     HYDROcodone-acetaminophen 5-325 MG per tablet  Commonly known as:  NORCO/VICODIN  Take 1-2 tablets by mouth every 6 (six) hours as needed for moderate pain or severe pain.     ibuprofen 200 MG tablet  Commonly known as:  ADVIL,MOTRIN  Take 200 mg by mouth every 6 (six) hours as needed.             Follow-up Information   Follow up with [facility] On 02/26/2014. (arrive by 1:45PM for a 2:15PM post op check)    Contact information:  [address]   [place] [address] [phone]       Schedule an appointment as soon as possible for a visit with [place], NP. (for ongoing abdominal pain work up)    Specialty:  Nurse Practitioner   Contact information:   [address] [place] [address] [phone]       Signed: Erby Pian, [facility] [phone]  02/01/2014, 7:54 AM

## 2014-02-01 NOTE — Progress Notes (Signed)
Discharge home, tolerated regular tray this am, no nausea and vomiting.pain controlled with po pain med. Home discharge instruction given, no questions verbalized.

## 2014-02-01 NOTE — Discharge Instructions (Signed)

## 2014-02-01 NOTE — Discharge Summary (Signed)
Katie Woodard. Georgette Dover, MD, [facility]  General/ Trauma Surgery  02/01/2014 10:57 AM

## 2014-02-26 ENCOUNTER — Ambulatory Visit (INDEPENDENT_AMBULATORY_CARE_PROVIDER_SITE_OTHER): Payer: Self-pay | Admitting: General Surgery

## 2014-02-26 ENCOUNTER — Encounter (INDEPENDENT_AMBULATORY_CARE_PROVIDER_SITE_OTHER): Payer: Self-pay

## 2014-02-26 VITALS — BP 142/90 | HR 72 | Temp 98.4°F | Resp 14 | Ht 59.0 in | Wt 205.8 lb

## 2014-02-26 DIAGNOSIS — K353 Acute appendicitis with localized peritonitis, without perforation or gangrene: Secondary | ICD-10-CM

## 2014-02-26 DIAGNOSIS — K3533 Acute appendicitis with perforation and localized peritonitis, with abscess: Secondary | ICD-10-CM

## 2014-02-26 NOTE — Patient Instructions (Signed)
Keep the sites clean and dry.  Keep a dressing over the lower site so it stays dry.  Clean with soap and water a couple times per day.

## 2014-02-26 NOTE — Progress Notes (Signed)
[place] 07-Nov-1965 [id number] 02/26/2014   History of Present Illness: Katie Woodard is a  48 y.o. female who presents today status post lap appy by Dr. Judeth Horn.  Pathology reveal:  Appendix, Other than Incidental - EARLY ACUTE APPENDICITIS.  The patient is tolerating a regular diet, having normal bowel movements, has good pain control.  She  is back to most normal activities.   Physical Exam:  BP 142/90  Pulse 72  Temp(Src) 98.4 F (36.9 C) (Oral)  Resp 14  Ht 4\' 11"  (1.499 m)  Wt 93.35 kg (205 lb 12.8 oz)  BMI 41.54 kg/m2  Abd: soft, nontender, active bowel sounds, nondistended.  All incisions are well healed.  Impression: 1.  Acute appendicitis, s/p lap appy  Plan: She  is able to return to normal activities. She  may follow up on a prn basis.

## 2014-05-27 ENCOUNTER — Encounter (INDEPENDENT_AMBULATORY_CARE_PROVIDER_SITE_OTHER): Payer: Self-pay

## 2015-08-05 ENCOUNTER — Encounter (HOSPITAL_COMMUNITY): Payer: Self-pay | Admitting: Emergency Medicine

## 2015-08-05 ENCOUNTER — Emergency Department (HOSPITAL_COMMUNITY): Payer: BC Managed Care – PPO

## 2015-08-05 ENCOUNTER — Emergency Department (HOSPITAL_COMMUNITY)
Admission: EM | Admit: 2015-08-05 | Discharge: 2015-08-05 | Disposition: A | Discharge status: Home / Self Care | Payer: BC Managed Care – PPO | Attending: Emergency Medicine | Admitting: Emergency Medicine

## 2015-08-05 DIAGNOSIS — Z86018 Personal history of other benign neoplasm: Secondary | ICD-10-CM | POA: Insufficient documentation

## 2015-08-05 DIAGNOSIS — S3992XA Unspecified injury of lower back, initial encounter: Secondary | ICD-10-CM | POA: Insufficient documentation

## 2015-08-05 DIAGNOSIS — Y9389 Activity, other specified: Secondary | ICD-10-CM | POA: Insufficient documentation

## 2015-08-05 DIAGNOSIS — S8392XA Sprain of unspecified site of left knee, initial encounter: Secondary | ICD-10-CM | POA: Insufficient documentation

## 2015-08-05 DIAGNOSIS — T148XXA Other injury of unspecified body region, initial encounter: Secondary | ICD-10-CM

## 2015-08-05 DIAGNOSIS — Z872 Personal history of diseases of the skin and subcutaneous tissue: Secondary | ICD-10-CM | POA: Insufficient documentation

## 2015-08-05 DIAGNOSIS — S8991XA Unspecified injury of right lower leg, initial encounter: Secondary | ICD-10-CM | POA: Diagnosis not present

## 2015-08-05 DIAGNOSIS — S8992XA Unspecified injury of left lower leg, initial encounter: Secondary | ICD-10-CM | POA: Diagnosis present

## 2015-08-05 DIAGNOSIS — W001XXA Fall from stairs and steps due to ice and snow, initial encounter: Secondary | ICD-10-CM | POA: Insufficient documentation

## 2015-08-05 DIAGNOSIS — Y998 Other external cause status: Secondary | ICD-10-CM | POA: Diagnosis not present

## 2015-08-05 DIAGNOSIS — Y9289 Other specified places as the place of occurrence of the external cause: Secondary | ICD-10-CM | POA: Diagnosis not present

## 2015-08-05 DIAGNOSIS — Z8739 Personal history of other diseases of the musculoskeletal system and connective tissue: Secondary | ICD-10-CM | POA: Diagnosis not present

## 2015-08-05 DIAGNOSIS — Z8719 Personal history of other diseases of the digestive system: Secondary | ICD-10-CM | POA: Diagnosis not present

## 2015-08-05 DIAGNOSIS — T148 Other injury of unspecified body region: Secondary | ICD-10-CM | POA: Diagnosis not present

## 2015-08-05 NOTE — ED Notes (Signed)
Pt sts bilateral knee pain after slipping and falling on ice on Saturday

## 2015-08-05 NOTE — Discharge Instructions (Signed)
Please read and follow all provided instructions.  Your diagnoses today include:  1. Knee sprain, left, initial encounter   2. Contusion     Tests performed today include:  An x-ray of the affected area - does NOT show any broken bones  Vital signs. See below for your results today.   Medications prescribed:   None  Take any prescribed medications only as directed.  Home care instructions:   Follow any educational materials contained in this packet  Follow R.I.C.E. Protocol:  R - rest your injury   I  - use ice on injury without applying directly to skin  C - compress injury with bandage or splint  E - elevate the injury as much as possible  Follow-up instructions: Please follow-up with your primary care provider or the provided orthopedic physician (bone specialist) if you continue to have significant pain in 1 week. In this case you may have a more severe injury that requires further care.   Return instructions:   Please return if your toes or feet are numb or tingling, appear gray or blue, or you have severe pain (also elevate the leg and loosen splint or wrap if you were given one)  Please return to the Emergency Department if you experience worsening symptoms.   Please return if you have any other emergent concerns.  Additional Information:  Your vital signs today were: BP 139/82 mmHg   Pulse 96   Temp(Src) 99.5 F (37.5 C) (Oral)   Resp 18   SpO2 97% If your blood pressure (BP) was elevated above 135/85 this visit, please have this repeated by your doctor within one month. --------------

## 2015-08-05 NOTE — ED Provider Notes (Signed)
CSN: [id number]     Arrival date & time 08/05/15  1434 History  By signing my name below, I, Eustaquio Maize, attest that this documentation has been prepared under the direction and in the presence of [employer], PA-C. Electronically Signed: Eustaquio Maize, ED Scribe. 08/05/2015. 3:47 PM.   Chief Complaint  Patient presents with  . Fall  . Knee Pain   The history is provided by the patient. No language interpreter was used.     HPI Comments: Katie Woodard is a 50 y.o. female who presents to the Emergency Department complaining of sudden onset, constant, bilateral knee pain, worse on left x 4 days. Pt reports that she was walking down the steps outside of her house when slipped on ice and fell, landing onto her right knee. Upon slipping, pt twisted her left knee causing pain to it. Pt has been applying heat and ice and taking Tylenol without relief. She also complains of left lower back pain. Pt is able to ambulate but reports difficulty due to pain. No hx previous back issues. Denies fever, chills, urinary or bowel incontinence, weakness, numbness, tingling, or any other associated symptoms.   Past Medical History  Diagnosis Date  . Diverticulitis   . Headache   . DDD (degenerative disc disease), cervical   . Uterine fibroid   . Ovarian cyst   . GERD (gastroesophageal reflux disease)     Takes OTC meds   Past Surgical History  Procedure Laterality Date  . Cesarean section  1988  . Cholecystectomy  2005    [facility]  . Laparoscopic appendectomy N/A 01/31/2014    Procedure: APPENDECTOMY LAPAROSCOPIC;  Surgeon: Gwenyth Ober, MD;  Location: [facility] OR;  Service: General;  Laterality: N/A;   Family History  Problem Relation Age of Onset  . Cancer Mother     breast  . Cancer Sister     breast x2  . Cancer Maternal Aunt     ovarian   Social History  Substance Use Topics  . Smoking status: Never Smoker   . Smokeless tobacco: Never Used  . Alcohol Use: No   OB History    Gravida Para  Term Preterm AB TAB SAB Ectopic Multiple Living   4 4 4             Review of Systems  Constitutional: Negative for fever, chills and activity change.  Gastrointestinal:       Negative for bowel incontinence  Genitourinary:       Negative for urinary incontinence  Musculoskeletal: Positive for back pain and arthralgias (Bilateral knee pain). Negative for joint swelling and neck pain.  Skin: Negative for wound.  Neurological: Negative for weakness and numbness.   Allergies  Review of patient's allergies indicates no known allergies.  Home Medications   Prior to Admission medications   Medication Sig Start Date End Date Taking? Authorizing Provider  acetaminophen (TYLENOL) 500 MG tablet Take 1,000 mg by mouth every 6 (six) hours as needed for moderate pain.    Historical Provider, MD  HYDROcodone-acetaminophen (NORCO/VICODIN) 5-325 MG per tablet Take 1-2 tablets by mouth every 6 (six) hours as needed for moderate pain or severe pain. 02/01/14   Emina Riebock, NP  ibuprofen (ADVIL,MOTRIN) 200 MG tablet Take 200 mg by mouth every 6 (six) hours as needed.    Historical Provider, MD  ondansetron (ZOFRAN) 4 MG tablet Take 1 tablet (4 mg total) by mouth every 8 (eight) hours as needed for nausea. 02/01/14   Emina  Riebock, NP   Triage Vitals:  BP 139/82 mmHg  Pulse 96  Temp(Src) 99.5 F (37.5 C) (Oral)  Resp 18  SpO2 97%   Physical Exam  Constitutional: She appears well-developed and well-nourished. No distress.  HENT:  Head: Normocephalic and atraumatic.  Eyes: Conjunctivae and EOM are normal. Pupils are equal, round, and reactive to light.  Neck: Normal range of motion. Neck supple. No tracheal deviation present.  Cardiovascular: Normal rate.  Exam reveals no decreased pulses.   Pulmonary/Chest: Effort normal. No respiratory distress.  Musculoskeletal: She exhibits tenderness. She exhibits no edema.       Right hip: Normal.       Left hip: Normal.       Right knee: Tenderness  (Mild, nonfocal) found.       Left knee: She exhibits decreased range of motion. She exhibits no swelling and no effusion. Tenderness found. Medial joint line tenderness noted. No lateral joint line tenderness noted.       Right ankle: Normal.       Left ankle: Normal.       Cervical back: Normal.       Thoracic back: Normal.       Lumbar back: She exhibits tenderness. She exhibits normal range of motion and no bony tenderness.       Back:       Legs: Neurological: She is alert. No sensory deficit.  Motor, sensation, and vascular distal to the injury is fully intact.   Skin: Skin is warm and dry.  Psychiatric: She has a normal mood and affect. Her behavior is normal.  Nursing note and vitals reviewed.   ED Course  Procedures (including critical care time)  DIAGNOSTIC STUDIES: Oxygen Saturation is 97% on RA, normal by my interpretation.    COORDINATION OF CARE: 3:46 PM-Discussed treatment plan which includes DG L Knee with pt at bedside and pt agreed to plan.   Labs Review Labs Reviewed - No data to display  Imaging Review Dg Knee Complete 4 Views Left  08/05/2015  CLINICAL DATA:  50 year old female with left knee pain after slipping and falling on the ice on 08/02/2015 EXAM: LEFT KNEE - COMPLETE 4+ VIEW COMPARISON:  None. FINDINGS: There is no evidence of fracture, dislocation, or joint effusion. There is no evidence of arthropathy or other focal bone abnormality. Soft tissues are unremarkable. IMPRESSION: Negative. Electronically Signed   By: Jacqulynn Cadet M.D.   On: 08/05/2015 16:20   I have personally reviewed and evaluated these images as part of my medical decision-making.   EKG Interpretation None       Vital signs reviewed and are as follows: Filed Vitals:   08/05/15 1450  BP: 139/82  Pulse: 96  Temp: 99.5 F (37.5 C)  Resp: 18   4:42 PM Patient was counseled on RICE protocol and told to rest injury, use ice for no longer than 15 minutes every hour,  compress the area, and elevate above the level of their heart as much as possible to reduce swelling. Questions answered. Patient verbalized understanding.    Knee sleeve given for comfort. Encouraged orthopedic follow-up in one week not improved.  MDM   Final diagnoses:  Knee sprain, left, initial encounter  Contusion   Knee injury: Twisting injury. X-rays negative. Do not suspect occult tibial plateau fracture. Patient has reasonable range of motion and is ambulatory. Right knee pain is likely secondary to contusion. Lower back pain is mild without red flags.  I personally performed  the services described in this documentation, which was scribed in my presence. The recorded information has been reviewed and is accurate.      Carlisle Cater, PA-C 08/05/15 1643  Carmin Muskrat, MD 08/06/15 [phone]

## 2015-08-05 NOTE — ED Notes (Signed)
See PA note for secondary assessment.   

## 2016-04-02 ENCOUNTER — Encounter (HOSPITAL_COMMUNITY): Payer: Self-pay | Admitting: Physical Medicine and Rehabilitation

## 2016-04-02 ENCOUNTER — Emergency Department (HOSPITAL_COMMUNITY)
Admission: EM | Admit: 2016-04-02 | Discharge: 2016-04-02 | Disposition: A | Discharge status: Home / Self Care | Payer: BC Managed Care – PPO | Attending: Emergency Medicine | Admitting: Emergency Medicine

## 2016-04-02 DIAGNOSIS — M25552 Pain in left hip: Secondary | ICD-10-CM | POA: Diagnosis present

## 2016-04-02 DIAGNOSIS — M5432 Sciatica, left side: Secondary | ICD-10-CM | POA: Insufficient documentation

## 2016-04-02 MED ORDER — METHOCARBAMOL 500 MG PO TABS: 500.0000 mg | ORAL_TABLET | Freq: Two times a day (BID) | ORAL | 0 refills | Status: DC

## 2016-04-02 MED ORDER — KETOROLAC TROMETHAMINE 60 MG/2ML IM SOLN
60.0000 mg | Freq: Once | INTRAMUSCULAR | Status: AC
  Administered 2016-04-02: 60 mg via INTRAMUSCULAR
  Filled 2016-04-02: qty 2

## 2016-04-02 MED ORDER — LIDOCAINE 5 % EX PTCH: 1.0000 | MEDICATED_PATCH | CUTANEOUS | 0 refills | Status: DC

## 2016-04-02 MED ORDER — PREDNISONE 10 MG (21) PO TBPK: 10.0000 mg | ORAL_TABLET | Freq: Every day | ORAL | 0 refills | Status: DC

## 2016-04-02 MED ORDER — NAPROXEN 500 MG PO TABS: 500.0000 mg | ORAL_TABLET | Freq: Two times a day (BID) | ORAL | 0 refills | Status: DC

## 2016-04-02 MED ORDER — PREDNISONE 20 MG PO TABS
60.0000 mg | ORAL_TABLET | Freq: Once | ORAL | Status: AC
  Administered 2016-04-02: 60 mg via ORAL
  Filled 2016-04-02: qty 3

## 2016-04-02 NOTE — ED Triage Notes (Signed)
Pt reports L sided hip pain x1 week. Denies recent injury. States pain increases with walking.

## 2016-04-02 NOTE — ED Notes (Signed)
Pt ambulated to room from waiting room, tolerated well. 

## 2016-04-02 NOTE — ED Notes (Signed)
Pt comfortable with discharge and follow up instructions. Pt declines wheelchair, escorted to waiting area by this RN. Rx x4

## 2016-04-02 NOTE — Discharge Instructions (Signed)
Take it easy, but do not lay around too much as this may make the stiffness worse. Take 500 mg of naproxen every 12 hours or 800 mg of ibuprofen every 8 hours for the next 3 days. Take these medications with food to avoid upset stomach. Robaxin is a muscle relaxer and may help loosen stiff muscles. Do not take the Robaxin while driving or performing other dangerous activities.  Follow-up with orthopedics should symptoms fail to resolve or symptoms continue to recur.

## 2016-04-02 NOTE — ED Provider Notes (Signed)
[facility] DEPT Provider Note   CSN: [id number] Arrival date & time: 04/02/16  1408  By signing my name below, I, Katie Woodard, attest that this documentation has been prepared under the direction and in the presence of non-physician practitioner, Arlean Hopping, PA-C. Electronically Signed: Dolores Woodard, Scribe. 04/02/2016. 2:16 PM.  History   Chief Complaint Chief Complaint  Patient presents with  . Hip Pain   The history is provided by the patient. No language interpreter was used.    HPI Comments:  Katie Woodard AGE is a 50 y.o. female who presents to the Emergency Department complaining of constant worsening left posterior hip/back pain onset past week. Patient has never been seen for her back pain in the past. Pt describes the pain as a soreness, moderate to severe, radiating down the back of the left leg. Worse with walking and sitting. No alleviating factors indicated. She denies any numbness, weakness, recent trauma, changes in bowel or bladder function, or any other complaints.     Past Medical History:  Diagnosis Date  . DDD (degenerative disc disease), cervical   . Diverticulitis   . GERD (gastroesophageal reflux disease)    Takes OTC meds  . Headache   . Ovarian cyst   . Uterine fibroid     Patient Active Problem List   Diagnosis Date Noted  . Abdominal pain 01/31/2014  . Abdominal pain, chronic, right lower quadrant 01/31/2014  . OBESITY 11/11/2009  . ABSCESS, TOOTH 11/11/2009  . DIVERTICULITIS OF COLON 02/26/2009  . RECTAL BLEEDING 02/12/2009  . ABDOMINAL PAIN, CHRONIC 02/12/2009  . ANEMIA 11/13/2008  . MIGRAINE HEADACHE 11/13/2008  . HEMOCCULT POSITIVE STOOL 11/13/2008  . MICROSCOPIC HEMATURIA 11/13/2008  . VAGINITIS, BACTERIAL 11/13/2008  . GERD 10/18/2008    Past Surgical History:  Procedure Laterality Date  . CESAREAN SECTION  1988  . CHOLECYSTECTOMY  2005   [facility]  . LAPAROSCOPIC APPENDECTOMY N/A 01/31/2014   Procedure: APPENDECTOMY LAPAROSCOPIC;   Surgeon: Gwenyth Ober, MD;  Location: [facility];  Service: General;  Laterality: N/A;    OB History    Gravida Para Term Preterm AB Living   4 4 4          SAB TAB Ectopic Multiple Live Births                   Home Medications    Prior to Admission medications   Medication Sig Start Date End Date Taking? Authorizing Provider  acetaminophen (TYLENOL) 500 MG tablet Take 1,000 mg by mouth every 6 (six) hours as needed for moderate pain.    Historical Provider, MD  HYDROcodone-acetaminophen (NORCO/VICODIN) 5-325 MG per tablet Take 1-2 tablets by mouth every 6 (six) hours as needed for moderate pain or severe pain. 02/01/14   Emina Riebock, NP  ibuprofen (ADVIL,MOTRIN) 200 MG tablet Take 200 mg by mouth every 6 (six) hours as needed.    Historical Provider, MD  lidocaine (LIDODERM) 5 % Place 1 patch onto the skin daily. Remove & Discard patch within 12 hours or as directed by MD 04/02/16   Lorayne Bender, PA-C  methocarbamol (ROBAXIN) 500 MG tablet Take 1 tablet (500 mg total) by mouth 2 (two) times daily. 04/02/16   Fotios Amos C Miliyah Luper, PA-C  naproxen (NAPROSYN) 500 MG tablet Take 1 tablet (500 mg total) by mouth 2 (two) times daily. 04/02/16   Jorgina Binning C Carmino Ocain, PA-C  ondansetron (ZOFRAN) 4 MG tablet Take 1 tablet (4 mg total) by mouth every 8 (eight) hours as  needed for nausea. 02/01/14   Emina Riebock, NP  predniSONE (STERAPRED UNI-PAK 21 TAB) 10 MG (21) TBPK tablet Take 1 tablet (10 mg total) by mouth daily. Take 6 tabs by mouth daily  for 2 days, then 5 tabs for 2 days, then 4 tabs for 2 days, then 3 tabs for 2 days, 2 tabs for 2 days, then 1 tab by mouth daily for 2 days 04/02/16   Lorayne Bender, PA-C    Family History Family History  Problem Relation Age of Onset  . Cancer Mother     breast  . Cancer Sister     breast x2  . Cancer Maternal Aunt     ovarian    Social History Social History  Substance Use Topics  . Smoking status: Never Smoker  . Smokeless tobacco: Never Used  . Alcohol use No      Allergies   Review of patient's allergies indicates no known allergies.   Review of Systems Review of Systems  Musculoskeletal: Positive for arthralgias and myalgias.  Skin: Negative for wound.  Neurological: Negative for weakness and numbness.     Physical Exam Updated Vital Signs BP 178/100 (BP Location: Left Arm)   Pulse 76   Temp 98 F (36.7 C) (Oral)   Resp 17   Ht 4\' 11"  (1.499 m)   Wt 92.1 kg   SpO2 100%   BMI 41.00 kg/m   Physical Exam  Constitutional: She appears well-developed and well-nourished. No distress.  HENT:  Head: Normocephalic and atraumatic.  Eyes: Conjunctivae are normal.  Neck: Neck supple.  Cardiovascular: Normal rate and regular rhythm.   Pulmonary/Chest: Effort normal.  Musculoskeletal:  Tenderness to left sacral musculature. Motor, sensory, and circulation intact. Strength is 5/5 bilaterally.   Neurological: She is alert.  Skin: Skin is warm and dry. She is not diaphoretic.  Psychiatric: She has a normal mood and affect. Her behavior is normal.  Nursing note and vitals reviewed.    ED Treatments / Results  DIAGNOSTIC STUDIES:  Oxygen Saturation is 99% on RA, normal by my interpretation.    COORDINATION OF CARE:  2:33 PM Discussed treatment plan with pt at bedside which included NSAIDs, steroids, and muscle relaxants as well as referral to Orthopedics and pt agreed to plan.  Labs (all labs ordered are listed, but only abnormal results are displayed) Labs Reviewed - No data to display  EKG  EKG Interpretation None       Radiology No results found.  Procedures Procedures (including critical care time)  Medications Ordered in ED Medications  ketorolac (TORADOL) injection 60 mg (60 mg Intramuscular Given 04/02/16 1447)  predniSONE (DELTASONE) tablet 60 mg (60 mg Oral Given 04/02/16 1447)     Initial Impression / Assessment and Plan / ED Course  I have reviewed the triage vital signs and the nursing  notes.  Pertinent labs & imaging results that were available during my care of the patient were reviewed by me and considered in my medical decision making (see chart for details).  Clinical Course   Patient with symptoms of sciatica. No red flag symptoms. Patient to follow-up with orthopedics. The patient was given instructions for home care as well as return precautions. Patient voices understanding of these instructions, accepts the plan, and is comfortable with discharge.  Final Clinical Impressions(s) / ED Diagnoses   Final diagnoses:  Sciatica of left side    New Prescriptions Discharge Medication List as of 04/02/2016  2:57 PM  START taking these medications   Details  lidocaine (LIDODERM) 5 % Place 1 patch onto the skin daily. Remove & Discard patch within 12 hours or as directed by MD, Starting Fri 04/02/2016, Print    methocarbamol (ROBAXIN) 500 MG tablet Take 1 tablet (500 mg total) by mouth 2 (two) times daily., Starting Fri 04/02/2016, Print    naproxen (NAPROSYN) 500 MG tablet Take 1 tablet (500 mg total) by mouth 2 (two) times daily., Starting Fri 04/02/2016, Print    predniSONE (STERAPRED UNI-PAK 21 TAB) 10 MG (21) TBPK tablet Take 1 tablet (10 mg total) by mouth daily. Take 6 tabs by mouth daily  for 2 days, then 5 tabs for 2 days, then 4 tabs for 2 days, then 3 tabs for 2 days, 2 tabs for 2 days, then 1 tab by mouth daily for 2 days, Starting Fri 04/02/2016, Print      I personally performed the services described in this documentation, which was scribed in my presence. The recorded information has been reviewed and is accurate.    Lorayne Bender, PA-C 04/02/16 1719    Julianne Rice, MD 04/07/16 [phone]

## 2016-11-06 ENCOUNTER — Emergency Department (HOSPITAL_COMMUNITY)
Admission: EM | Admit: 2016-11-06 | Discharge: 2016-11-06 | Disposition: A | Discharge status: Home / Self Care | Payer: BC Managed Care – PPO | Attending: Emergency Medicine | Admitting: Emergency Medicine

## 2016-11-06 ENCOUNTER — Encounter (HOSPITAL_COMMUNITY): Payer: Self-pay

## 2016-11-06 ENCOUNTER — Emergency Department (HOSPITAL_COMMUNITY): Payer: BC Managed Care – PPO

## 2016-11-06 DIAGNOSIS — R079 Chest pain, unspecified: Secondary | ICD-10-CM

## 2016-11-06 DIAGNOSIS — Z79899 Other long term (current) drug therapy: Secondary | ICD-10-CM | POA: Diagnosis not present

## 2016-11-06 DIAGNOSIS — Z7982 Long term (current) use of aspirin: Secondary | ICD-10-CM | POA: Diagnosis not present

## 2016-11-06 LAB — COMPREHENSIVE METABOLIC PANEL
ALT: 18 U/L (ref 14–54)
AST: 16 U/L (ref 15–41)
Albumin: 3.9 g/dL (ref 3.5–5.0)
Alkaline Phosphatase: 56 U/L (ref 38–126)
Anion gap: 7 (ref 5–15)
BUN: 10 mg/dL (ref 6–20)
CO2: 25 mmol/L (ref 22–32)
Calcium: 9.1 mg/dL (ref 8.9–10.3)
Chloride: 107 mmol/L (ref 101–111)
Creatinine, Ser: 0.53 mg/dL (ref 0.44–1.00)
GFR calc Af Amer: >60 mL/min (ref >60)
GFR calc non Af Amer: >60 mL/min (ref >60)
Glucose, Bld: 104 mg/dL — ABNORMAL HIGH (ref 65–99)
Potassium: 3.9 mmol/L (ref 3.5–5.1)
Sodium: 139 mmol/L (ref 135–145)
Total Bilirubin: 0.5 mg/dL (ref 0.3–1.2)
Total Protein: 7 g/dL (ref 6.5–8.1)

## 2016-11-06 LAB — CBC
HCT: 36.9 % (ref 36.0–46.0)
Hemoglobin: 11.5 g/dL — ABNORMAL LOW (ref 12.0–15.0)
MCH: 21.1 pg — ABNORMAL LOW (ref 26.0–34.0)
MCHC: 31.2 g/dL (ref 30.0–36.0)
MCV: 67.7 fL — ABNORMAL LOW (ref 78.0–100.0)
Platelets: 310 10*3/uL (ref 150–400)
RBC: 5.45 MIL/uL — ABNORMAL HIGH (ref 3.87–5.11)
RDW: 17.7 % — ABNORMAL HIGH (ref 11.5–15.5)
WBC: 7.5 10*3/uL (ref 4.0–10.5)

## 2016-11-06 LAB — I-STAT TROPONIN, ED
Troponin i, poc: 0 ng/mL (ref 0.00–0.08)
Troponin i, poc: 0 ng/mL (ref 0.00–0.08)

## 2016-11-06 MED ORDER — ASPIRIN 81 MG PO CHEW
324.0000 mg | CHEWABLE_TABLET | Freq: Once | ORAL | Status: AC
  Administered 2016-11-06: 324 mg via ORAL
  Filled 2016-11-06: qty 4

## 2016-11-06 NOTE — ED Triage Notes (Signed)
Pt c/o chest pain that started this morning. Pt endorse SOB and dizziness. Pt denies n/v or diaphoresis. Pt a&ox4.

## 2016-11-06 NOTE — ED Provider Notes (Signed)
[facility] DEPT Provider Note   CSN: [id number] Arrival date & time: 11/06/16  0856     History   Chief Complaint Chief Complaint  Patient presents with  . Chest Pain    HPI Katie Woodard is a 51 y.o. female.  The history is provided by the patient.  Chest Pain   This is a new problem. The current episode started 1 to 2 hours ago. The problem occurs constantly. Progression since onset: fluctuating; now resolved. The pain is present in the lateral region. The pain is moderate. The quality of the pain is described as sharp. Radiates to: to back and down to left leg. The symptoms are aggravated by exertion. Associated symptoms include nausea and shortness of breath. Pertinent negatives include no cough, no diaphoresis, no fever, no lower extremity edema and no vomiting.  Pertinent negatives for past medical history include no CAD, no CHF, no diabetes, no DVT, no hyperlipidemia, no hypertension, no MI and no PE.  Pertinent negatives for family medical history include: no early MI.  Procedure history is negative for cardiac catheterization and exercise treadmill test.    Past Medical History:  Diagnosis Date  . DDD (degenerative disc disease), cervical   . Diverticulitis   . GERD (gastroesophageal reflux disease)    Takes OTC meds  . Headache   . Ovarian cyst   . Uterine fibroid     Patient Active Problem List   Diagnosis Date Noted  . Abdominal pain 01/31/2014  . Abdominal pain, chronic, right lower quadrant 01/31/2014  . OBESITY 11/11/2009  . ABSCESS, TOOTH 11/11/2009  . DIVERTICULITIS OF COLON 02/26/2009  . RECTAL BLEEDING 02/12/2009  . ABDOMINAL PAIN, CHRONIC 02/12/2009  . ANEMIA 11/13/2008  . MIGRAINE HEADACHE 11/13/2008  . HEMOCCULT POSITIVE STOOL 11/13/2008  . MICROSCOPIC HEMATURIA 11/13/2008  . VAGINITIS, BACTERIAL 11/13/2008  . GERD 10/18/2008    Past Surgical History:  Procedure Laterality Date  . CESAREAN SECTION  1988  . CHOLECYSTECTOMY  2005   [facility]  . LAPAROSCOPIC APPENDECTOMY N/A 01/31/2014   Procedure: APPENDECTOMY LAPAROSCOPIC;  Surgeon: Gwenyth Ober, MD;  Location: [facility];  Service: General;  Laterality: N/A;    OB History    Gravida Para Term Preterm AB Living   4 4 4          SAB TAB Ectopic Multiple Live Births                   Home Medications    Prior to Admission medications   Medication Sig Start Date End Date Taking? Authorizing Provider  acetaminophen (TYLENOL) 500 MG tablet Take 1,000 mg by mouth every 6 (six) hours as needed for moderate pain.   Yes Historical Provider, MD  hydrocortisone cream 1 % Apply 1 application topically as needed for itching.   Yes Historical Provider, MD  sodium chloride (OCEAN) 0.65 % SOLN nasal spray Place 1 spray into both nostrils as needed for congestion.   Yes Historical Provider, MD  HYDROcodone-acetaminophen (NORCO/VICODIN) 5-325 MG per tablet Take 1-2 tablets by mouth every 6 (six) hours as needed for moderate pain or severe pain. Patient not taking: Reported on 11/06/2016 02/01/14   Emina Riebock, NP  lidocaine (LIDODERM) 5 % Place 1 patch onto the skin daily. Remove & Discard patch within 12 hours or as directed by MD Patient not taking: Reported on 11/06/2016 04/02/16   Shawn C Joy, PA-C  methocarbamol (ROBAXIN) 500 MG tablet Take 1 tablet (500 mg total) by mouth 2 (two)  times daily. Patient not taking: Reported on 11/06/2016 04/02/16   Shawn C Joy, PA-C  naproxen (NAPROSYN) 500 MG tablet Take 1 tablet (500 mg total) by mouth 2 (two) times daily. Patient not taking: Reported on 11/06/2016 04/02/16   Shawn C Joy, PA-C  ondansetron (ZOFRAN) 4 MG tablet Take 1 tablet (4 mg total) by mouth every 8 (eight) hours as needed for nausea. Patient not taking: Reported on 11/06/2016 02/01/14   Erby Pian, NP  predniSONE (STERAPRED UNI-PAK 21 TAB) 10 MG (21) TBPK tablet Take 1 tablet (10 mg total) by mouth daily. Take 6 tabs by mouth daily  for 2 days, then 5 tabs for 2 days, then 4 tabs for 2 days,  then 3 tabs for 2 days, 2 tabs for 2 days, then 1 tab by mouth daily for 2 days Patient not taking: Reported on 11/06/2016 04/02/16   Lorayne Bender, PA-C    Family History Family History  Problem Relation Age of Onset  . Cancer Mother     breast  . Cancer Sister     breast x2  . Cancer Maternal Aunt     ovarian    Social History Social History  Substance Use Topics  . Smoking status: Never Smoker  . Smokeless tobacco: Never Used  . Alcohol use No     Allergies   Patient has no known allergies.   Review of Systems Review of Systems  Constitutional: Negative for diaphoresis and fever.  Respiratory: Positive for shortness of breath. Negative for cough.   Cardiovascular: Positive for chest pain.  Gastrointestinal: Positive for nausea. Negative for vomiting.  All other systems are reviewed and are negative for acute change except as noted in the HPI    Physical Exam Updated Vital Signs BP 128/77   Pulse 75   Resp (!) 24   Ht 4\' 11"  (1.499 m)   Wt 205 lb (93 kg)   SpO2 96%   BMI 41.40 kg/m   Physical Exam  Constitutional: She is oriented to person, place, and time. She appears well-developed and well-nourished. No distress.  HENT:  Head: Normocephalic and atraumatic.  Nose: Nose normal.  Eyes: Conjunctivae and EOM are normal. Pupils are equal, round, and reactive to light. Right eye exhibits no discharge. Left eye exhibits no discharge. No scleral icterus.  Neck: Normal range of motion. Neck supple.  Cardiovascular: Normal rate and regular rhythm.  Exam reveals no gallop and no friction rub.   No murmur heard. Pulmonary/Chest: Effort normal and breath sounds normal. No stridor. No respiratory distress. She has no rales. She exhibits tenderness.    Abdominal: Soft. She exhibits no distension. There is no tenderness.  Musculoskeletal: She exhibits no edema or tenderness.  Neurological: She is alert and oriented to person, place, and time.  Skin: Skin is warm and  dry. No rash noted. She is not diaphoretic. No erythema.  Psychiatric: She has a normal mood and affect.  Vitals reviewed.    ED Treatments / Results  Labs (all labs ordered are listed, but only abnormal results are displayed) Labs Reviewed  CBC - Abnormal; Notable for the following:       Result Value   RBC 5.45 (*)    Hemoglobin 11.5 (*)    MCV 67.7 (*)    MCH 21.1 (*)    RDW 17.7 (*)    All other components within normal limits  COMPREHENSIVE METABOLIC PANEL - Abnormal; Notable for the following:    Glucose, Bld 104 (*)  All other components within normal limits  I-STAT TROPOININ, ED  I-STAT TROPOININ, ED    EKG  EKG Interpretation  Date/Time:  Saturday November 06 2016 09:08:05 EDT Ventricular Rate:  77 PR Interval:    QRS Duration: 85 QT Interval:  416 QTC Calculation: 471 R Axis:   31 Text Interpretation:  Sinus rhythm Low voltage, precordial leads No significant change since last tracing Confirmed by [facility] MD, PEDRO ([id number]) on 11/06/2016 9:14:19 AM       Radiology Dg Chest 2 View  Result Date: 11/06/2016 CLINICAL DATA:  51 year old female with history of chest pain this morning for 1 hour. Pain currently resolved. Shortness of breath. EXAM: CHEST  2 VIEW COMPARISON:  No priors. FINDINGS: Mild diffuse peribronchial cuffing. Lung volumes are low. No consolidative airspace disease. No pleural effusions. No pneumothorax. No pulmonary nodule or mass noted. Pulmonary vasculature and the cardiomediastinal silhouette are within normal limits. IMPRESSION: 1. Mild diffuse peribronchial cuffing, suggestive of an acute bronchitis. Electronically Signed   By: Vinnie Langton M.D.   On: 11/06/2016 09:56    Procedures Procedures (including critical care time)  Medications Ordered in ED Medications  aspirin chewable tablet 324 mg (324 mg Oral Given 11/06/16 1010)     Initial Impression / Assessment and Plan / ED Course  I have reviewed the triage vital signs and the  nursing notes.  Pertinent labs & imaging results that were available during my care of the patient were reviewed by me and considered in my medical decision making (see chart for details).     Nonspecific chest pain. EKG without acute ischemic changes or evidence of pericarditis. Initial troponin negative. HEAR <3. Appropriate for delta troponin.  Chest x-ray without evidence suggestive of pneumonia, pneumothorax, pneumomediastinum.  No abnormal contour of the mediastinum to suggest dissection. No evidence of acute injuries.  Low pretest probability for pulmonary embolism. Presentation is classic for aortic dissection or esophageal perforation.  Delta trop negative.  The patient is safe for discharge with strict return precautions.   Final Clinical Impressions(s) / ED Diagnoses   Final diagnoses:  Chest pain  Nonspecific chest pain   Disposition: Discharge  Condition: Good  I have discussed the results, Dx and Tx plan with the patient who expressed understanding and agree(s) with the plan. Discharge instructions discussed at great length. The patient was given strict return precautions who verbalized understanding of the instructions. No further questions at time of discharge.    New Prescriptions   No medications on file    Follow Up: primary care provider  Call  For help establishing care with a care provider and to schedule stress test in within 30 days.      Fatima Blank, MD 11/06/16 [phone]

## 2017-03-30 ENCOUNTER — Encounter (HOSPITAL_COMMUNITY): Payer: Self-pay | Admitting: *Deleted

## 2017-03-30 ENCOUNTER — Emergency Department (HOSPITAL_COMMUNITY)
Admission: EM | Admit: 2017-03-30 | Discharge: 2017-03-30 | Disposition: A | Discharge status: Home / Self Care | Payer: BC Managed Care – PPO | Attending: Emergency Medicine | Admitting: Emergency Medicine

## 2017-03-30 ENCOUNTER — Emergency Department (HOSPITAL_COMMUNITY): Payer: BC Managed Care – PPO

## 2017-03-30 DIAGNOSIS — R197 Diarrhea, unspecified: Secondary | ICD-10-CM | POA: Diagnosis not present

## 2017-03-30 DIAGNOSIS — D649 Anemia, unspecified: Secondary | ICD-10-CM | POA: Diagnosis not present

## 2017-03-30 DIAGNOSIS — K5792 Diverticulitis of intestine, part unspecified, without perforation or abscess without bleeding: Secondary | ICD-10-CM | POA: Diagnosis not present

## 2017-03-30 DIAGNOSIS — Z79899 Other long term (current) drug therapy: Secondary | ICD-10-CM | POA: Diagnosis not present

## 2017-03-30 DIAGNOSIS — R1084 Generalized abdominal pain: Secondary | ICD-10-CM | POA: Diagnosis present

## 2017-03-30 LAB — URINALYSIS, MICROSCOPIC (REFLEX)

## 2017-03-30 LAB — CBC
HCT: 39.9 % (ref 36.0–46.0)
Hemoglobin: 12.5 g/dL (ref 12.0–15.0)
MCH: 20.7 pg — ABNORMAL LOW (ref 26.0–34.0)
MCHC: 31.3 g/dL (ref 30.0–36.0)
MCV: 66.2 fL — ABNORMAL LOW (ref 78.0–100.0)
Platelets: 366 10*3/uL (ref 150–400)
RBC: 6.03 MIL/uL — ABNORMAL HIGH (ref 3.87–5.11)
RDW: 18.3 % — ABNORMAL HIGH (ref 11.5–15.5)
WBC: 13.1 10*3/uL — ABNORMAL HIGH (ref 4.0–10.5)

## 2017-03-30 LAB — COMPREHENSIVE METABOLIC PANEL
ALT: 17 U/L (ref 14–54)
AST: 16 U/L (ref 15–41)
Albumin: 4 g/dL (ref 3.5–5.0)
Alkaline Phosphatase: 63 U/L (ref 38–126)
Anion gap: 9 (ref 5–15)
BUN: 7 mg/dL (ref 6–20)
CO2: 24 mmol/L (ref 22–32)
Calcium: 9 mg/dL (ref 8.9–10.3)
Chloride: 103 mmol/L (ref 101–111)
Creatinine, Ser: 0.55 mg/dL (ref 0.44–1.00)
GFR calc Af Amer: >60 mL/min (ref >60)
GFR calc non Af Amer: >60 mL/min (ref >60)
Glucose, Bld: 124 mg/dL — ABNORMAL HIGH (ref 65–99)
Potassium: 3.7 mmol/L (ref 3.5–5.1)
Sodium: 136 mmol/L (ref 135–145)
Total Bilirubin: 0.6 mg/dL (ref 0.3–1.2)
Total Protein: 8 g/dL (ref 6.5–8.1)

## 2017-03-30 LAB — LIPASE, BLOOD: Lipase: 32 U/L (ref 11–51)

## 2017-03-30 LAB — URINALYSIS, ROUTINE W REFLEX MICROSCOPIC
Bilirubin Urine: NEGATIVE
Glucose, UA: NEGATIVE mg/dL
Ketones, ur: NEGATIVE mg/dL
Leukocytes, UA: NEGATIVE
Nitrite: NEGATIVE
Protein, ur: NEGATIVE mg/dL
Specific Gravity, Urine: 1.015 (ref 1.005–1.030)
pH: 7 (ref 5.0–8.0)

## 2017-03-30 MED ORDER — METRONIDAZOLE 500 MG PO TABS: 500.0000 mg | ORAL_TABLET | Freq: Three times a day (TID) | ORAL | 0 refills | Status: DC

## 2017-03-30 MED ORDER — IOPAMIDOL (ISOVUE-300) INJECTION 61%
INTRAVENOUS | Status: AC
  Administered 2017-03-30: 100 mL via INTRAVENOUS
  Filled 2017-03-30: qty 100

## 2017-03-30 MED ORDER — MORPHINE SULFATE (PF) 4 MG/ML IV SOLN
4.0000 mg | Freq: Once | INTRAVENOUS | Status: AC
  Administered 2017-03-30: 4 mg via INTRAVENOUS
  Filled 2017-03-30: qty 1

## 2017-03-30 MED ORDER — ONDANSETRON 8 MG PO TBDP: 8.0000 mg | ORAL_TABLET | Freq: Three times a day (TID) | ORAL | 0 refills | Status: DC | PRN

## 2017-03-30 MED ORDER — METOCLOPRAMIDE HCL 5 MG/ML IJ SOLN
10.0000 mg | Freq: Once | INTRAMUSCULAR | Status: AC
  Administered 2017-03-30: 10 mg via INTRAVENOUS
  Filled 2017-03-30: qty 2

## 2017-03-30 MED ORDER — CIPROFLOXACIN HCL 500 MG PO TABS: 500.0000 mg | ORAL_TABLET | Freq: Two times a day (BID) | ORAL | 0 refills | Status: DC

## 2017-03-30 MED ORDER — METRONIDAZOLE 500 MG PO TABS
500.0000 mg | ORAL_TABLET | Freq: Once | ORAL | Status: AC
  Administered 2017-03-30: 500 mg via ORAL
  Filled 2017-03-30: qty 1

## 2017-03-30 MED ORDER — CIPROFLOXACIN HCL 500 MG PO TABS
500.0000 mg | ORAL_TABLET | Freq: Once | ORAL | Status: AC
  Administered 2017-03-30: 500 mg via ORAL
  Filled 2017-03-30: qty 1

## 2017-03-30 MED ORDER — HYDROCODONE-ACETAMINOPHEN 5-325 MG PO TABS
1.0000 | ORAL_TABLET | Freq: Once | ORAL | Status: AC
  Administered 2017-03-30: 1 via ORAL
  Filled 2017-03-30: qty 1

## 2017-03-30 MED ORDER — HYDROCODONE-ACETAMINOPHEN 5-325 MG PO TABS: 1.0000 | ORAL_TABLET | Freq: Four times a day (QID) | ORAL | 0 refills | Status: DC | PRN

## 2017-03-30 MED ORDER — SODIUM CHLORIDE 0.9 % IV BOLUS (SEPSIS)
1000.0000 mL | Freq: Once | INTRAVENOUS | Status: AC
  Administered 2017-03-30: 1000 mL via INTRAVENOUS

## 2017-03-30 NOTE — Discharge Instructions (Signed)
You have been diagnosed with diverticulitis on your CT scan. We will need to take antibiotics as prescribed including Cipro and Flagyl. Have given you Zofran to take for nausea. Small course of pain medicine. May take over-the-counter anti-inflammatories including NSAIDs. Clear liquid diet for 24 hours and advance her diet as tolerated. Follow-up with the primary care doctor. Return to the ED if he developed any worsening symptoms including fevers, worsening pain, worsening vomiting.

## 2017-03-30 NOTE — ED Provider Notes (Signed)
[facility] DEPT Provider Note   CSN: [id number] Arrival date & time: 03/30/17  0810     History   Chief Complaint Chief Complaint  Patient presents with  . Abdominal Pain  . Diarrhea    HPI Katie Woodard is a 51 y.o. female.  HPI 51 year old African American female past medical history past medical history significant for GERD, diverticulitis, appendectomy, cholecystectomy that presents to the emergency Department today with complaints of generalized abdominal pain. Patient states that approximately 4 days ago her symptoms started. States that it was left-sided abdominal pain that has since radiated to the entire abdomen. She reports constipation last week. States that she took a laxative over the weekend and now has diarrhea. She reports nausea and 3 episodes of nonbloody nonbilious emesis yesterday. Denies any melena or hematochezia. The patient has not taken anything for her abdominal pain. Eating and palpation make the pain worse. Nothing makes the pain better. Patient denies any associated urinary symptoms, fever, recent travel, recent antibiotic use, recent hospitalization.  Pt denies any fever, chill, ha, vision changes, lightheadedness, dizziness, congestion, neck pain, cp, sob, cough, urinary symptoms, vaginal bleeding, vaginal discharge, melena, hematochezia, lower extremity paresthesias.  Past Medical History:  Diagnosis Date  . DDD (degenerative disc disease), cervical   . Diverticulitis   . GERD (gastroesophageal reflux disease)    Takes OTC meds  . Headache   . Ovarian cyst   . Uterine fibroid     Patient Active Problem List   Diagnosis Date Noted  . Abdominal pain 01/31/2014  . Abdominal pain, chronic, right lower quadrant 01/31/2014  . OBESITY 11/11/2009  . ABSCESS, TOOTH 11/11/2009  . DIVERTICULITIS OF COLON 02/26/2009  . RECTAL BLEEDING 02/12/2009  . ABDOMINAL PAIN, CHRONIC 02/12/2009  . ANEMIA 11/13/2008  . MIGRAINE HEADACHE 11/13/2008  .  HEMOCCULT POSITIVE STOOL 11/13/2008  . MICROSCOPIC HEMATURIA 11/13/2008  . VAGINITIS, BACTERIAL 11/13/2008  . GERD 10/18/2008    Past Surgical History:  Procedure Laterality Date  . CESAREAN SECTION  1988  . CHOLECYSTECTOMY  2005   [facility]  . LAPAROSCOPIC APPENDECTOMY N/A 01/31/2014   Procedure: APPENDECTOMY LAPAROSCOPIC;  Surgeon: Gwenyth Ober, MD;  Location: [facility];  Service: General;  Laterality: N/A;    OB History    Gravida Para Term Preterm AB Living   4 4 4          SAB TAB Ectopic Multiple Live Births                   Home Medications    Prior to Admission medications   Medication Sig Start Date End Date Taking? Authorizing Provider  acetaminophen (TYLENOL) 500 MG tablet Take 1,000 mg by mouth every 6 (six) hours as needed for moderate pain.    [provider]  HYDROcodone-acetaminophen (NORCO/VICODIN) 5-325 MG per tablet Take 1-2 tablets by mouth every 6 (six) hours as needed for moderate pain or severe pain. Patient not taking: Reported on 11/06/2016 02/01/14   Erby Pian, NP  hydrocortisone cream 1 % Apply 1 application topically as needed for itching.    [provider]  lidocaine (LIDODERM) 5 % Place 1 patch onto the skin daily. Remove & Discard patch within 12 hours or as directed by MD Patient not taking: Reported on 11/06/2016 04/02/16   Joy, Helane Gunther, PA-C  methocarbamol (ROBAXIN) 500 MG tablet Take 1 tablet (500 mg total) by mouth 2 (two) times daily. Patient not taking: Reported on 11/06/2016 04/02/16   Lorayne Bender,  PA-C  naproxen (NAPROSYN) 500 MG tablet Take 1 tablet (500 mg total) by mouth 2 (two) times daily. Patient not taking: Reported on 11/06/2016 04/02/16   Joy, Shawn C, PA-C  ondansetron (ZOFRAN) 4 MG tablet Take 1 tablet (4 mg total) by mouth every 8 (eight) hours as needed for nausea. Patient not taking: Reported on 11/06/2016 02/01/14   Riebock, Estill Bakes, NP  predniSONE (STERAPRED UNI-PAK 21 TAB) 10 MG (21) TBPK tablet Take 1 tablet (10 mg total)  by mouth daily. Take 6 tabs by mouth daily  for 2 days, then 5 tabs for 2 days, then 4 tabs for 2 days, then 3 tabs for 2 days, 2 tabs for 2 days, then 1 tab by mouth daily for 2 days Patient not taking: Reported on 11/06/2016 04/02/16   Arlean Hopping C, PA-C  sodium chloride (OCEAN) 0.65 % SOLN nasal spray Place 1 spray into both nostrils as needed for congestion.    [provider]    Family History Family History  Problem Relation Age of Onset  . Cancer Mother        breast  . Cancer Sister        breast x2  . Cancer Maternal Aunt        ovarian    Social History Social History  Substance Use Topics  . Smoking status: Never Smoker  . Smokeless tobacco: Never Used  . Alcohol use No     Allergies   Patient has no known allergies.   Review of Systems Review of Systems  Constitutional: Negative for chills and fever.  HENT: Negative for congestion.   Eyes: Negative for visual disturbance.  Respiratory: Negative for cough and shortness of breath.   Cardiovascular: Negative for chest pain.  Gastrointestinal: Positive for abdominal pain, diarrhea, nausea and vomiting. Negative for blood in stool and constipation.  Genitourinary: Negative for dysuria, flank pain, frequency, hematuria, urgency, vaginal bleeding and vaginal discharge.  Musculoskeletal: Negative for arthralgias and myalgias.  Skin: Negative for rash.  Neurological: Negative for dizziness, syncope, weakness, light-headedness, numbness and headaches.  Psychiatric/Behavioral: Negative for sleep disturbance. The patient is not nervous/anxious.      Physical Exam Updated Vital Signs BP (!) 149/110 (BP Location: Left Arm)   Pulse (!) 104   Temp 98 F (36.7 C) (Oral)   Resp 16   SpO2 94%   Physical Exam  Constitutional: She is oriented to person, place, and time. She appears well-developed and well-nourished.  Non-toxic appearance. No distress.  HENT:  Head: Normocephalic and atraumatic.  Nose: Nose  normal.  Mouth/Throat: Oropharynx is clear and moist.  Eyes: Pupils are equal, round, and reactive to light. Conjunctivae are normal. Right eye exhibits no discharge. Left eye exhibits no discharge.  Neck: Normal range of motion. Neck supple.  Cardiovascular: Normal rate, regular rhythm, normal heart sounds and intact distal pulses.  Exam reveals no gallop and no friction rub.   No murmur heard. Pulmonary/Chest: Effort normal and breath sounds normal. No respiratory distress. She has no wheezes. She has no rales. She exhibits no tenderness.  Abdominal: Soft. Bowel sounds are normal. There is generalized tenderness. There is guarding. There is no rigidity, no rebound, no CVA tenderness, no tenderness at McBurney's point and negative Murphy's sign.  Obese  Musculoskeletal: Normal range of motion. She exhibits no tenderness.  Lymphadenopathy:    She has no cervical adenopathy.  Neurological: She is alert and oriented to person, place, and time.  Skin: Skin is warm and  dry. Capillary refill takes less than 2 seconds.  Psychiatric: Her behavior is normal. Judgment and thought content normal.  Nursing note and vitals reviewed.    ED Treatments / Results  Labs (all labs ordered are listed, but only abnormal results are displayed) Labs Reviewed  CBC - Abnormal; Notable for the following:       Result Value   WBC 13.1 (*)    RBC 6.03 (*)    MCV 66.2 (*)    MCH 20.7 (*)    RDW 18.3 (*)    All other components within normal limits  LIPASE, BLOOD  COMPREHENSIVE METABOLIC PANEL  URINALYSIS, ROUTINE W REFLEX MICROSCOPIC    EKG  EKG Interpretation None       Radiology No results found.  Procedures Procedures (including critical care time)  Medications Ordered in ED Medications  sodium chloride 0.9 % bolus 1,000 mL (1,000 mLs Intravenous New Bag/Given 03/30/17 0914)  morphine 4 MG/ML injection 4 mg (4 mg Intravenous Given 03/30/17 0914)  metoCLOPramide (REGLAN) injection 10 mg (10 mg  Intravenous Given 03/30/17 0915)  iopamidol (ISOVUE-300) 61 % injection (100 mLs Intravenous Contrast Given 03/30/17 1004)  metroNIDAZOLE (FLAGYL) tablet 500 mg (500 mg Oral Given 03/30/17 1238)  ciprofloxacin (CIPRO) tablet 500 mg (500 mg Oral Given 03/30/17 1237)  HYDROcodone-acetaminophen (NORCO/VICODIN) 5-325 MG per tablet 1 tablet (1 tablet Oral Given 03/30/17 1237)  morphine 4 MG/ML injection 4 mg (4 mg Intravenous Given 03/30/17 1239)     Initial Impression / Assessment and Plan / ED Course  I have reviewed the triage vital signs and the nursing notes.  Pertinent labs & imaging results that were available during my care of the patient were reviewed by me and considered in my medical decision making (see chart for details).     Patient resents to the ED with complaints of generalized abdominal pain, nausea, diarrhea. Patient denies any associated complaints of fever, melena, hematochezia.  Patient is overall well-appearing and nontoxic. Vital signs are reassuring. Patient is afebrile.  Patient with mild leukocytosis of 13,000. Electrolytes are reassuring. Kidney function is normal. UA with small amount of hemoglobin, WBCs, many bacteria, many squamous epithelium. Patient denies any urinary symptoms. Likely asymptomatic bacteriuria however we will culture urine and not treat at this time.  CT scan obtained due to patient's abdominal tenderness and guarding. CT scan of abdomen reveals acute diverticulitis but is uncomplicated.   The patient's symptoms have been managed in the ED. Patient is pain-free at this time. Repeat abdominal exam is benign without any signs of peritonitis. Patient has been ambulating with normal gait. She is able tolerate by mouth fluids and food without any difficulties. Vital signs remained reassuring. Given the patient is afebrile, mild leukocytosis and uncomplicated diverticulitis the patient can be treated in the outpatient setting. Was sent home with Flagyl and Cipro.  We'll give supportive treatment with nausea meds and pain medicine.  Pt is hemodynamically stable, in NAD, & able to ambulate in the ED. Evaluation does not show pathology that would require ongoing emergent intervention or inpatient treatment. I explained the diagnosis to the patient. Pain has been managed & has no complaints prior to dc. Pt is comfortable with above plan and is stable for discharge at this time. All questions were answered prior to disposition. Strict return precautions for f/u to the ED were discussed. Encouraged follow up with PCP.   Final Clinical Impressions(s) / ED Diagnoses   Final diagnoses:  Diverticulitis    New Prescriptions  New Prescriptions   No medications on file     Aaron Edelman 03/30/17 1332    Lajean Saver, MD 03/31/17 [phone]

## 2017-03-30 NOTE — ED Notes (Signed)
C/o abd. Pain on set Thurs, constipation used a laxative  Then started having loose stools.

## 2017-04-01 LAB — URINE CULTURE: Culture: >=100000 — AB

## 2017-04-02 ENCOUNTER — Telehealth: Payer: Self-pay | Admitting: Emergency Medicine

## 2017-04-02 NOTE — Telephone Encounter (Signed)
Post ED Visit - Positive Culture Follow-up  Culture report reviewed by antimicrobial stewardship pharmacist:  []  Elenor Quinones, Pharm.D. []  Heide Guile, Pharm.D., BCPS AQ-ID []  Parks Neptune, Pharm.D., BCPS []  Alycia Rossetti, Pharm.D., BCPS []  [place], [place].D., BCPS, AAHIVP []  Legrand Como, Pharm.D., BCPS, AAHIVP [x]  Salome Arnt, PharmD, BCPS []  Dimitri Ped, PharmD, BCPS []  Vincenza Hews, PharmD, BCPS  Positive urine culture Treated with metronidazole and ciprofloxacin, organism sensitive to the same and no further patient follow-up is required at this time.  Hazle Nordmann 04/02/2017, 11:47 AM

## 2017-10-24 ENCOUNTER — Other Ambulatory Visit: Payer: Self-pay | Admitting: Internal Medicine

## 2017-10-24 DIAGNOSIS — Z1231 Encounter for screening mammogram for malignant neoplasm of breast: Secondary | ICD-10-CM

## 2017-11-14 ENCOUNTER — Ambulatory Visit
Admission: RE | Admit: 2017-11-14 | Discharge: 2017-11-14 | Disposition: A | Discharge status: Home / Self Care | Payer: BC Managed Care – PPO | Source: Ambulatory Visit | Attending: Internal Medicine | Admitting: Internal Medicine

## 2017-11-14 DIAGNOSIS — Z1231 Encounter for screening mammogram for malignant neoplasm of breast: Secondary | ICD-10-CM

## 2017-12-26 ENCOUNTER — Emergency Department (HOSPITAL_COMMUNITY): Payer: BC Managed Care – PPO

## 2017-12-26 ENCOUNTER — Other Ambulatory Visit: Payer: Self-pay

## 2017-12-26 ENCOUNTER — Emergency Department (HOSPITAL_COMMUNITY)
Admission: EM | Admit: 2017-12-26 | Discharge: 2017-12-26 | Disposition: A | Discharge status: Home / Self Care | Payer: BC Managed Care – PPO | Attending: Emergency Medicine | Admitting: Emergency Medicine

## 2017-12-26 ENCOUNTER — Encounter (HOSPITAL_COMMUNITY): Payer: Self-pay | Admitting: Emergency Medicine

## 2017-12-26 DIAGNOSIS — K5732 Diverticulitis of large intestine without perforation or abscess without bleeding: Secondary | ICD-10-CM | POA: Diagnosis not present

## 2017-12-26 DIAGNOSIS — Z79899 Other long term (current) drug therapy: Secondary | ICD-10-CM | POA: Insufficient documentation

## 2017-12-26 DIAGNOSIS — R030 Elevated blood-pressure reading, without diagnosis of hypertension: Secondary | ICD-10-CM

## 2017-12-26 DIAGNOSIS — R1031 Right lower quadrant pain: Secondary | ICD-10-CM | POA: Diagnosis present

## 2017-12-26 LAB — CBC
HCT: 38.3 % (ref 36.0–46.0)
Hemoglobin: 11.8 g/dL — ABNORMAL LOW (ref 12.0–15.0)
MCH: 21.2 pg — ABNORMAL LOW (ref 26.0–34.0)
MCHC: 30.8 g/dL (ref 30.0–36.0)
MCV: 68.8 fL — ABNORMAL LOW (ref 78.0–100.0)
Platelets: 352 10*3/uL (ref 150–400)
RBC: 5.57 MIL/uL — ABNORMAL HIGH (ref 3.87–5.11)
RDW: 18.1 % — ABNORMAL HIGH (ref 11.5–15.5)
WBC: 8.9 10*3/uL (ref 4.0–10.5)

## 2017-12-26 LAB — LIPASE, BLOOD: Lipase: 37 U/L (ref 11–51)

## 2017-12-26 LAB — URINALYSIS, ROUTINE W REFLEX MICROSCOPIC
Bacteria, UA: NONE SEEN
Bilirubin Urine: NEGATIVE
Glucose, UA: NEGATIVE mg/dL
Ketones, ur: NEGATIVE mg/dL
Leukocytes, UA: NEGATIVE
Nitrite: NEGATIVE
Protein, ur: NEGATIVE mg/dL
Specific Gravity, Urine: 1.011 (ref 1.005–1.030)
pH: 6 (ref 5.0–8.0)

## 2017-12-26 LAB — COMPREHENSIVE METABOLIC PANEL
ALT: 12 U/L — ABNORMAL LOW (ref 14–54)
AST: 12 U/L — ABNORMAL LOW (ref 15–41)
Albumin: 3.7 g/dL (ref 3.5–5.0)
Alkaline Phosphatase: 61 U/L (ref 38–126)
Anion gap: 7 (ref 5–15)
BUN: 10 mg/dL (ref 6–20)
CO2: 23 mmol/L (ref 22–32)
Calcium: 8.9 mg/dL (ref 8.9–10.3)
Chloride: 108 mmol/L (ref 101–111)
Creatinine, Ser: 0.55 mg/dL (ref 0.44–1.00)
GFR calc Af Amer: >60 mL/min (ref >60)
GFR calc non Af Amer: >60 mL/min (ref >60)
Glucose, Bld: 118 mg/dL — ABNORMAL HIGH (ref 65–99)
Potassium: 3.6 mmol/L (ref 3.5–5.1)
Sodium: 138 mmol/L (ref 135–145)
Total Bilirubin: 0.4 mg/dL (ref 0.3–1.2)
Total Protein: 7 g/dL (ref 6.5–8.1)

## 2017-12-26 MED ORDER — METRONIDAZOLE 500 MG PO TABS: 500.0000 mg | ORAL_TABLET | Freq: Three times a day (TID) | ORAL | 1 refills | Status: DC

## 2017-12-26 MED ORDER — ONDANSETRON HCL 4 MG/2ML IJ SOLN
4.0000 mg | Freq: Once | INTRAMUSCULAR | Status: AC
  Administered 2017-12-26: 4 mg via INTRAVENOUS
  Filled 2017-12-26: qty 2

## 2017-12-26 MED ORDER — HYDROMORPHONE HCL 2 MG/ML IJ SOLN
0.5000 mg | Freq: Once | INTRAMUSCULAR | Status: AC
  Administered 2017-12-26: 0.5 mg via INTRAVENOUS
  Filled 2017-12-26: qty 1

## 2017-12-26 MED ORDER — CIPROFLOXACIN HCL 500 MG PO TABS: 500.0000 mg | ORAL_TABLET | Freq: Two times a day (BID) | ORAL | 1 refills | Status: DC

## 2017-12-26 MED ORDER — OXYCODONE-ACETAMINOPHEN 5-325 MG PO TABS: 1.0000 | ORAL_TABLET | ORAL | 0 refills | Status: DC | PRN

## 2017-12-26 MED ORDER — IOHEXOL 300 MG/ML  SOLN
100.0000 mL | Freq: Once | INTRAMUSCULAR | Status: AC | PRN
  Administered 2017-12-26: 100 mL via INTRAVENOUS

## 2017-12-26 MED ORDER — CIPROFLOXACIN HCL 500 MG PO TABS
500.0000 mg | ORAL_TABLET | Freq: Once | ORAL | Status: AC
  Administered 2017-12-26: 500 mg via ORAL
  Filled 2017-12-26: qty 1

## 2017-12-26 MED ORDER — SODIUM CHLORIDE 0.9 % IV BOLUS
1000.0000 mL | Freq: Once | INTRAVENOUS | Status: AC
  Administered 2017-12-26: 1000 mL via INTRAVENOUS

## 2017-12-26 MED ORDER — GI COCKTAIL ~~LOC~~
30.0000 mL | Freq: Once | ORAL | Status: AC
  Administered 2017-12-26: 30 mL via ORAL
  Filled 2017-12-26: qty 30

## 2017-12-26 MED ORDER — METRONIDAZOLE 500 MG PO TABS
500.0000 mg | ORAL_TABLET | Freq: Once | ORAL | Status: AC
Start: 2017-12-26 — End: 2017-12-26
  Administered 2017-12-26: 500 mg via ORAL
  Filled 2017-12-26: qty 1

## 2017-12-26 MED ORDER — OXYCODONE-ACETAMINOPHEN 5-325 MG PO TABS
2.0000 | ORAL_TABLET | Freq: Once | ORAL | Status: AC
Start: 2017-12-26 — End: 2017-12-26
  Administered 2017-12-26: 2 via ORAL
  Filled 2017-12-26: qty 2

## 2017-12-26 MED ORDER — ONDANSETRON HCL 4 MG PO TABS: 4.0000 mg | ORAL_TABLET | Freq: Three times a day (TID) | ORAL | 0 refills | Status: DC | PRN

## 2017-12-26 NOTE — ED Triage Notes (Signed)
Pt c/o mid to low abd pain onset yesterday with n/v. Pt denies diarrhea. Denies urinary s/s

## 2017-12-26 NOTE — ED Notes (Signed)
Care assumed at this time - resting quietly on stretcher

## 2017-12-26 NOTE — ED Notes (Signed)
Patient transported to CT 

## 2017-12-26 NOTE — ED Provider Notes (Signed)
[facility] Provider Note   CSN: [id number] Arrival date & time: 12/26/17  0541     History   Chief Complaint Chief Complaint  Patient presents with  . Abdominal Pain    HPI Katie Woodard is a 52 y.o. female who presents with onset of abdominal pain yesteday t 5:00PM. Described as constant, colicky stabbing pain. Located in the periumbilical, RLQ, and suprapubic region. She has associated constipation for the past 3 days with incomplete bowel emptying several days before. She denies diarrhea. She has had nausea, anorexia, and 2 episodes of NBNB vomitus overnight. Pain is radiated at 9/10 and does not radiate. She denies urinary or vaginal sxs.   Abdominal Pain   This is a recurrent problem. The current episode started 12 to 24 hours ago. The problem occurs constantly. The problem has been gradually worsening. The pain is located in the generalized abdominal region, RLQ and periumbilical region. The pain is at a severity of 9/10. The pain is severe. Associated symptoms include anorexia, flatus, nausea, vomiting and constipation. Pertinent negatives include fever, belching, diarrhea, hematochezia, melena, dysuria, frequency and hematuria. Associated symptoms comments: heartburn. Nothing aggravates the symptoms. Nothing relieves the symptoms. Past workup includes CT scan. Past medical history comments: diverticulitis, previous appendectomy.    Past Medical History:  Diagnosis Date  . DDD (degenerative disc disease), cervical   . Diverticulitis   . GERD (gastroesophageal reflux disease)    Takes OTC meds  . Headache   . Ovarian cyst   . Uterine fibroid     Patient Active Problem List   Diagnosis Date Noted  . Abdominal pain 01/31/2014  . Abdominal pain, chronic, right lower quadrant 01/31/2014  . OBESITY 11/11/2009  . ABSCESS, TOOTH 11/11/2009  . DIVERTICULITIS OF COLON 02/26/2009  . RECTAL BLEEDING 02/12/2009  . ABDOMINAL PAIN, CHRONIC  02/12/2009  . ANEMIA 11/13/2008  . MIGRAINE HEADACHE 11/13/2008  . HEMOCCULT POSITIVE STOOL 11/13/2008  . MICROSCOPIC HEMATURIA 11/13/2008  . VAGINITIS, BACTERIAL 11/13/2008  . GERD 10/18/2008    Past Surgical History:  Procedure Laterality Date  . CESAREAN SECTION  1988  . CHOLECYSTECTOMY  2005   [facility]  . LAPAROSCOPIC APPENDECTOMY N/A 01/31/2014   Procedure: APPENDECTOMY LAPAROSCOPIC;  Surgeon: Gwenyth Ober, MD;  Location: [facility] OR;  Service: General;  Laterality: N/A;     OB History    Gravida  4   Para  4   Term  4   Preterm      AB      Living        SAB      TAB      Ectopic      Multiple      Live Births               Home Medications    Prior to Admission medications   Medication Sig Start Date End Date Taking? Authorizing Provider  ciprofloxacin (CIPRO) 500 MG tablet Take 1 tablet (500 mg total) by mouth every 12 (twelve) hours. 03/30/17   Doristine Devoid, PA-C  HYDROcodone-acetaminophen (NORCO/VICODIN) 5-325 MG tablet Take 1-2 tablets by mouth every 6 (six) hours as needed for moderate pain or severe pain. 03/30/17   Ocie Cornfield T, PA-C  lidocaine (LIDODERM) 5 % Place 1 patch onto the skin daily. Remove & Discard patch within 12 hours or as directed by MD Patient not taking: Reported on 11/06/2016 04/02/16   Joy, Helane Gunther, PA-C  methocarbamol (ROBAXIN) 500  MG tablet Take 1 tablet (500 mg total) by mouth 2 (two) times daily. Patient not taking: Reported on 11/06/2016 04/02/16   Arlean Hopping C, PA-C  metroNIDAZOLE (FLAGYL) 500 MG tablet Take 1 tablet (500 mg total) by mouth 3 (three) times daily. DO NOT CONSUME ALCOHOL WHILE TAKING THIS MEDICATION. 03/30/17   Doristine Devoid, PA-C  Multiple Vitamin (MULTIVITAMIN WITH MINERALS) TABS tablet Take 1 tablet by mouth daily.    [provider]  naproxen (NAPROSYN) 500 MG tablet Take 1 tablet (500 mg total) by mouth 2 (two) times daily. Patient not taking: Reported on 11/06/2016 04/02/16   Joy, Shawn C, PA-C    ondansetron (ZOFRAN) 4 MG tablet Take 1 tablet (4 mg total) by mouth every 8 (eight) hours as needed for nausea. Patient not taking: Reported on 11/06/2016 02/01/14   Riebock, Estill Bakes, NP  ondansetron (ZOFRAN-ODT) 8 MG disintegrating tablet Take 1 tablet (8 mg total) by mouth every 8 (eight) hours as needed for nausea. 03/30/17   Doristine Devoid, PA-C    Family History Family History  Problem Relation Age of Onset  . Cancer Mother        breast  . Breast cancer Mother        93s and again in her 25s  . Cancer Sister        breast x2  . Breast cancer Sister        early 8s and again in late 48s  . Cancer Maternal Aunt        ovarian    Social History Social History   Tobacco Use  . Smoking status: Never Smoker  . Smokeless tobacco: Never Used  Substance Use Topics  . Alcohol use: No  . Drug use: No     Allergies   Patient has no known allergies.   Review of Systems Review of Systems  Constitutional: Negative for chills and fever.  HENT: Negative.   Eyes: Negative.   Respiratory: Negative.   Cardiovascular: Negative.   Gastrointestinal: Positive for abdominal pain, anorexia, constipation, flatus, nausea and vomiting. Negative for abdominal distention, blood in stool, diarrhea, hematochezia, melena and rectal pain.  Endocrine: Negative.   Genitourinary: Negative for dysuria, flank pain, frequency, hematuria, pelvic pain, vaginal bleeding, vaginal discharge and vaginal pain.  Musculoskeletal: Negative for back pain.  Skin: Negative.   Neurological: Negative.      Physical Exam Updated Vital Signs BP (!) 161/97 (BP Location: Left Arm)   Pulse 87   Temp 99 F (37.2 C) (Oral)   Resp 17   Ht 4\' 11"  (1.499 m)   Wt 90.7 kg (200 lb)   SpO2 99%   BMI 40.40 kg/m   Physical Exam  Constitutional: She is oriented to person, place, and time. She appears well-developed and well-nourished. No distress.  Appears uncomfortable, somewhat tearful, face is scrunched,  shallow rapid breathing, holding her umbilical region  HENT:  Head: Normocephalic and atraumatic.  Eyes: Conjunctivae are normal. No scleral icterus.  Neck: Normal range of motion.  Cardiovascular: Normal rate, regular rhythm and normal heart sounds. Exam reveals no gallop and no friction rub.  No murmur heard. Pulmonary/Chest: Breath sounds normal. No respiratory distress. She has no wheezes.  Abdominal: Soft. Bowel sounds are normal. She exhibits no distension and no mass. There is tenderness. There is no guarding.  Pustule with central crusting, 1 cm of circumferential erythema and mild tenderness  In the RLQ of the abdomen. Abdomen is quiet, soft and  TTP  in all regions. No guarding or rigidity. NO CVA  Neurological: She is alert and oriented to person, place, and time.  Skin: Skin is warm and dry. She is not diaphoretic.  Psychiatric: Her behavior is normal.  Nursing note and vitals reviewed.    ED Treatments / Results  Labs (all labs ordered are listed, but only abnormal results are displayed) Labs Reviewed  URINALYSIS, ROUTINE W REFLEX MICROSCOPIC - Abnormal; Notable for the following components:      Result Value   Color, Urine STRAW (*)    Hgb urine dipstick SMALL (*)    All other components within normal limits  LIPASE, BLOOD  COMPREHENSIVE METABOLIC PANEL  CBC    EKG None  Radiology No results found.  Procedures Procedures (including critical care time)  Medications Ordered in ED Medications  sodium chloride 0.9 % bolus 1,000 mL (has no administration in time range)  HYDROmorphone (DILAUDID) injection 0.5 mg (has no administration in time range)  ondansetron (ZOFRAN) injection 4 mg (has no administration in time range)  gi cocktail (Maalox,Lidocaine,Donnatal) (has no administration in time range)     Initial Impression / Assessment and Plan / ED Course  I have reviewed the triage vital signs and the nursing notes.  Pertinent labs & imaging results that  were available during my care of the patient were reviewed by me and considered in my medical decision making (see chart for details).     Patient with acute sigmoid diverticulitis.  I reviewed her imaging and labs.  She is tolerating p.o. fluids.  Patient will be discharged with Flagyl and Cipro.  I have given her her first dose here.  Follow-up with gastroenterology within the week.  I discussed return precautions with the patient.  She is discharged in good condition, hemodynamically stable.  It is noted that she has elevated blood pressure and needs to follow-up with her primary care regarding this reading.  Final Clinical Impressions(s) / ED Diagnoses   Final diagnoses:  None    ED Discharge Orders    None       Margarita Mail, PA-C 12/26/17 [place], MD 12/28/17 [phone]

## 2017-12-26 NOTE — Discharge Instructions (Signed)
You have been diagnosed with diverticulitis.  Please take the antibiotics until you have completed the course.  I have given you a refill in case you have recurrent symptoms you may fill the prescription and begin taking medications and follow-up with your GI specialist soon as possible in the future.  Please stay on a clear liquid diet for the next several days.  Return to the emergency department for the reasons we discussed.  Contact a health care provider if: Your pain does not improve. You have a hard time drinking or eating food. Your bowel movements do not return to normal. Get help right away if: Your pain gets worse. Your symptoms do not get better with treatment. Your symptoms suddenly get worse. You have a fever. You vomit more than one time. You have stools that are bloody, black, or tarry.

## 2017-12-26 NOTE — ED Notes (Signed)
Transported from CT scan via stretcher per rad tech  

## 2018-01-20 ENCOUNTER — Emergency Department (HOSPITAL_COMMUNITY): Payer: BC Managed Care – PPO

## 2018-01-20 ENCOUNTER — Emergency Department (HOSPITAL_COMMUNITY)
Admission: EM | Admit: 2018-01-20 | Discharge: 2018-01-20 | Disposition: A | Discharge status: Home / Self Care | Payer: BC Managed Care – PPO | Attending: Emergency Medicine | Admitting: Emergency Medicine

## 2018-01-20 ENCOUNTER — Other Ambulatory Visit: Payer: Self-pay

## 2018-01-20 ENCOUNTER — Encounter (HOSPITAL_COMMUNITY): Payer: Self-pay

## 2018-01-20 DIAGNOSIS — M25561 Pain in right knee: Secondary | ICD-10-CM

## 2018-01-20 DIAGNOSIS — M1731 Unilateral post-traumatic osteoarthritis, right knee: Secondary | ICD-10-CM | POA: Insufficient documentation

## 2018-01-20 MED ORDER — NAPROXEN 500 MG PO TABS: 500.0000 mg | ORAL_TABLET | Freq: Two times a day (BID) | ORAL | 0 refills | Status: DC

## 2018-01-20 MED ORDER — NAPROXEN 500 MG PO TABS
500.0000 mg | ORAL_TABLET | Freq: Once | ORAL | Status: AC
  Administered 2018-01-20: 500 mg via ORAL
  Filled 2018-01-20: qty 1

## 2018-01-20 NOTE — Discharge Instructions (Signed)
X-ray shows significant arthritis but no evidence of fracture dislocation.  Please use knee sleeve and crutches, ice and elevate the knee, please use naproxen twice daily and Tylenol as needed.  You can also use over-the-counter muscle rub such as Biofreeze.  If symptoms are not improving please follow-up with orthopedics.  Return for fevers, redness, worsening swelling or inability to flex and extend the knee.
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
## 2018-01-20 NOTE — ED Notes (Signed)
Discharge instructions reviewed with patient. Patient verbalizes understanding. VSS. Patient ambulatory out of ER with crutches and knee brace.

## 2018-02-06 ENCOUNTER — Ambulatory Visit (INDEPENDENT_AMBULATORY_CARE_PROVIDER_SITE_OTHER): Payer: Self-pay | Admitting: Orthopaedic Surgery

## 2018-02-13 ENCOUNTER — Encounter (INDEPENDENT_AMBULATORY_CARE_PROVIDER_SITE_OTHER): Payer: Self-pay | Admitting: Orthopaedic Surgery

## 2018-02-13 ENCOUNTER — Ambulatory Visit (INDEPENDENT_AMBULATORY_CARE_PROVIDER_SITE_OTHER): Payer: BC Managed Care – PPO | Admitting: Orthopaedic Surgery

## 2018-02-13 ENCOUNTER — Encounter (INDEPENDENT_AMBULATORY_CARE_PROVIDER_SITE_OTHER): Payer: Self-pay

## 2018-02-13 VITALS — BP 166/108 | HR 87 | Ht 59.0 in | Wt 205.0 lb

## 2018-02-13 DIAGNOSIS — M25561 Pain in right knee: Secondary | ICD-10-CM

## 2018-02-13 MED ORDER — METHYLPREDNISOLONE ACETATE 40 MG/ML IJ SUSP
80.0000 mg | INTRAMUSCULAR | Status: AC | PRN
  Administered 2018-02-13: 80 mg

## 2018-02-13 MED ORDER — LIDOCAINE HCL 1 % IJ SOLN
2.0000 mL | INTRAMUSCULAR | Status: AC | PRN
  Administered 2018-02-13: 2 mL

## 2018-02-13 MED ORDER — BUPIVACAINE HCL 0.5 % IJ SOLN
2.0000 mL | INTRAMUSCULAR | Status: AC | PRN
  Administered 2018-02-13: 2 mL via INTRA_ARTICULAR
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
## 2018-05-01 ENCOUNTER — Telehealth (INDEPENDENT_AMBULATORY_CARE_PROVIDER_SITE_OTHER): Payer: Self-pay | Admitting: Orthopaedic Surgery

## 2018-05-01 NOTE — Telephone Encounter (Signed)
Patient called stating she saw Dr. Durward Fortes in July and he told her to contact the office if she needed a brace for her right knee.  Patient requested a return call to let her know if it is something that she can pick up from his office or if she needs a prescription.

## 2018-05-01 NOTE — Telephone Encounter (Signed)
PLEASE ADVISE.

## 2018-05-02 NOTE — Telephone Encounter (Signed)
Will need to go to [facility]-please call and give her a prescription for an osteoarthritis brace

## 2018-05-02 NOTE — Telephone Encounter (Signed)
Filled out rx form and placed up front for pt to pick up. Notified pt

## 2018-12-16 ENCOUNTER — Other Ambulatory Visit: Payer: Self-pay | Admitting: Nurse Practitioner

## 2018-12-16 ENCOUNTER — Encounter (HOSPITAL_COMMUNITY): Payer: Self-pay | Admitting: Family Medicine

## 2018-12-16 ENCOUNTER — Other Ambulatory Visit: Payer: Self-pay

## 2018-12-16 ENCOUNTER — Ambulatory Visit (HOSPITAL_COMMUNITY)
Admission: EM | Admit: 2018-12-16 | Discharge: 2018-12-16 | Disposition: A | Discharge status: Home / Self Care | Payer: BC Managed Care – PPO | Attending: Family Medicine | Admitting: Family Medicine

## 2018-12-16 DIAGNOSIS — K5732 Diverticulitis of large intestine without perforation or abscess without bleeding: Secondary | ICD-10-CM

## 2018-12-16 MED ORDER — CEFTRIAXONE SODIUM 1 G IJ SOLR
1.0000 g | Freq: Once | INTRAMUSCULAR | Status: AC
  Administered 2018-12-16: 1 g via INTRAMUSCULAR

## 2018-12-16 MED ORDER — AMOXICILLIN-POT CLAVULANATE 875-125 MG PO TABS: 1.0000 | ORAL_TABLET | Freq: Two times a day (BID) | ORAL | 0 refills | Status: DC

## 2018-12-16 MED ORDER — CEFTRIAXONE SODIUM 1 G IJ SOLR
INTRAMUSCULAR | Status: AC
  Filled 2018-12-16: qty 10

## 2018-12-16 MED ORDER — ONDANSETRON HCL 4 MG PO TABS: 4.0000 mg | ORAL_TABLET | Freq: Three times a day (TID) | ORAL | 0 refills | Status: DC | PRN

## 2018-12-16 MED ORDER — LIDOCAINE HCL (PF) 1 % IJ SOLN
INTRAMUSCULAR | Status: AC
  Filled 2018-12-16: qty 2

## 2018-12-16 MED ORDER — ONDANSETRON 4 MG PO TBDP
ORAL_TABLET | ORAL | Status: AC
  Filled 2018-12-16: qty 1

## 2018-12-16 MED ORDER — ONDANSETRON 4 MG PO TBDP
4.0000 mg | ORAL_TABLET | Freq: Once | ORAL | Status: AC
  Administered 2018-12-16: 4 mg via ORAL

## 2018-12-16 NOTE — ED Provider Notes (Signed)
[name]    CSN: [id number] Arrival date & time: 12/16/18  1000     History   Chief Complaint Chief Complaint  Patient presents with  . Abdominal Pain    HPI Katie Woodard is a 53 y.o. female.   53 yo woman with h/o diverticulitis, developed nausea and lower abdominal pain like she's had before with diverticulitis, two days ago.  She vomited last night.  Last BM yesterday.  Voiding small amounts without dysuria or flank pain.  No fever.     Past Medical History:  Diagnosis Date  . DDD (degenerative disc disease), cervical   . Diverticulitis   . GERD (gastroesophageal reflux disease)    Takes OTC meds  . Headache   . Ovarian cyst   . Uterine fibroid     Patient Active Problem List   Diagnosis Date Noted  . Abdominal pain 01/31/2014  . Abdominal pain, chronic, right lower quadrant 01/31/2014  . OBESITY 11/11/2009  . ABSCESS, TOOTH 11/11/2009  . DIVERTICULITIS OF COLON 02/26/2009  . RECTAL BLEEDING 02/12/2009  . ABDOMINAL PAIN, CHRONIC 02/12/2009  . ANEMIA 11/13/2008  . MIGRAINE HEADACHE 11/13/2008  . HEMOCCULT POSITIVE STOOL 11/13/2008  . MICROSCOPIC HEMATURIA 11/13/2008  . VAGINITIS, BACTERIAL 11/13/2008  . GERD 10/18/2008    Past Surgical History:  Procedure Laterality Date  . CESAREAN SECTION  1988  . CHOLECYSTECTOMY  2005   [facility]  . LAPAROSCOPIC APPENDECTOMY N/A 01/31/2014   Procedure: APPENDECTOMY LAPAROSCOPIC;  Surgeon: Gwenyth Ober, MD;  Location: [facility];  Service: General;  Laterality: N/A;    OB History    Gravida  4   Para  4   Term  4   Preterm      AB      Living        SAB      TAB      Ectopic      Multiple      Live Births               Home Medications    Prior to Admission medications   Medication Sig Start Date End Date Taking? Authorizing Provider  amoxicillin-clavulanate (AUGMENTIN) 875-125 MG tablet Take 1 tablet by mouth every 12 (twelve) hours. 12/16/18   Robyn Haber, MD  LINZESS 72  MCG capsule Take 72 mcg by mouth daily. 10/24/17   [provider]  Multiple Vitamins-Minerals (ONE-A-DAY WOMENS 50+ ADVANTAGE PO) Take 1 tablet by mouth daily.    [provider]  naproxen (NAPROSYN) 500 MG tablet Take 1 tablet (500 mg total) by mouth 2 (two) times daily. 01/20/18   Jacqlyn Larsen, PA-C  ondansetron (ZOFRAN) 4 MG tablet Take 1 tablet (4 mg total) by mouth every 8 (eight) hours as needed for nausea or vomiting. 12/16/18   Robyn Haber, MD    Family History Family History  Problem Relation Age of Onset  . Cancer Mother        breast  . Breast cancer Mother        65s and again in her 28s  . Cancer Sister        breast x2  . Breast cancer Sister        early 28s and again in late 85s  . Cancer Maternal Aunt        ovarian    Social History Social History   Tobacco Use  . Smoking status: Never Smoker  . Smokeless tobacco: Never Used  Substance  Use Topics  . Alcohol use: No  . Drug use: No     Allergies   Patient has no known allergies.   Review of Systems Review of Systems  Constitutional: Positive for fever.  Gastrointestinal: Positive for abdominal pain and nausea.  All other systems reviewed and are negative.    Physical Exam Triage Vital Signs ED Triage Vitals  Enc Vitals Group     BP      Pulse      Resp      Temp      Temp src      SpO2      Weight      Height      Head Circumference      Peak Flow      Pain Score      Pain Loc      Pain Edu?      Excl. in [place]?    No data found.  Updated Vital Signs BP (!) 160/110   Pulse 100   Temp 100 F (37.8 C)   Resp 18   Wt 99.8 kg   SpO2 97%   BMI 44.43 kg/m    Physical Exam Vitals signs and nursing note reviewed.  Constitutional:      Appearance: She is well-developed. She is obese.  Cardiovascular:     Rate and Rhythm: Tachycardia present.     Heart sounds: Normal heart sounds.  Pulmonary:     Effort: Pulmonary effort is normal.     Breath sounds:  Normal breath sounds.  Abdominal:     General: Abdomen is flat.     Palpations: Abdomen is soft.     Tenderness: There is abdominal tenderness in the suprapubic area and left lower quadrant. There is guarding. There is no rebound.  Skin:    General: Skin is warm.  Neurological:     Mental Status: She is alert.  Psychiatric:        Mood and Affect: Mood normal.        Behavior: Behavior normal.      UC Treatments / Results  Labs (all labs ordered are listed, but only abnormal results are displayed) Labs Reviewed - No data to display  EKG None  Radiology No results found.  Procedures Procedures (including critical care time)  Medications Ordered in UC Medications  cefTRIAXone (ROCEPHIN) injection 1 g (has no administration in time range)  ondansetron (ZOFRAN-ODT) disintegrating tablet 4 mg (has no administration in time range)    Initial Impression / Assessment and Plan / UC Course  I have reviewed the triage vital signs and the nursing notes.  Pertinent labs & imaging results that were available during my care of the patient were reviewed by me and considered in my medical decision making (see chart for details).    Final Clinical Impressions(s) / UC Diagnoses   Final diagnoses:  Diverticulitis of colon     Discharge Instructions     Please return in two days for recheck.  If you worsen, you need to go to the emergency department.    ED Prescriptions    Medication Sig Dispense Auth. Provider   ondansetron (ZOFRAN) 4 MG tablet Take 1 tablet (4 mg total) by mouth every 8 (eight) hours as needed for nausea or vomiting. 10 tablet Robyn Haber, MD   amoxicillin-clavulanate (AUGMENTIN) 875-125 MG tablet Take 1 tablet by mouth every 12 (twelve) hours. 20 tablet Robyn Haber, MD     Controlled Substance Prescriptions [place]  Controlled Substance Registry consulted? Not Applicable   Robyn Haber, MD 12/16/18 1027

## 2018-12-16 NOTE — Discharge Instructions (Addendum)
Please return in two days for recheck.  If you worsen, you need to go to the emergency department.

## 2018-12-16 NOTE — ED Triage Notes (Signed)
Pt states she is having a diverticulitis  flare up x 2 days. Pt cc acid reflux, vomiting, abdominal tenderness and some bloating.

## 2018-12-20 ENCOUNTER — Telehealth: Payer: Self-pay

## 2018-12-20 NOTE — Telephone Encounter (Signed)
Called pt because we received a refill request for linzess but we havent seen pt in a year so I wanted to see if she was still coming here. Unable to leave v/m due to mailbox being full [facility]

## 2018-12-22 ENCOUNTER — Telehealth (HOSPITAL_COMMUNITY): Payer: Self-pay | Admitting: Emergency Medicine

## 2018-12-22 NOTE — Telephone Encounter (Signed)
Pt called asking for refill of naproxen, pt given information that PCP is trying to reach her for refill of her medicines. Pt will try to contact PCP.

## 2018-12-26 ENCOUNTER — Other Ambulatory Visit: Payer: Self-pay | Admitting: Nurse Practitioner

## 2019-01-23 ENCOUNTER — Inpatient Hospital Stay (HOSPITAL_COMMUNITY)
Admission: EM | Admit: 2019-01-23 | Discharge: 2019-01-25 | DRG: 392 | Disposition: A | Discharge status: Home / Self Care | Payer: BC Managed Care – PPO | Attending: Internal Medicine | Admitting: Internal Medicine

## 2019-01-23 ENCOUNTER — Other Ambulatory Visit: Payer: Self-pay | Admitting: Gastroenterology

## 2019-01-23 ENCOUNTER — Encounter (HOSPITAL_COMMUNITY): Payer: Self-pay | Admitting: Emergency Medicine

## 2019-01-23 ENCOUNTER — Ambulatory Visit
Admission: RE | Admit: 2019-01-23 | Discharge: 2019-01-23 | Disposition: A | Discharge status: Home / Self Care | Payer: BC Managed Care – PPO | Source: Ambulatory Visit | Attending: Gastroenterology | Admitting: Gastroenterology

## 2019-01-23 ENCOUNTER — Other Ambulatory Visit: Payer: Self-pay

## 2019-01-23 DIAGNOSIS — Z885 Allergy status to narcotic agent status: Secondary | ICD-10-CM | POA: Diagnosis not present

## 2019-01-23 DIAGNOSIS — Z803 Family history of malignant neoplasm of breast: Secondary | ICD-10-CM

## 2019-01-23 DIAGNOSIS — K219 Gastro-esophageal reflux disease without esophagitis: Secondary | ICD-10-CM | POA: Diagnosis present

## 2019-01-23 DIAGNOSIS — Z79899 Other long term (current) drug therapy: Secondary | ICD-10-CM | POA: Diagnosis not present

## 2019-01-23 DIAGNOSIS — Z9049 Acquired absence of other specified parts of digestive tract: Secondary | ICD-10-CM

## 2019-01-23 DIAGNOSIS — K5732 Diverticulitis of large intestine without perforation or abscess without bleeding: Principal | ICD-10-CM | POA: Diagnosis present

## 2019-01-23 DIAGNOSIS — Z1159 Encounter for screening for other viral diseases: Secondary | ICD-10-CM | POA: Diagnosis not present

## 2019-01-23 DIAGNOSIS — R109 Unspecified abdominal pain: Secondary | ICD-10-CM

## 2019-01-23 DIAGNOSIS — K5792 Diverticulitis of intestine, part unspecified, without perforation or abscess without bleeding: Secondary | ICD-10-CM | POA: Diagnosis not present

## 2019-01-23 DIAGNOSIS — M5030 Other cervical disc degeneration, unspecified cervical region: Secondary | ICD-10-CM | POA: Diagnosis present

## 2019-01-23 DIAGNOSIS — D259 Leiomyoma of uterus, unspecified: Secondary | ICD-10-CM | POA: Diagnosis present

## 2019-01-23 DIAGNOSIS — D509 Iron deficiency anemia, unspecified: Secondary | ICD-10-CM | POA: Diagnosis present

## 2019-01-23 DIAGNOSIS — Z6841 Body Mass Index (BMI) 40.0 and over, adult: Secondary | ICD-10-CM

## 2019-01-23 DIAGNOSIS — E876 Hypokalemia: Secondary | ICD-10-CM | POA: Diagnosis present

## 2019-01-23 LAB — URINALYSIS, ROUTINE W REFLEX MICROSCOPIC
Bilirubin Urine: NEGATIVE
Glucose, UA: NEGATIVE mg/dL
Ketones, ur: NEGATIVE mg/dL
Leukocytes,Ua: NEGATIVE
Nitrite: NEGATIVE
Protein, ur: NEGATIVE mg/dL
Specific Gravity, Urine: >1.046 — ABNORMAL HIGH (ref 1.005–1.030)
pH: 6 (ref 5.0–8.0)

## 2019-01-23 LAB — COMPREHENSIVE METABOLIC PANEL
ALT: 14 U/L (ref 0–44)
AST: 13 U/L — ABNORMAL LOW (ref 15–41)
Albumin: 4.1 g/dL (ref 3.5–5.0)
Alkaline Phosphatase: 68 U/L (ref 38–126)
Anion gap: 10 (ref 5–15)
BUN: 8 mg/dL (ref 6–20)
CO2: 24 mmol/L (ref 22–32)
Calcium: 9.3 mg/dL (ref 8.9–10.3)
Chloride: 104 mmol/L (ref 98–111)
Creatinine, Ser: 0.57 mg/dL (ref 0.44–1.00)
GFR calc Af Amer: >60 mL/min (ref >60)
GFR calc non Af Amer: >60 mL/min (ref >60)
Glucose, Bld: 102 mg/dL — ABNORMAL HIGH (ref 70–99)
Potassium: 3.3 mmol/L — ABNORMAL LOW (ref 3.5–5.1)
Sodium: 138 mmol/L (ref 135–145)
Total Bilirubin: 0.5 mg/dL (ref 0.3–1.2)
Total Protein: 7.9 g/dL (ref 6.5–8.1)

## 2019-01-23 LAB — CBC
HCT: 40.9 % (ref 36.0–46.0)
Hemoglobin: 12.1 g/dL (ref 12.0–15.0)
MCH: 20.3 pg — ABNORMAL LOW (ref 26.0–34.0)
MCHC: 29.6 g/dL — ABNORMAL LOW (ref 30.0–36.0)
MCV: 68.6 fL — ABNORMAL LOW (ref 80.0–100.0)
Platelets: 352 10*3/uL (ref 150–400)
RBC: 5.96 MIL/uL — ABNORMAL HIGH (ref 3.87–5.11)
RDW: 19.1 % — ABNORMAL HIGH (ref 11.5–15.5)
WBC: 10.5 10*3/uL (ref 4.0–10.5)
nRBC: 0 % (ref 0.0–0.2)

## 2019-01-23 LAB — LIPASE, BLOOD: Lipase: 28 U/L (ref 11–51)

## 2019-01-23 LAB — SARS CORONAVIRUS 2 BY RT PCR (HOSPITAL ORDER, PERFORMED IN ~~LOC~~ HOSPITAL LAB): SARS Coronavirus 2: NEGATIVE

## 2019-01-23 LAB — MAGNESIUM: Magnesium: 2.1 mg/dL (ref 1.7–2.4)

## 2019-01-23 MED ORDER — POTASSIUM CHLORIDE IN NACL 40-0.9 MEQ/L-% IV SOLN
INTRAVENOUS | Status: DC
  Administered 2019-01-23 – 2019-01-24 (×3): 100 mL/h via INTRAVENOUS
  Filled 2019-01-23 (×5): qty 1000

## 2019-01-23 MED ORDER — SODIUM CHLORIDE 0.9 % IV BOLUS
1000.0000 mL | Freq: Once | INTRAVENOUS | Status: AC
  Administered 2019-01-23: 1000 mL via INTRAVENOUS

## 2019-01-23 MED ORDER — CIPROFLOXACIN IN D5W 400 MG/200ML IV SOLN
400.0000 mg | Freq: Two times a day (BID) | INTRAVENOUS | Status: DC
  Administered 2019-01-24 – 2019-01-25 (×3): 400 mg via INTRAVENOUS
  Filled 2019-01-23 (×3): qty 200

## 2019-01-23 MED ORDER — ONDANSETRON HCL 4 MG/2ML IJ SOLN
4.0000 mg | Freq: Four times a day (QID) | INTRAMUSCULAR | Status: DC | PRN
  Administered 2019-01-24 (×2): 4 mg via INTRAVENOUS
  Filled 2019-01-23 (×3): qty 2

## 2019-01-23 MED ORDER — ACETAMINOPHEN 325 MG PO TABS
650.0000 mg | ORAL_TABLET | Freq: Four times a day (QID) | ORAL | Status: DC | PRN
  Administered 2019-01-24: 650 mg via ORAL
  Filled 2019-01-23: qty 2

## 2019-01-23 MED ORDER — PANTOPRAZOLE SODIUM 40 MG IV SOLR
40.0000 mg | Freq: Once | INTRAVENOUS | Status: AC
  Administered 2019-01-23: 40 mg via INTRAVENOUS
  Filled 2019-01-23: qty 40

## 2019-01-23 MED ORDER — MAGNESIUM SULFATE 2 GM/50ML IV SOLN
2.0000 g | Freq: Once | INTRAVENOUS | Status: AC
  Administered 2019-01-23: 2 g via INTRAVENOUS
  Filled 2019-01-23: qty 50

## 2019-01-23 MED ORDER — ONDANSETRON HCL 4 MG/2ML IJ SOLN
4.0000 mg | Freq: Once | INTRAMUSCULAR | Status: AC
  Administered 2019-01-23: 4 mg via INTRAVENOUS
  Filled 2019-01-23: qty 2

## 2019-01-23 MED ORDER — PROCHLORPERAZINE EDISYLATE 10 MG/2ML IJ SOLN
5.0000 mg | INTRAMUSCULAR | Status: DC | PRN
  Administered 2019-01-23: 5 mg via INTRAVENOUS
  Filled 2019-01-23: qty 2

## 2019-01-23 MED ORDER — ONDANSETRON HCL 4 MG PO TABS: 4.0000 mg | ORAL_TABLET | Freq: Four times a day (QID) | ORAL | Status: DC | PRN

## 2019-01-23 MED ORDER — METRONIDAZOLE IN NACL 5-0.79 MG/ML-% IV SOLN
500.0000 mg | Freq: Once | INTRAVENOUS | Status: AC
  Administered 2019-01-23: 500 mg via INTRAVENOUS
  Filled 2019-01-23: qty 100

## 2019-01-23 MED ORDER — BOOST / RESOURCE BREEZE PO LIQD CUSTOM
1.0000 | Freq: Three times a day (TID) | ORAL | Status: DC
  Administered 2019-01-24 – 2019-01-25 (×2): 1 via ORAL

## 2019-01-23 MED ORDER — IOPAMIDOL (ISOVUE-300) INJECTION 61%
100.0000 mL | Freq: Once | INTRAVENOUS | Status: AC | PRN
  Administered 2019-01-23: 100 mL via INTRAVENOUS

## 2019-01-23 MED ORDER — METRONIDAZOLE IN NACL 5-0.79 MG/ML-% IV SOLN
500.0000 mg | Freq: Three times a day (TID) | INTRAVENOUS | Status: DC
  Administered 2019-01-24 – 2019-01-25 (×5): 500 mg via INTRAVENOUS
  Filled 2019-01-23 (×5): qty 100

## 2019-01-23 MED ORDER — KETOROLAC TROMETHAMINE 30 MG/ML IJ SOLN
30.0000 mg | Freq: Once | INTRAMUSCULAR | Status: AC
  Administered 2019-01-23: 30 mg via INTRAVENOUS
  Filled 2019-01-23: qty 1

## 2019-01-23 MED ORDER — FAMOTIDINE IN NACL 20-0.9 MG/50ML-% IV SOLN
20.0000 mg | Freq: Two times a day (BID) | INTRAVENOUS | Status: DC
  Administered 2019-01-23 – 2019-01-25 (×4): 20 mg via INTRAVENOUS
  Filled 2019-01-23 (×6): qty 50

## 2019-01-23 MED ORDER — MORPHINE SULFATE (PF) 4 MG/ML IV SOLN
4.0000 mg | Freq: Once | INTRAVENOUS | Status: AC
  Administered 2019-01-23: 4 mg via INTRAVENOUS
  Filled 2019-01-23: qty 1

## 2019-01-23 MED ORDER — CIPROFLOXACIN IN D5W 400 MG/200ML IV SOLN
400.0000 mg | Freq: Once | INTRAVENOUS | Status: AC
  Administered 2019-01-23: 400 mg via INTRAVENOUS
  Filled 2019-01-23: qty 200

## 2019-01-23 MED ORDER — ACETAMINOPHEN 650 MG RE SUPP: 650.0000 mg | Freq: Four times a day (QID) | RECTAL | Status: DC | PRN

## 2019-01-23 MED ORDER — HYDROMORPHONE HCL 1 MG/ML IJ SOLN
1.0000 mg | INTRAMUSCULAR | Status: DC | PRN
  Administered 2019-01-23: 1 mg via INTRAVENOUS
  Filled 2019-01-23: qty 1

## 2019-01-23 NOTE — Progress Notes (Signed)
Katie Woodard is a 53 y.o. female patient admitted from ED awake, alert - oriented  X 4 - no acute distress noted.  VSS - Blood pressure (!) 156/90, pulse 72, temperature 98.2 F (36.8 C), temperature source Oral, resp. rate 18, SpO2 100 %.    IV in place, occlusive dsg intact without redness.  Orientation to room, and floor completed with information packet given to patient/family.  Patient declined safety video at this time.  Admission INP armband ID verified with patient/family, and in place.   SR up x 2, fall assessment complete, with patient and family able to verbalize understanding of risk associated with falls, and verbalized understanding to call nsg before up out of bed.  Call light within reach, patient able to voice, and demonstrate understanding.  Skin, clean-dry- intact without evidence of bruising, or skin tears.   No evidence of skin break down noted on exam.     Will cont to eval and treat per MD orders.  Luci Bank, RN 01/23/2019 5:56 PM

## 2019-01-23 NOTE — ED Notes (Signed)
ED TO INPATIENT HANDOFF REPORT  ED Nurse Name and Phone #: [phone]  S Name/Age/Gender Read Drivers 53 y.o. female Room/Bed: 006C/006C  Code Status   Code Status: Full Code  Home/SNF/Other Home Patient oriented to: self, place, time and situation Is this baseline? Yes   Triage Complete: Triage complete  Chief Complaint abd pain  Triage Note Pt reports she had a CT abd today for ongoing abd pain for the past few weeks, states that shortly after leaving her CT scan,her doctors office called stating her ct showed an infection in her abd and told her to come to the ED meet dr. Benson Norway. Pt endorses vomiting.    Allergies No Known Allergies  Level of Care/Admitting Diagnosis ED Disposition    ED Disposition Condition [facility]: [place] [[id number]]  Level of Care: Med-Surg [16]  Covid Evaluation: Screening Protocol (No Symptoms)  Diagnosis: Diverticulitis of sigmoid colon [417408]  Admitting Physician: Reubin Milan [[id number]]  Attending Physician: Reubin Milan [[id number]]  Estimated length of stay: past midnight tomorrow  Certification:: I certify this patient will need inpatient services for at least 2 midnights  PT Class (Do Not Modify): Inpatient [101]  PT Acc Code (Do Not Modify): Private [1]       B Medical/Surgery History Past Medical History:  Diagnosis Date  . DDD (degenerative disc disease), cervical   . Diverticulitis   . GERD (gastroesophageal reflux disease)    Takes OTC meds  . Headache   . Ovarian cyst   . Uterine fibroid    Past Surgical History:  Procedure Laterality Date  . CESAREAN SECTION  1988  . CHOLECYSTECTOMY  2005   [facility]  . LAPAROSCOPIC APPENDECTOMY N/A 01/31/2014   Procedure: APPENDECTOMY LAPAROSCOPIC;  Surgeon: Gwenyth Ober, MD;  Location: [place];  Service: General;  Laterality: N/A;     A IV Location/Drains/Wounds Patient Lines/Drains/Airways Status   Active Line/Drains/Airways    Name:   Placement date:   Placement time:   Site:   Days:   Peripheral IV 01/23/19 Right Hand   01/23/19    1512    Hand   less than 1   Incision (Closed) 01/31/14 Abdomen Other (Comment)   01/31/14    1859     1818          Intake/Output Last 24 hours No intake or output data in the 24 hours ending 01/23/19 1647  Labs/Imaging Results for orders placed or performed during the hospital encounter of 01/23/19 (from the past 48 hour(s))  Lipase, blood     Status: None   Collection Time: 01/23/19  1:56 PM  Result Value Ref Range   Lipase 28 11 - 51 U/L    Comment: Performed at [facility], [address] [place], [place] [address]  Comprehensive metabolic panel     Status: Abnormal   Collection Time: 01/23/19  1:56 PM  Result Value Ref Range   Sodium 138 135 - 145 mmol/L   Potassium 3.3 (L) 3.5 - 5.1 mmol/L   Chloride 104 98 - 111 mmol/L   CO2 24 22 - 32 mmol/L   Glucose, Bld 102 (H) 70 - 99 mg/dL   BUN 8 6 - 20 mg/dL   Creatinine, Ser 0.57 0.44 - 1.00 mg/dL   Calcium 9.3 8.9 - 10.3 mg/dL   Total Protein 7.9 6.5 - 8.1 g/dL   Albumin 4.1 3.5 - 5.0 g/dL   AST 13 (  L) 15 - 41 U/L   ALT 14 0 - 44 U/L   Alkaline Phosphatase 68 38 - 126 U/L   Total Bilirubin 0.5 0.3 - 1.2 mg/dL   GFR calc non Af Amer >60 >60 mL/min   GFR calc Af Amer >60 >60 mL/min   Anion gap 10 5 - 15    Comment: Performed at [facility] [address], [place], [place] [address]  CBC     Status: Abnormal   Collection Time: 01/23/19  1:56 PM  Result Value Ref Range   WBC 10.5 4.0 - 10.5 K/uL   RBC 5.96 (H) 3.87 - 5.11 MIL/uL   Hemoglobin 12.1 12.0 - 15.0 g/dL   HCT 40.9 36.0 - 46.0 %   MCV 68.6 (L) 80.0 - 100.0 fL   MCH 20.3 (L) 26.0 - 34.0 pg   MCHC 29.6 (L) 30.0 - 36.0 g/dL   RDW 19.1 (H) 11.5 - 15.5 %   Platelets 352 150 - 400 K/uL    Comment: REPEATED TO VERIFY   nRBC 0.0 0.0 - 0.2 %    Comment: Performed at [facility], [place] [address], [place], [place] [address]  Urinalysis,  Routine w reflex microscopic     Status: Abnormal   Collection Time: 01/23/19  2:49 PM  Result Value Ref Range   Color, Urine YELLOW YELLOW   APPearance CLEAR CLEAR   Specific Gravity, Urine >1.046 (H) 1.005 - 1.030   pH 6.0 5.0 - 8.0   Glucose, UA NEGATIVE NEGATIVE mg/dL   Hgb urine dipstick SMALL (A) NEGATIVE   Bilirubin Urine NEGATIVE NEGATIVE   Ketones, ur NEGATIVE NEGATIVE mg/dL   Protein, ur NEGATIVE NEGATIVE mg/dL   Nitrite NEGATIVE NEGATIVE   Leukocytes,Ua NEGATIVE NEGATIVE   RBC / HPF 0-5 0 - 5 RBC/hpf   WBC, UA 0-5 0 - 5 WBC/hpf   Bacteria, UA RARE (A) NONE SEEN   Squamous Epithelial / LPF 6-10 0 - 5    Comment: Performed at [facility], [place] [address], [place], [place] [address]   Ct Abdomen Pelvis W Contrast  Result Date: 01/23/2019 CLINICAL DATA:  Generalized abdominal pain since 12/25/2018. Occasional vomiting. EXAM: CT ABDOMEN AND PELVIS WITH CONTRAST TECHNIQUE: Multidetector CT imaging of the abdomen and pelvis was performed using the standard protocol following bolus administration of intravenous contrast. CONTRAST:  180mL ISOVUE-300 IOPAMIDOL (ISOVUE-300) INJECTION 61% COMPARISON:  12/26/2017 FINDINGS: Lower chest: The lung bases are clear of acute process. No pleural effusion or pulmonary lesions. The heart is normal in size. No pericardial effusion. The distal esophagus and aorta are unremarkable. Hepatobiliary: No focal hepatic lesions or intrahepatic biliary dilatation. The gallbladder is surgically absent. No common bile duct dilatation. Pancreas: No mass, inflammation or ductal dilatation. Spleen: Normal size.  No focal lesions. Adrenals/Urinary Tract: The adrenal glands and kidneys are unremarkable and stable. No renal, ureteral or bladder calculi or mass. The bladder appears normal. Stomach/Bowel: The stomach, duodenum, small bowel and terminal ileum are normal. No acute inflammatory changes, mass lesions or obstructive findings. The appendix is surgically  absent. There are changes of acute diverticulitis involving the sigmoid colon proximally. There is wall thickening, submucosal edema and pericolonic interstitial/inflammatory change. There is also a small, 18 x 15 mm intramural abscess best seen on coronal image 63. No findings for perforation. Vascular/Lymphatic: The aorta is normal in caliber. No dissection. The branch vessels are patent. The major venous structures are patent. No mesenteric or retroperitoneal mass  or adenopathy. Small scattered lymph nodes are noted. Reproductive: The uterus and ovaries are unremarkable. Submucosal fibroid versus endometrial polyp. Recommend correlation with pelvic ultrasound examination. The ovaries appear normal. Prominent left-sided parametrial vessels suggesting pelvic congestion syndrome. Other: No pelvic mass or adenopathy. No free pelvic fluid collections. No inguinal mass or adenopathy. No abdominal wall hernia or subcutaneous lesions. Musculoskeletal: No significant bony findings. IMPRESSION: 1. Upper sigmoid colon diverticulitis with a small, 18 x 15 mm intramural abscess. No perforation or pelvic abscess. 2. No other acute abdominal/pelvic findings. 3. 14 mm endometrial polyp versus submucosal fibroid in the uterus. Recommend pelvic ultrasound examination for further evaluation. 4. 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 5. Status post cholecystectomy.  No biliary dilatation. Electronically Signed   By: Marijo Sanes M.D.   On: 01/23/2019 13:05    Pending Labs Unresulted Labs (From admission, onward)    Start     Ordered   01/24/19 0500  HIV antibody (Routine Testing)  Tomorrow morning,   R     01/23/19 1618   01/24/19 0500  CBC WITH DIFFERENTIAL  Daily,   R     01/23/19 1618   01/24/19 5038  Basic metabolic panel  Tomorrow morning,   R     01/23/19 1618   01/24/19 0500  Vitamin B12  (Anemia Panel (PNL))  Tomorrow morning,   R     01/23/19 1625   01/24/19 0500  Folate   (Anemia Panel (PNL))  Tomorrow morning,   R     01/23/19 1625   01/24/19 0500  Iron and TIBC  (Anemia Panel (PNL))  Tomorrow morning,   R     01/23/19 1625   01/24/19 0500  Ferritin  (Anemia Panel (PNL))  Tomorrow morning,   R     01/23/19 1625   01/24/19 0500  Reticulocytes  (Anemia Panel (PNL))  Tomorrow morning,   R     01/23/19 1625   01/23/19 1539  Magnesium  Add-on,   AD     01/23/19 1538   01/23/19 1513  SARS Coronavirus 2 [facility] order, Performed in [place] hospital lab)  Once,   R     01/23/19 1513   01/23/19 1501  SARS Coronavirus 2 (CEPHEID - Performed in [facility] hospital lab), Hosp Order  (Asymptomatic Patients Labs)  Once,   STAT    Question:  Rule Out  Answer:  Yes   01/23/19 1500          Vitals/Pain Today's Vitals   01/23/19 1520 01/23/19 1530 01/23/19 1600 01/23/19 1630  BP:  (!) 151/78 (!) 151/78 135/80  Pulse: 82 74 77 72  Resp:      Temp:      TempSrc:      SpO2: 100% 100% 99% 98%  PainSc:        Isolation Precautions No active isolations  Medications Medications  ciprofloxacin (CIPRO) IVPB 400 mg (0 mg Intravenous Stopped 01/23/19 1629)    And  metroNIDAZOLE (FLAGYL) IVPB 500 mg (500 mg Intravenous New Bag/Given 01/23/19 1644)  0.9 % NaCl with KCl 40 mEq / L  infusion (has no administration in time range)  magnesium sulfate IVPB 2 g 50 mL (has no administration in time range)  HYDROmorphone (DILAUDID) injection 1 mg (has no administration in time range)  metroNIDAZOLE (FLAGYL) IVPB 500 mg (has no administration in time range)  ciprofloxacin (CIPRO) IVPB 400 mg (has no administration in time range)  prochlorperazine (COMPAZINE) injection 5 mg (  has no administration in time range)  ketorolac (TORADOL) 30 MG/ML injection 30 mg (has no administration in time range)  famotidine (PEPCID) IVPB 20 mg premix (has no administration in time range)  pantoprazole (PROTONIX) injection 40 mg (has no administration in time range)  acetaminophen (TYLENOL)  tablet 650 mg (has no administration in time range)    Or  acetaminophen (TYLENOL) suppository 650 mg (has no administration in time range)  ondansetron (ZOFRAN) tablet 4 mg (has no administration in time range)    Or  ondansetron (ZOFRAN) injection 4 mg (has no administration in time range)  sodium chloride 0.9 % bolus 1,000 mL (1,000 mLs Intravenous New Bag/Given 01/23/19 1519)  morphine 4 MG/ML injection 4 mg (4 mg Intravenous Given 01/23/19 1522)  ondansetron (ZOFRAN) injection 4 mg (4 mg Intravenous Given 01/23/19 1522)    Mobility walks Low fall risk   Focused Assessments Abdominal Pain    R Recommendations: See Admitting Provider Note  Report given to:   Additional Notes:

## 2019-01-23 NOTE — H&P (Signed)
History and Physical    Katie Woodard TJQ:[id number] DOB: [birth year] DOA: 01/23/2019  PCP: Minette Brine, FNP   Patient coming from: Home.  I have personally briefly reviewed patient's old medical records in [place]  Chief Complaint: Abdominal pain.  HPI: Katie Woodard is a 53 y.o. female with medical history significant of cervical DDD, history of diverticulosis, history of diverticulitis, GERD, occasional headaches, ovarian cyst, uterine fibroid who is coming to the emergency department after seeing Dr. Benson Norway from GI due to progressively worse abdominal pain since the end of May.  She says that she initially went to the urgent care and was given 2 weeks of Augmentin, which improved the pain.  However, after finishing the antibiotic course, her symptoms had slowly reappeared and are a lot worse than they were initially.  She has low-grade temperatures, night sweats, decreased appetite, decreased sleep, fatigue and malaise at home.  Since the last 2 days, the patient has had multiple episodes of nausea and emesis and complains she throws up anything that she tries to drink or eat.  She also had an episode of hematochezia.  She is spoke to Dr. Benson Norway earlier today, who ordered a CT scan of her abdomen which revealed sigmoid colon diverticulitis with a small intramural abscess.  He refer her to the emergency department.  She denies dysuria, frequency or hematuria.  She denies dyspnea, chest pain, palpitations, PND, orthopnea or recent lower extremity edema.  No polyuria, polydipsia, polyphagia or blurred vision.  ED Course: Initial vital signs temperature 98.8 F, pulse 85, respiration 18, blood pressure 151/98 mmHg and O2 sat 97% on room air.  Patient was given a 1000 mL of NS bolus, morphine sulfate 4 mg IVP x1, ondansetron 4 mg IVP x1, 400 mg of ciprofloxacin IVPB and 500 mg of metronidazole IVPB.  Urinalysis showed increase in a specific gravity of more than 1.046, with small  hemoglobinuria and rare bacteria on microscopic examination.  The rest of the UA is within expected values.  CBC shows a white count of 10.5, hemoglobin 12.1 g/dL with a MCV of 68.6 fL and platelets 352.  Lipase was normal.  CMP shows a potassium of 3.3 mmol/L and a glucose of 102 mg/dL.  Other values are unremarkable.    Imaging: CT abdomen/pelvis significant for sigmoid diverticulitis with small intramural abscess.  There is no perforation or pelvic abscess.  14 mm endometrial polyp versus submucosal fibroid in the uterus.  Pelvic ultrasound recommended.  Review of Systems: As per HPI otherwise 10 point review of systems negative.   Past Medical History:  Diagnosis Date  . DDD (degenerative disc disease), cervical   . Diverticulitis   . GERD (gastroesophageal reflux disease)    Takes OTC meds  . Headache   . Ovarian cyst   . Uterine fibroid     Past Surgical History:  Procedure Laterality Date  . CESAREAN SECTION  1988  . CHOLECYSTECTOMY  2005   [facility]  . LAPAROSCOPIC APPENDECTOMY N/A 01/31/2014   Procedure: APPENDECTOMY LAPAROSCOPIC;  Surgeon: Gwenyth Ober, MD;  Location: [facility];  Service: General;  Laterality: N/A;     reports that she has never smoked. She has never used smokeless tobacco. She reports that she does not drink alcohol or use drugs.  No Known Allergies  Family History  Problem Relation Age of Onset  . Cancer Mother        breast  . Breast cancer Mother  37s and again in her 64s  . Cancer Sister        breast x2  . Breast cancer Sister        early 12s and again in late 69s  . Cancer Maternal Aunt        ovarian   Prior to Admission medications   Medication Sig Start Date End Date Taking? Authorizing Provider  LINZESS 72 MCG capsule Take 72 mcg by mouth daily. 10/24/17  Yes [provider]    Physical Exam: Vitals:   01/23/19 1351 01/23/19 1519 01/23/19 1520  BP: (!) 151/98 (!) 158/101   Pulse: 85 77 82  Resp: 18 19   Temp: 98.8 F (37.1  C)    TempSrc: Oral    SpO2: 97% 99% 100%    Constitutional: NAD, calm, comfortable Eyes: PERRL, lids and conjunctivae normal ENMT: Mucous membranes are moist. Posterior pharynx clear of any exudate or lesions. Neck: normal, supple, no masses, no thyromegaly Respiratory: Decreased breath sounds on bases, otherwise clear to auscultation bilaterally, no wheezing, no crackles. Normal respiratory effort. No accessory muscle use.  Cardiovascular: Regular rate and rhythm, no murmurs / rubs / gallops. No extremity edema. 2+ pedal pulses. No carotid bruits.  Abdomen:  Nondistended.  Bowel sounds positive. Soft, mild diffuse and significant LUQ tenderness, no guarding or rebound, no masses palpated. No hepatosplenomegaly. Musculoskeletal: no clubbing / cyanosis. Good ROM, no contractures. Normal muscle tone.  Skin: no rashes, lesions, ulcers on limited dermatological examination. Neurologic: CN 2-12 grossly intact. Sensation intact, DTR normal. Strength 5/5 in all 4.  Psychiatric: Normal judgment and insight. Alert and oriented x 4. Normal mood.   Labs on Admission: I have personally reviewed following labs and imaging studies  CBC: Recent Labs  Lab 01/23/19 1356  WBC 10.5  HGB 12.1  HCT 40.9  MCV 68.6*  PLT 073   Basic Metabolic Panel: Recent Labs  Lab 01/23/19 1356  NA 138  K 3.3*  CL 104  CO2 24  GLUCOSE 102*  BUN 8  CREATININE 0.57  CALCIUM 9.3   GFR: CrCl cannot be calculated (Unknown ideal weight.). Liver Function Tests: Recent Labs  Lab 01/23/19 1356  AST 13*  ALT 14  ALKPHOS 68  BILITOT 0.5  PROT 7.9  ALBUMIN 4.1   Recent Labs  Lab 01/23/19 1356  LIPASE 28   No results for input(s): AMMONIA in the last 168 hours. Coagulation Profile: No results for input(s): INR, PROTIME in the last 168 hours. Cardiac Enzymes: No results for input(s): CKTOTAL, CKMB, CKMBINDEX, TROPONINI in the last 168 hours. BNP (last 3 results) No results for input(s): PROBNP in the  last 8760 hours. HbA1C: No results for input(s): HGBA1C in the last 72 hours. CBG: No results for input(s): GLUCAP in the last 168 hours. Lipid Profile: No results for input(s): CHOL, HDL, LDLCALC, TRIG, CHOLHDL, LDLDIRECT in the last 72 hours. Thyroid Function Tests: No results for input(s): TSH, T4TOTAL, FREET4, T3FREE, THYROIDAB in the last 72 hours. Anemia Panel: No results for input(s): VITAMINB12, FOLATE, FERRITIN, TIBC, IRON, RETICCTPCT in the last 72 hours. Urine analysis:    Component Value Date/Time   COLORURINE YELLOW 01/23/2019 1449   APPEARANCEUR CLEAR 01/23/2019 1449   LABSPEC >1.046 (H) 01/23/2019 1449   PHURINE 6.0 01/23/2019 1449   GLUCOSEU NEGATIVE 01/23/2019 1449   HGBUR SMALL (A) 01/23/2019 1449   HGBUR trace-lysed 12/24/2009 0858   BILIRUBINUR NEGATIVE 01/23/2019 1449   KETONESUR NEGATIVE 01/23/2019 1449   PROTEINUR NEGATIVE  01/23/2019 1449   UROBILINOGEN 1.0 01/31/2014 0959   NITRITE NEGATIVE 01/23/2019 1449   LEUKOCYTESUR NEGATIVE 01/23/2019 1449    Radiological Exams on Admission: Ct Abdomen Pelvis W Contrast  Result Date: 01/23/2019 CLINICAL DATA:  Generalized abdominal pain since 12/25/2018. Occasional vomiting. EXAM: CT ABDOMEN AND PELVIS WITH CONTRAST TECHNIQUE: Multidetector CT imaging of the abdomen and pelvis was performed using the standard protocol following bolus administration of intravenous contrast. CONTRAST:  132mL ISOVUE-300 IOPAMIDOL (ISOVUE-300) INJECTION 61% COMPARISON:  12/26/2017 FINDINGS: Lower chest: The lung bases are clear of acute process. No pleural effusion or pulmonary lesions. The heart is normal in size. No pericardial effusion. The distal esophagus and aorta are unremarkable. Hepatobiliary: No focal hepatic lesions or intrahepatic biliary dilatation. The gallbladder is surgically absent. No common bile duct dilatation. Pancreas: No mass, inflammation or ductal dilatation. Spleen: Normal size.  No focal lesions. Adrenals/Urinary  Tract: The adrenal glands and kidneys are unremarkable and stable. No renal, ureteral or bladder calculi or mass. The bladder appears normal. Stomach/Bowel: The stomach, duodenum, small bowel and terminal ileum are normal. No acute inflammatory changes, mass lesions or obstructive findings. The appendix is surgically absent. There are changes of acute diverticulitis involving the sigmoid colon proximally. There is wall thickening, submucosal edema and pericolonic interstitial/inflammatory change. There is also a small, 18 x 15 mm intramural abscess best seen on coronal image 63. No findings for perforation. Vascular/Lymphatic: The aorta is normal in caliber. No dissection. The branch vessels are patent. The major venous structures are patent. No mesenteric or retroperitoneal mass or adenopathy. Small scattered lymph nodes are noted. Reproductive: The uterus and ovaries are unremarkable. Submucosal fibroid versus endometrial polyp. Recommend correlation with pelvic ultrasound examination. The ovaries appear normal. Prominent left-sided parametrial vessels suggesting pelvic congestion syndrome. Other: No pelvic mass or adenopathy. No free pelvic fluid collections. No inguinal mass or adenopathy. No abdominal wall hernia or subcutaneous lesions. Musculoskeletal: No significant bony findings. IMPRESSION: 1. Upper sigmoid colon diverticulitis with a small, 18 x 15 mm intramural abscess. No perforation or pelvic abscess. 2. No other acute abdominal/pelvic findings. 3. 14 mm endometrial polyp versus submucosal fibroid in the uterus. Recommend pelvic ultrasound examination for further evaluation. 4. 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 5. Status post cholecystectomy.  No biliary dilatation. Electronically Signed   By: Marijo Sanes M.D.   On: 01/23/2019 13:05    EKG: Independently reviewed.   Assessment/Plan Principal Problem:   Diverticulitis of sigmoid colon Admit to  MedSurg/inpatient. Keep n.p.o. for few more hours. Continue IV fluids. Trial of clear liquids tonight. Analgesics as needed. Antiemetics as needed. Ciprofloxacin 400 mg IVPB every 12 hours. Metronidazole 500 mg IVPB every 8 hours.  Active Problems:   Microcytic anemia Check anemia panel. Monitor H&H.    GERD (gastroesophageal reflux disease) Protonix 40 mg IVP x1 dose. Start famotidine 20 mg IVP every 12 hours tonight.    Hypokalemia Replacing. Magnesium has been supplemented. Follow-up potassium level.   Submucosal fibroid vs endometrial polyp   DVT prophylaxis: SCDs. Code Status: Full code. Family Communication: Disposition Plan: Admit for IV antibiotic therapy for 2 to 3 days. Consults called:  Admission status: Inpatient/MedSurg.   Reubin Milan MD [facility]  01/23/2019, 4:09 PM   This document was prepared using Dragon voice recognition software and may contain some unintended transcription errors.

## 2019-01-23 NOTE — ED Triage Notes (Signed)
Pt reports she had a CT abd today for ongoing abd pain for the past few weeks, states that shortly after leaving her CT scan,her doctors office called stating her ct showed an infection in her abd and told her to come to the ED meet dr. Benson Norway. Pt endorses vomiting.

## 2019-01-23 NOTE — ED Provider Notes (Signed)
[facility] Provider Note   CSN: [id number] Arrival date & time: 01/23/19  1343    History   Chief Complaint Chief Complaint  Patient presents with  . Abdominal Pain    HPI Katie [facility] is a 53 y.o. female.     The history is provided by the patient and medical records. No language interpreter was used.  Abdominal Pain Associated symptoms: fever, nausea and vomiting   Associated symptoms: no constipation and no diarrhea    Katie [facility] is a 53 y.o. female  with a PMH as listed below who presents to the Emergency Department complaining of lysed abdominal pain.  Patient states her symptoms actually started at the end of May, a little over a month ago.  She was seen in the urgent care and told it was likely her diverticulitis.  She was started on Augmentin.  Felt little better, but never felt as if her symptoms truly resolved.  A few days ago, her pain intensified and she began experiencing nausea and vomiting.  Over the last 2 days, she reports vomiting anytime she has tried to keep down food.  Has been able to keep down fluids a little bit.  She took her temperature last night and it was 101.  Has not checked temperature today.  She spoke with her GI doctor who did CT scan today.  She was called with results and told to come to the emergency department for further evaluation.  CT did show upper sigmoid colon diverticulitis with a small intramural abscess.   Past Medical History:  Diagnosis Date  . DDD (degenerative disc disease), cervical   . Diverticulitis   . GERD (gastroesophageal reflux disease)    Takes OTC meds  . Headache   . Ovarian cyst   . Uterine fibroid     Patient Active Problem List   Diagnosis Date Noted  . Abdominal pain 01/31/2014  . Abdominal pain, chronic, right lower quadrant 01/31/2014  . OBESITY 11/11/2009  . ABSCESS, TOOTH 11/11/2009  . DIVERTICULITIS OF COLON 02/26/2009  . RECTAL BLEEDING 02/12/2009  .  ABDOMINAL PAIN, CHRONIC 02/12/2009  . ANEMIA 11/13/2008  . MIGRAINE HEADACHE 11/13/2008  . HEMOCCULT POSITIVE STOOL 11/13/2008  . MICROSCOPIC HEMATURIA 11/13/2008  . VAGINITIS, BACTERIAL 11/13/2008  . GERD 10/18/2008    Past Surgical History:  Procedure Laterality Date  . CESAREAN SECTION  1988  . CHOLECYSTECTOMY  2005   [facility]  . LAPAROSCOPIC APPENDECTOMY N/A 01/31/2014   Procedure: APPENDECTOMY LAPAROSCOPIC;  Surgeon: Gwenyth Ober, MD;  Location: [facility] OR;  Service: General;  Laterality: N/A;     OB History    Gravida  4   Para  4   Term  4   Preterm      AB      Living        SAB      TAB      Ectopic      Multiple      Live Births               Home Medications    Prior to Admission medications   Medication Sig Start Date End Date Taking? Authorizing Provider  LINZESS 72 MCG capsule Take 72 mcg by mouth daily. 10/24/17  Yes [provider]    Family History Family History  Problem Relation Age of Onset  . Cancer Mother        breast  . Breast cancer Mother  65s and again in her 42s  . Cancer Sister        breast x2  . Breast cancer Sister        early 78s and again in late 43s  . Cancer Maternal Aunt        ovarian    Social History Social History   Tobacco Use  . Smoking status: Never Smoker  . Smokeless tobacco: Never Used  Substance Use Topics  . Alcohol use: No  . Drug use: No     Allergies   Patient has no known allergies.   Review of Systems Review of Systems  Constitutional: Positive for fever.  Gastrointestinal: Positive for abdominal pain, nausea and vomiting. Negative for constipation and diarrhea.  All other systems reviewed and are negative.    Physical Exam Updated Vital Signs BP (!) 158/101   Pulse 82   Temp 98.8 F (37.1 C) (Oral)   Resp 19   SpO2 100%   Physical Exam Vitals signs and nursing note reviewed.  Constitutional:      General: She is not in acute distress.    Appearance: She  is well-developed.  HENT:     Head: Normocephalic and atraumatic.  Neck:     Musculoskeletal: Neck supple.  Cardiovascular:     Rate and Rhythm: Normal rate and regular rhythm.     Heart sounds: Normal heart sounds. No murmur.  Pulmonary:     Effort: Pulmonary effort is normal. No respiratory distress.     Breath sounds: Normal breath sounds.  Abdominal:     General: There is no distension.     Palpations: Abdomen is soft.     Comments: Generalized abdominal tenderness with guarding.   Skin:    General: Skin is warm and dry.  Neurological:     Mental Status: She is alert and oriented to person, place, and time.      ED Treatments / Results  Labs (all labs ordered are listed, but only abnormal results are displayed) Labs Reviewed  COMPREHENSIVE METABOLIC PANEL - Abnormal; Notable for the following components:      Result Value   Potassium 3.3 (*)    Glucose, Bld 102 (*)    AST 13 (*)    All other components within normal limits  CBC - Abnormal; Notable for the following components:   RBC 5.96 (*)    MCV 68.6 (*)    MCH 20.3 (*)    MCHC 29.6 (*)    RDW 19.1 (*)    All other components within normal limits  URINALYSIS, ROUTINE W REFLEX MICROSCOPIC - Abnormal; Notable for the following components:   Specific Gravity, Urine >1.046 (*)    Hgb urine dipstick SMALL (*)    Bacteria, UA RARE (*)    All other components within normal limits  SARS CORONAVIRUS 2 (HOSPITAL ORDER, [facility])  SARS CORONAVIRUS 2 (HOSPITAL ORDER, [facility])  LIPASE, BLOOD  MAGNESIUM    EKG None  Radiology Ct Abdomen Pelvis W Contrast  Result Date: 01/23/2019 CLINICAL DATA:  Generalized abdominal pain since 12/25/2018. Occasional vomiting. EXAM: CT ABDOMEN AND PELVIS WITH CONTRAST TECHNIQUE: Multidetector CT imaging of the abdomen and pelvis was performed using the standard protocol following bolus administration of intravenous contrast.  CONTRAST:  151mL ISOVUE-300 IOPAMIDOL (ISOVUE-300) INJECTION 61% COMPARISON:  12/26/2017 FINDINGS: Lower chest: The lung bases are clear of acute process. No pleural effusion or pulmonary lesions. The heart is normal  in size. No pericardial effusion. The distal esophagus and aorta are unremarkable. Hepatobiliary: No focal hepatic lesions or intrahepatic biliary dilatation. The gallbladder is surgically absent. No common bile duct dilatation. Pancreas: No mass, inflammation or ductal dilatation. Spleen: Normal size.  No focal lesions. Adrenals/Urinary Tract: The adrenal glands and kidneys are unremarkable and stable. No renal, ureteral or bladder calculi or mass. The bladder appears normal. Stomach/Bowel: The stomach, duodenum, small bowel and terminal ileum are normal. No acute inflammatory changes, mass lesions or obstructive findings. The appendix is surgically absent. There are changes of acute diverticulitis involving the sigmoid colon proximally. There is wall thickening, submucosal edema and pericolonic interstitial/inflammatory change. There is also a small, 18 x 15 mm intramural abscess best seen on coronal image 63. No findings for perforation. Vascular/Lymphatic: The aorta is normal in caliber. No dissection. The branch vessels are patent. The major venous structures are patent. No mesenteric or retroperitoneal mass or adenopathy. Small scattered lymph nodes are noted. Reproductive: The uterus and ovaries are unremarkable. Submucosal fibroid versus endometrial polyp. Recommend correlation with pelvic ultrasound examination. The ovaries appear normal. Prominent left-sided parametrial vessels suggesting pelvic congestion syndrome. Other: No pelvic mass or adenopathy. No free pelvic fluid collections. No inguinal mass or adenopathy. No abdominal wall hernia or subcutaneous lesions. Musculoskeletal: No significant bony findings. IMPRESSION: 1. Upper sigmoid colon diverticulitis with a small, 18 x 15 mm  intramural abscess. No perforation or pelvic abscess. 2. No other acute abdominal/pelvic findings. 3. 14 mm endometrial polyp versus submucosal fibroid in the uterus. Recommend pelvic ultrasound examination for further evaluation. 4. 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 5. Status post cholecystectomy.  No biliary dilatation. Electronically Signed   By: Marijo Sanes M.D.   On: 01/23/2019 13:05    Procedures Procedures (including critical care time)  Medications Ordered in ED Medications  ciprofloxacin (CIPRO) IVPB 400 mg (400 mg Intravenous New Bag/Given 01/23/19 1525)    And  metroNIDAZOLE (FLAGYL) IVPB 500 mg (has no administration in time range)  0.9 % NaCl with KCl 40 mEq / L  infusion (has no administration in time range)  magnesium sulfate IVPB 2 g 50 mL (has no administration in time range)  sodium chloride 0.9 % bolus 1,000 mL (1,000 mLs Intravenous New Bag/Given 01/23/19 1519)  morphine 4 MG/ML injection 4 mg (4 mg Intravenous Given 01/23/19 1522)  ondansetron (ZOFRAN) injection 4 mg (4 mg Intravenous Given 01/23/19 1522)     Initial Impression / Assessment and Plan / ED Course  I have reviewed the triage vital signs and the nursing notes.  Pertinent labs & imaging results that were available during my care of the patient were reviewed by me and considered in my medical decision making (see chart for details).       [facility] is a 53 y.o. female who presents to ED for persistent abdominal pain for a little over a month with acute worsening over the last couple of days.  She does report temperature of 101 at home.  Afebrile in the emergency department upon arrival.  Vitals are stable.  Generalized abdominal tenderness with guarding.  She had outpatient CT scan done today ordered by her GI doctor showing upper sigmoid colon diverticulitis with small 18 x 15 mm intramural abscess.  Labs show white count of 10.5.  She was seen at urgent care on  5/23 where she was treated with Augmentin for presumed diverticulitis.  Given CT findings, history of fever/nausea/vomiting with acute worsening and  likely outpatient antibiotic failure, believe admission for IV antibiotics would be in the patient's best interest.  3:13 PM - Discussed case with her GI doctor, Dr. Benson Norway, who referred patient to ED today. Agrees with admission to hospitalist service for IV ABX. Given treatment in early May with Augmentin, recommends cipro/flagyl.   Hospitalist consulted who will admit.   Final Clinical Impressions(s) / ED Diagnoses   Final diagnoses:  Diverticulitis    ED Discharge Orders    None       Ward, Ozella Almond, PA-C 01/23/19 1550    Nat Christen, MD 01/29/19 [id number]

## 2019-01-24 DIAGNOSIS — D509 Iron deficiency anemia, unspecified: Secondary | ICD-10-CM

## 2019-01-24 DIAGNOSIS — K219 Gastro-esophageal reflux disease without esophagitis: Secondary | ICD-10-CM

## 2019-01-24 DIAGNOSIS — E876 Hypokalemia: Secondary | ICD-10-CM

## 2019-01-24 DIAGNOSIS — K5732 Diverticulitis of large intestine without perforation or abscess without bleeding: Principal | ICD-10-CM

## 2019-01-24 LAB — BASIC METABOLIC PANEL
Anion gap: 9 (ref 5–15)
BUN: 5 mg/dL — ABNORMAL LOW (ref 6–20)
CO2: 23 mmol/L (ref 22–32)
Calcium: 8.6 mg/dL — ABNORMAL LOW (ref 8.9–10.3)
Chloride: 107 mmol/L (ref 98–111)
Creatinine, Ser: 0.43 mg/dL — ABNORMAL LOW (ref 0.44–1.00)
GFR calc Af Amer: >60 mL/min (ref >60)
GFR calc non Af Amer: >60 mL/min (ref >60)
Glucose, Bld: 113 mg/dL — ABNORMAL HIGH (ref 70–99)
Potassium: 3.9 mmol/L (ref 3.5–5.1)
Sodium: 139 mmol/L (ref 135–145)

## 2019-01-24 LAB — CBC WITH DIFFERENTIAL/PLATELET
Abs Immature Granulocytes: 0.03 10*3/uL (ref 0.00–0.07)
Basophils Absolute: 0 10*3/uL (ref 0.0–0.1)
Basophils Relative: 0 %
Eosinophils Absolute: 0 10*3/uL (ref 0.0–0.5)
Eosinophils Relative: 0 %
HCT: 36.1 % (ref 36.0–46.0)
Hemoglobin: 11 g/dL — ABNORMAL LOW (ref 12.0–15.0)
Immature Granulocytes: 0 %
Lymphocytes Relative: 13 %
Lymphs Abs: 1.4 10*3/uL (ref 0.7–4.0)
MCH: 20.5 pg — ABNORMAL LOW (ref 26.0–34.0)
MCHC: 30.5 g/dL (ref 30.0–36.0)
MCV: 67.2 fL — ABNORMAL LOW (ref 80.0–100.0)
Monocytes Absolute: 0.5 10*3/uL (ref 0.1–1.0)
Monocytes Relative: 5 %
Neutro Abs: 8.8 10*3/uL — ABNORMAL HIGH (ref 1.7–7.7)
Neutrophils Relative %: 82 %
Platelets: 316 10*3/uL (ref 150–400)
RBC: 5.37 MIL/uL — ABNORMAL HIGH (ref 3.87–5.11)
RDW: 18.3 % — ABNORMAL HIGH (ref 11.5–15.5)
WBC: 10.7 10*3/uL — ABNORMAL HIGH (ref 4.0–10.5)
nRBC: 0 % (ref 0.0–0.2)

## 2019-01-24 LAB — IRON AND TIBC
Iron: 20 ug/dL — ABNORMAL LOW (ref 28–170)
Saturation Ratios: 6 % — ABNORMAL LOW (ref 10.4–31.8)
TIBC: 344 ug/dL (ref 250–450)
UIBC: 324 ug/dL

## 2019-01-24 LAB — HIV ANTIBODY (ROUTINE TESTING W REFLEX): HIV Screen 4th Generation wRfx: NONREACTIVE

## 2019-01-24 LAB — FOLATE: Folate: 18.2 ng/mL (ref >5.9)

## 2019-01-24 LAB — RETICULOCYTES
Immature Retic Fract: 15.9 % (ref 2.3–15.9)
RBC.: 5.37 MIL/uL — ABNORMAL HIGH (ref 3.87–5.11)
Retic Count, Absolute: 56.9 10*3/uL (ref 19.0–186.0)
Retic Ct Pct: 1.1 % (ref 0.4–3.1)

## 2019-01-24 LAB — FERRITIN: Ferritin: 94 ng/mL (ref 11–307)

## 2019-01-24 LAB — VITAMIN B12: Vitamin B-12: 448 pg/mL (ref 180–914)

## 2019-01-24 MED ORDER — ENOXAPARIN SODIUM 60 MG/0.6ML ~~LOC~~ SOLN
50.0000 mg | SUBCUTANEOUS | Status: DC
  Administered 2019-01-24: 50 mg via SUBCUTANEOUS
  Filled 2019-01-24: qty 0.6

## 2019-01-24 MED ORDER — OXYCODONE-ACETAMINOPHEN 5-325 MG PO TABS
1.0000 | ORAL_TABLET | ORAL | Status: DC | PRN
  Administered 2019-01-24: 2 via ORAL
  Administered 2019-01-24 – 2019-01-25 (×2): 1 via ORAL
  Filled 2019-01-24: qty 2
  Filled 2019-01-24 (×3): qty 1

## 2019-01-24 NOTE — Progress Notes (Signed)
Pt reported  reported  Nausea/ Vomiting  and  " head spinning sensation"  After Dilaudid  I mg IV was administered. Allergy list updated.  MD notified to D/C medication.

## 2019-01-24 NOTE — Progress Notes (Signed)
PROGRESS NOTE    [facility]  ZOX:[id number] DOB: 12-28-1965 DOA: 01/23/2019 PCP: Minette Brine, FNP    Brief Narrative:  53 y/o female admitted to the hospital abdominal pain. She was found to have sigmoid diverticulitis on outpatient CT abdomen and was started on augmentin. When her symptoms did not improve, she as admitted to the hospital for IV antibiotics.   Assessment & Plan:   Principal Problem:   Diverticulitis of sigmoid colon Active Problems:   Microcytic anemia   GERD (gastroesophageal reflux disease)   Hypokalemia   1. Acute sigmoid diverticulitis. Noted to have small intramural abscess on CT abdomen. She appears to be clinically improving with intravenous antibiotics. Started on clear liquids this morning. If she tolerates this, can likely advance to soft diet in AM. Anticipate discharge home in the next 24 hours on oral antibiotics if she continues to improve 2. Hypokalemia. Replaced. Mag normal. 3. GERD. Continue on PPI 4. Iron deficiency anemia, likely related to uterine fibroids. No indication for transfusion for prbc. Will discharge home on oral iron tabs. She will need follow up with her gynecologist   DVT prophylaxis: lovenox Code Status: full code Family Communication: left voicemail for husband Disposition Plan: discharge home once tolerating po intake and pain is controlled, hopefully in AM   Consultants:     Procedures:     Antimicrobials:   Ciprofloxacin 6/30>  Flagyl 6/30>    Subjective: Nausea and vomiting is better. Still has abdominal pain  Objective: Vitals:   01/23/19 2124 01/23/19 2329 01/24/19 0547 01/24/19 1631  BP: (!) 149/72  (!) 147/104 (!) 154/96  Pulse: 79  94 88  Resp:    20  Temp: 97.6 F (36.4 C)  98.5 F (36.9 C) 98.6 F (37 C)  TempSrc: Axillary  Oral Oral  SpO2: 98%  98% 100%  Weight:  94.8 kg    Height:  4\' 11"  (1.499 m)      Intake/Output Summary (Last 24 hours) at 01/24/2019 1817 Last data filed at  01/24/2019 1609 Gross per 24 hour  Intake 2180.07 ml  Output -  Net 2180.07 ml   Filed Weights   01/23/19 2329  Weight: 94.8 kg    Examination:  General exam: Appears calm and comfortable  Respiratory system: Clear to auscultation. Respiratory effort normal. Cardiovascular system: S1 & S2 heard, RRR. No JVD, murmurs, rubs, gallops or clicks. No pedal edema. Gastrointestinal system: Abdomen is nondistended, soft and tender in LLQ. No organomegaly or masses felt. Normal bowel sounds heard. Central nervous system: Alert and oriented. No focal neurological deficits. Extremities: Symmetric 5 x 5 power. Skin: No rashes, lesions or ulcers Psychiatry: Judgement and insight appear normal. Mood & affect appropriate.     Data Reviewed: I have personally reviewed following labs and imaging studies  CBC: Recent Labs  Lab 01/23/19 1356 01/24/19 0309  WBC 10.5 10.7*  NEUTROABS  --  8.8*  HGB 12.1 11.0*  HCT 40.9 36.1  MCV 68.6* 67.2*  PLT 352 811   Basic Metabolic Panel: Recent Labs  Lab 01/23/19 1356 01/23/19 1915 01/24/19 0309  NA 138  --  139  K 3.3*  --  3.9  CL 104  --  107  CO2 24  --  23  GLUCOSE 102*  --  113*  BUN 8  --  5*  CREATININE 0.57  --  0.43*  CALCIUM 9.3  --  8.6*  MG  --  2.1  --    GFR: Estimated  Creatinine Clearance: 81.9 mL/min (A) (by C-G formula based on SCr of 0.43 mg/dL (L)). Liver Function Tests: Recent Labs  Lab 01/23/19 1356  AST 13*  ALT 14  ALKPHOS 68  BILITOT 0.5  PROT 7.9  ALBUMIN 4.1   Recent Labs  Lab 01/23/19 1356  LIPASE 28   No results for input(s): AMMONIA in the last 168 hours. Coagulation Profile: No results for input(s): INR, PROTIME in the last 168 hours. Cardiac Enzymes: No results for input(s): CKTOTAL, CKMB, CKMBINDEX, TROPONINI in the last 168 hours. BNP (last 3 results) No results for input(s): PROBNP in the last 8760 hours. HbA1C: No results for input(s): HGBA1C in the last 72 hours. CBG: No results for  input(s): GLUCAP in the last 168 hours. Lipid Profile: No results for input(s): CHOL, HDL, LDLCALC, TRIG, CHOLHDL, LDLDIRECT in the last 72 hours. Thyroid Function Tests: No results for input(s): TSH, T4TOTAL, FREET4, T3FREE, THYROIDAB in the last 72 hours. Anemia Panel: Recent Labs    01/24/19 0309  VITAMINB12 448  FOLATE 18.2  FERRITIN 94  TIBC 344  IRON 20*  RETICCTPCT 1.1   Sepsis Labs: No results for input(s): PROCALCITON, LATICACIDVEN in the last 168 hours.  Recent Results (from the past 240 hour(s))  SARS Coronavirus 2 [facility] order, Performed in [facility] hospital lab)     Status: None   Collection Time: 01/23/19  3:13 PM  Result Value Ref Range Status   SARS Coronavirus 2 NEGATIVE NEGATIVE Final    Comment: (NOTE) If result is NEGATIVE SARS-CoV-2 target nucleic acids are NOT DETECTED. The SARS-CoV-2 RNA is generally detectable in upper and lower  respiratory specimens during the acute phase of infection. The lowest  concentration of SARS-CoV-2 viral copies this assay can detect is 250  copies / mL. A negative result does not preclude SARS-CoV-2 infection  and should not be used as the sole basis for treatment or other  patient management decisions.  A negative result may occur with  improper specimen collection / handling, submission of specimen other  than nasopharyngeal swab, presence of viral mutation(s) within the  areas targeted by this assay, and inadequate number of viral copies  (<250 copies / mL). A negative result must be combined with clinical  observations, patient history, and epidemiological information. If result is POSITIVE SARS-CoV-2 target nucleic acids are DETECTED. The SARS-CoV-2 RNA is generally detectable in upper and lower  respiratory specimens dur ing the acute phase of infection.  Positive  results are indicative of active infection with SARS-CoV-2.  Clinical  correlation with patient history and other diagnostic information is   necessary to determine patient infection status.  Positive results do  not rule out bacterial infection or co-infection with other viruses. If result is PRESUMPTIVE POSTIVE SARS-CoV-2 nucleic acids MAY BE PRESENT.   A presumptive positive result was obtained on the submitted specimen  and confirmed on repeat testing.  While 2019 novel coronavirus  (SARS-CoV-2) nucleic acids may be present in the submitted sample  additional confirmatory testing may be necessary for epidemiological  and / or clinical management purposes  to differentiate between  SARS-CoV-2 and other Sarbecovirus currently known to infect humans.  If clinically indicated additional testing with an alternate test  methodology [phone]) is advised. The SARS-CoV-2 RNA is generally  detectable in upper and lower respiratory sp ecimens during the acute  phase of infection. The expected result is Negative. Fact Sheet for Patients:  StrictlyIdeas.no Fact Sheet for Healthcare Providers: BankingDealers.co.za This test is not  yet approved or cleared by the [place] and has been authorized for detection and/or diagnosis of SARS-CoV-2 by FDA under an Emergency Use Authorization (EUA).  This EUA will remain in effect (meaning this test can be used) for the duration of the COVID-19 declaration under Section 564(b)(1) of the Act, 21 U.S.C. section 360bbb-3(b)(1), unless the authorization is terminated or revoked sooner. Performed at [facility], [place] [address] [place], [place] [address]          Radiology Studies: Ct Abdomen Pelvis W Contrast  Result Date: 01/23/2019 CLINICAL DATA:  Generalized abdominal pain since 12/25/2018. Occasional vomiting. EXAM: CT ABDOMEN AND PELVIS WITH CONTRAST TECHNIQUE: Multidetector CT imaging of the abdomen and pelvis was performed using the standard protocol following bolus administration of intravenous contrast. CONTRAST:  166mL  ISOVUE-300 IOPAMIDOL (ISOVUE-300) INJECTION 61% COMPARISON:  12/26/2017 FINDINGS: Lower chest: The lung bases are clear of acute process. No pleural effusion or pulmonary lesions. The heart is normal in size. No pericardial effusion. The distal esophagus and aorta are unremarkable. Hepatobiliary: No focal hepatic lesions or intrahepatic biliary dilatation. The gallbladder is surgically absent. No common bile duct dilatation. Pancreas: No mass, inflammation or ductal dilatation. Spleen: Normal size.  No focal lesions. Adrenals/Urinary Tract: The adrenal glands and kidneys are unremarkable and stable. No renal, ureteral or bladder calculi or mass. The bladder appears normal. Stomach/Bowel: The stomach, duodenum, small bowel and terminal ileum are normal. No acute inflammatory changes, mass lesions or obstructive findings. The appendix is surgically absent. There are changes of acute diverticulitis involving the sigmoid colon proximally. There is wall thickening, submucosal edema and pericolonic interstitial/inflammatory change. There is also a small, 18 x 15 mm intramural abscess best seen on coronal image 63. No findings for perforation. Vascular/Lymphatic: The aorta is normal in caliber. No dissection. The branch vessels are patent. The major venous structures are patent. No mesenteric or retroperitoneal mass or adenopathy. Small scattered lymph nodes are noted. Reproductive: The uterus and ovaries are unremarkable. Submucosal fibroid versus endometrial polyp. Recommend correlation with pelvic ultrasound examination. The ovaries appear normal. Prominent left-sided parametrial vessels suggesting pelvic congestion syndrome. Other: No pelvic mass or adenopathy. No free pelvic fluid collections. No inguinal mass or adenopathy. No abdominal wall hernia or subcutaneous lesions. Musculoskeletal: No significant bony findings. IMPRESSION: 1. Upper sigmoid colon diverticulitis with a small, 18 x 15 mm intramural abscess. No  perforation or pelvic abscess. 2. No other acute abdominal/pelvic findings. 3. 14 mm endometrial polyp versus submucosal fibroid in the uterus. Recommend pelvic ultrasound examination for further evaluation. 4. 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 5. Status post cholecystectomy.  No biliary dilatation. Electronically Signed   By: Marijo Sanes M.D.   On: 01/23/2019 13:05        Scheduled Meds: . feeding supplement  1 Container Oral TID BM   Continuous Infusions: . 0.9 % NaCl with KCl 40 mEq / L Stopped (01/24/19 1609)  . ciprofloxacin 400 mg (01/24/19 1609)  . famotidine (PEPCID) IV 20 mg (01/24/19 1131)  . metronidazole Stopped (01/24/19 1542)     LOS: 1 day    Time spent: 33mins    Kathie Dike, MD [facility]   If 7PM-7AM, please contact night-coverage [email or web address]  01/24/2019, 6:17 PM

## 2019-01-24 NOTE — Progress Notes (Signed)
Initial Nutrition Assessment RD working remotely.  DOCUMENTATION CODES:   Morbid obesity  INTERVENTION:  Boost Breeze po TID, each supplement provides 250 kcal and 9 grams of protein MVI   NUTRITION DIAGNOSIS:   Inadequate oral intake related to altered GI function as evidenced by other (comment)(CL diet order insufficient to meet estimated needs).   GOAL:   Patient will meet greater than or equal to 90% of their needs   MONITOR:   PO intake, Labs, I & O's, Supplement acceptance, Diet advancement  REASON FOR ASSESSMENT:   Malnutrition Screening Tool    ASSESSMENT:  53 year old female with medical history significant of cervical DDD, diverticulitis, GERD, uterine fibroid, cholecystectomy admitted with sigmoid diverticulitis w/ small intramural abscess s/p experiencing abdominal pain since May and episodes of nausea/vomiting x 2 days  Patient was seen by Dr. Benson Norway from GI after completion of 2 week course of Augmentin prescribed by urgent care physician. Pt reported improvement to symptoms after antibiotic course, but a slow return of pain that became increasingly worse.  Patient did not answer phone this afternoon when RD attempted to contact.  Medications reviewed and include: Boost Breeze TID, NaCl with 40 mEq/L @100Ml /hr, Cipro, Pepcid, Flagyl, Zofran, OxyCodone  Labs reviewed  Wts reviewed - stable  NUTRITION - FOCUSED PHYSICAL EXAM:   Diet Order:   Diet Order            Diet clear liquid Room service appropriate? Yes; Fluid consistency: Thin  Diet effective now              EDUCATION NEEDS:   Not appropriate for education at this time  Skin:  Skin Assessment: Reviewed RN Assessment  Last BM:  6/29  Height:   Ht Readings from Last 1 Encounters:  01/23/19 4\' 11"  (1.499 m)    Weight:   Wt Readings from Last 1 Encounters:  01/23/19 94.8 kg    Ideal Body Weight:  43.2 kg  BMI:  Body mass index is 42.21 kg/m.  Estimated Nutritional Needs:    Kcal:  1900-2100  Protein:  104-115g  Fluid:  2L/day    Lajuan Lines, RD, LDN  After Hours/Weekend Pager: [phone]

## 2019-01-25 LAB — CBC WITH DIFFERENTIAL/PLATELET
Abs Immature Granulocytes: 0.03 10*3/uL (ref 0.00–0.07)
Basophils Absolute: 0 10*3/uL (ref 0.0–0.1)
Basophils Relative: 0 %
Eosinophils Absolute: 0 10*3/uL (ref 0.0–0.5)
Eosinophils Relative: 0 %
HCT: 36.9 % (ref 36.0–46.0)
Hemoglobin: 11.2 g/dL — ABNORMAL LOW (ref 12.0–15.0)
Immature Granulocytes: 0 %
Lymphocytes Relative: 24 %
Lymphs Abs: 2.2 10*3/uL (ref 0.7–4.0)
MCH: 20.4 pg — ABNORMAL LOW (ref 26.0–34.0)
MCHC: 30.4 g/dL (ref 30.0–36.0)
MCV: 67.2 fL — ABNORMAL LOW (ref 80.0–100.0)
Monocytes Absolute: 0.6 10*3/uL (ref 0.1–1.0)
Monocytes Relative: 6 %
Neutro Abs: 6.4 10*3/uL (ref 1.7–7.7)
Neutrophils Relative %: 70 %
Platelets: 314 10*3/uL (ref 150–400)
RBC: 5.49 MIL/uL — ABNORMAL HIGH (ref 3.87–5.11)
RDW: 18.5 % — ABNORMAL HIGH (ref 11.5–15.5)
WBC: 9.2 10*3/uL (ref 4.0–10.5)
nRBC: 0 % (ref 0.0–0.2)

## 2019-01-25 MED ORDER — ONDANSETRON 4 MG PO TBDP: 4.0000 mg | ORAL_TABLET | Freq: Three times a day (TID) | ORAL | 0 refills | Status: DC | PRN

## 2019-01-25 MED ORDER — METRONIDAZOLE 500 MG PO TABS: 500.0000 mg | ORAL_TABLET | Freq: Three times a day (TID) | ORAL | 0 refills | Status: AC

## 2019-01-25 MED ORDER — OXYCODONE-ACETAMINOPHEN 5-325 MG PO TABS: 1.0000 | ORAL_TABLET | Freq: Four times a day (QID) | ORAL | 0 refills | Status: DC | PRN

## 2019-01-25 MED ORDER — CIPROFLOXACIN HCL 500 MG PO TABS: 500.0000 mg | ORAL_TABLET | Freq: Two times a day (BID) | ORAL | 0 refills | Status: AC

## 2019-01-25 NOTE — Discharge Summary (Addendum)
Physician Discharge Summary  ROBEN TATSCH GEX:[id number] DOB: 12/31/1965 DOA: 01/23/2019  PCP: Minette Brine, FNP  Admit date: 01/23/2019 Discharge date: 01/25/2019  Admitted From: home Disposition:  home  Recommendations for Outpatient Follow-up:  Follow up with PCP in 1-2 weeks Please obtain BMP/CBC in one week Consider pelvic ultrasound to evaluate for possible uterine fibroids Follow up with Dr. Benson Norway in 2 weeks  Discharge Condition:stable CODE STATUS: full code Diet recommendation: soft diet  Brief/Interim Summary: 53 year old female admitted to the hospital with abdominal pain.  She had an outpatient CT abdomen which showed sigmoid diverticulitis and intramural abscess.  She was initially started on Augmentin, but when her symptoms did not improve, she was admitted to the hospital for IV antibiotics.  The patient was treated with ciprofloxacin and Flagyl.  With IV antibiotics and bowel rest, her symptoms have improved.  Abdominal pain is substantially better and she is not having any nausea and vomiting.  Diet has been advanced to solid food and she appears to be tolerating this.  I discussed her case with Dr. Collene Mares on-call for Dr. Benson Norway who recommended outpatient follow-up in the next 2 weeks with Dr. Benson Norway.  Patient will be continued on oral ciprofloxacin and Flagyl on discharge.  Other incidental finding was possible uterine fibroids on CT imaging.  This can be further evaluated with pelvic ultrasound as an outpatient.  Patient otherwise feels improved and feels ready for discharge.  Discharge Diagnoses:  Principal Problem:   Diverticulitis of sigmoid colon Active Problems:   Microcytic anemia   GERD (gastroesophageal reflux disease)   Hypokalemia   Morbid obesity   Discharge Instructions  Discharge Instructions     Diet - low sodium heart healthy   Complete by: As directed    Increase activity slowly   Complete by: As directed       Allergies as of 01/25/2019        Reactions   Dilaudid [hydromorphone Hcl]    Nausea/ Vomiting  and  " head spinning sensation"         Medication List     STOP taking these medications    Linzess 72 MCG capsule Generic drug: linaclotide       TAKE these medications    ciprofloxacin 500 MG tablet Commonly known as: Cipro Take 1 tablet (500 mg total) by mouth 2 (two) times daily for 10 days.   metroNIDAZOLE 500 MG tablet Commonly known as: Flagyl Take 1 tablet (500 mg total) by mouth 3 (three) times daily for 10 days.   ondansetron 4 MG disintegrating tablet Commonly known as: Zofran ODT Take 1 tablet (4 mg total) by mouth every 8 (eight) hours as needed for nausea or vomiting.   oxyCODONE-acetaminophen 5-325 MG tablet Commonly known as: PERCOCET/ROXICET Take 1-2 tablets by mouth every 6 (six) hours as needed for moderate pain.       Follow-up Information     Carol Ada, MD. Schedule an appointment as soon as possible for a visit in 2 week(s).   Specialty: Gastroenterology Why: Dr. Ulyses Amor office will call you with an appointment Contact information: [place], [place] [address] [phone]         Minette Brine, [place] Follow up.   Specialty: General Practice Why: talk to your primary doctor about a pelvic ultrasound to evaluate for possible uterine fibroids Contact information: [address] [place] [address] [phone]           Allergies  Allergen Reactions  Dilaudid [Hydromorphone Hcl]     Nausea/ Vomiting  and  " head spinning sensation"     Consultations:    Procedures/Studies: Ct Abdomen Pelvis W Contrast  Result Date: 01/23/2019 CLINICAL DATA:  Generalized abdominal pain since 12/25/2018. Occasional vomiting. EXAM: CT ABDOMEN AND PELVIS WITH CONTRAST TECHNIQUE: Multidetector CT imaging of the abdomen and pelvis was performed using the standard protocol following bolus administration of intravenous contrast. CONTRAST:   138mL ISOVUE-300 IOPAMIDOL (ISOVUE-300) INJECTION 61% COMPARISON:  12/26/2017 FINDINGS: Lower chest: The lung bases are clear of acute process. No pleural effusion or pulmonary lesions. The heart is normal in size. No pericardial effusion. The distal esophagus and aorta are unremarkable. Hepatobiliary: No focal hepatic lesions or intrahepatic biliary dilatation. The gallbladder is surgically absent. No common bile duct dilatation. Pancreas: No mass, inflammation or ductal dilatation. Spleen: Normal size.  No focal lesions. Adrenals/Urinary Tract: The adrenal glands and kidneys are unremarkable and stable. No renal, ureteral or bladder calculi or mass. The bladder appears normal. Stomach/Bowel: The stomach, duodenum, small bowel and terminal ileum are normal. No acute inflammatory changes, mass lesions or obstructive findings. The appendix is surgically absent. There are changes of acute diverticulitis involving the sigmoid colon proximally. There is wall thickening, submucosal edema and pericolonic interstitial/inflammatory change. There is also a small, 18 x 15 mm intramural abscess best seen on coronal image 63. No findings for perforation. Vascular/Lymphatic: The aorta is normal in caliber. No dissection. The branch vessels are patent. The major venous structures are patent. No mesenteric or retroperitoneal mass or adenopathy. Small scattered lymph nodes are noted. Reproductive: The uterus and ovaries are unremarkable. Submucosal fibroid versus endometrial polyp. Recommend correlation with pelvic ultrasound examination. The ovaries appear normal. Prominent left-sided parametrial vessels suggesting pelvic congestion syndrome. Other: No pelvic mass or adenopathy. No free pelvic fluid collections. No inguinal mass or adenopathy. No abdominal wall hernia or subcutaneous lesions. Musculoskeletal: No significant bony findings. IMPRESSION: 1. Upper sigmoid colon diverticulitis with a small, 18 x 15 mm intramural  abscess. No perforation or pelvic abscess. 2. No other acute abdominal/pelvic findings. 3. 14 mm endometrial polyp versus submucosal fibroid in the uterus. Recommend pelvic ultrasound examination for further evaluation. 4. 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 5. Status post cholecystectomy.  No biliary dilatation. Electronically Signed   By: Marijo Sanes M.D.   On: 01/23/2019 13:05      Subjective: Abdominal pain improving, no nausea or vomiting, tolerating solid foods  Discharge Exam: Vitals:   01/24/19 2149 01/24/19 2154 01/25/19 0627 01/25/19 0628  BP: (!) 168/96 (!) 156/104 (!) 178/101 (!) 161/104  Pulse: 79  92 92  Resp: 15  16   Temp: 98.7 F (37.1 C)  98.3 F (36.8 C)   TempSrc: Oral  Oral   SpO2: 100%  100%   Weight:      Height:        General: Pt is alert, awake, not in acute distress Cardiovascular: RRR, S1/S2 +, no rubs, no gallops Respiratory: CTA bilaterally, no wheezing, no rhonchi Abdominal: Soft, mild tenderness in LLQ, ND, bowel sounds + Extremities: no edema, no cyanosis    The results of significant diagnostics from this hospitalization (including imaging, microbiology, ancillary and laboratory) are listed below for reference.     Microbiology: Recent Results (from the past 240 hour(s))  SARS Coronavirus 2 [facility] order, Performed in [place] hospital lab)     Status: None   Collection Time: 01/23/19  3:13 PM  Result Value  Ref Range Status   SARS Coronavirus 2 NEGATIVE NEGATIVE Final    Comment: (NOTE) If result is NEGATIVE SARS-CoV-2 target nucleic acids are NOT DETECTED. The SARS-CoV-2 RNA is generally detectable in upper and lower  respiratory specimens during the acute phase of infection. The lowest  concentration of SARS-CoV-2 viral copies this assay can detect is 250  copies / mL. A negative result does not preclude SARS-CoV-2 infection  and should not be used as the sole basis for treatment or other   patient management decisions.  A negative result may occur with  improper specimen collection / handling, submission of specimen other  than nasopharyngeal swab, presence of viral mutation(s) within the  areas targeted by this assay, and inadequate number of viral copies  (<250 copies / mL). A negative result must be combined with clinical  observations, patient history, and epidemiological information. If result is POSITIVE SARS-CoV-2 target nucleic acids are DETECTED. The SARS-CoV-2 RNA is generally detectable in upper and lower  respiratory specimens dur ing the acute phase of infection.  Positive  results are indicative of active infection with SARS-CoV-2.  Clinical  correlation with patient history and other diagnostic information is  necessary to determine patient infection status.  Positive results do  not rule out bacterial infection or co-infection with other viruses. If result is PRESUMPTIVE POSTIVE SARS-CoV-2 nucleic acids MAY BE PRESENT.   A presumptive positive result was obtained on the submitted specimen  and confirmed on repeat testing.  While 2019 novel coronavirus  (SARS-CoV-2) nucleic acids may be present in the submitted sample  additional confirmatory testing may be necessary for epidemiological  and / or clinical management purposes  to differentiate between  SARS-CoV-2 and other Sarbecovirus currently known to infect humans.  If clinically indicated additional testing with an alternate test  methodology [phone]) is advised. The SARS-CoV-2 RNA is generally  detectable in upper and lower respiratory sp ecimens during the acute  phase of infection. The expected result is Negative. Fact Sheet for Patients:  StrictlyIdeas.no Fact Sheet for Healthcare Providers: BankingDealers.co.za This test is not yet approved or cleared by the [place] FDA and has been authorized for detection and/or diagnosis of SARS-CoV-2  by FDA under an Emergency Use Authorization (EUA).  This EUA will remain in effect (meaning this test can be used) for the duration of the COVID-19 declaration under Section 564(b)(1) of the Act, 21 U.S.C. section 360bbb-3(b)(1), unless the authorization is terminated or revoked sooner. Performed at [facility], [place] [address] [place], [place] [address]      Labs: BNP (last 3 results) No results for input(s): BNP in the last 8760 hours. Basic Metabolic Panel: Recent Labs  Lab 01/23/19 1356 01/23/19 1915 01/24/19 0309  NA 138  --  139  K 3.3*  --  3.9  CL 104  --  107  CO2 24  --  23  GLUCOSE 102*  --  113*  BUN 8  --  5*  CREATININE 0.57  --  0.43*  CALCIUM 9.3  --  8.6*  MG  --  2.1  --    Liver Function Tests: Recent Labs  Lab 01/23/19 1356  AST 13*  ALT 14  ALKPHOS 68  BILITOT 0.5  PROT 7.9  ALBUMIN 4.1   Recent Labs  Lab 01/23/19 1356  LIPASE 28   No results for input(s): AMMONIA in the last 168 hours. CBC: Recent Labs  Lab 01/23/19 1356 01/24/19 0309 01/25/19 0240  WBC 10.5  10.7* 9.2  NEUTROABS  --  8.8* 6.4  HGB 12.1 11.0* 11.2*  HCT 40.9 36.1 36.9  MCV 68.6* 67.2* 67.2*  PLT 352 316 314   Cardiac Enzymes: No results for input(s): CKTOTAL, CKMB, CKMBINDEX, TROPONINI in the last 168 hours. BNP: Invalid input(s): POCBNP CBG: No results for input(s): GLUCAP in the last 168 hours. D-Dimer No results for input(s): DDIMER in the last 72 hours. Hgb A1c No results for input(s): HGBA1C in the last 72 hours. Lipid Profile No results for input(s): CHOL, HDL, LDLCALC, TRIG, CHOLHDL, LDLDIRECT in the last 72 hours. Thyroid function studies No results for input(s): TSH, T4TOTAL, T3FREE, THYROIDAB in the last 72 hours.  Invalid input(s): FREET3 Anemia work up Recent Labs    01/24/19 0309  VITAMINB12 448  FOLATE 18.2  FERRITIN 94  TIBC 344  IRON 20*  RETICCTPCT 1.1   Urinalysis    Component Value Date/Time   COLORURINE YELLOW  01/23/2019 1449   APPEARANCEUR CLEAR 01/23/2019 1449   LABSPEC >1.046 (H) 01/23/2019 1449   PHURINE 6.0 01/23/2019 1449   GLUCOSEU NEGATIVE 01/23/2019 1449   HGBUR SMALL (A) 01/23/2019 1449   HGBUR trace-lysed 12/24/2009 0858   BILIRUBINUR NEGATIVE 01/23/2019 1449   KETONESUR NEGATIVE 01/23/2019 1449   PROTEINUR NEGATIVE 01/23/2019 1449   UROBILINOGEN 1.0 01/31/2014 0959   NITRITE NEGATIVE 01/23/2019 1449   LEUKOCYTESUR NEGATIVE 01/23/2019 1449   Sepsis Labs Invalid input(s): PROCALCITONIN,  WBC,  LACTICIDVEN Microbiology Recent Results (from the past 240 hour(s))  SARS Coronavirus 2 [facility] order, Performed in [place] hospital lab)     Status: None   Collection Time: 01/23/19  3:13 PM  Result Value Ref Range Status   SARS Coronavirus 2 NEGATIVE NEGATIVE Final    Comment: (NOTE) If result is NEGATIVE SARS-CoV-2 target nucleic acids are NOT DETECTED. The SARS-CoV-2 RNA is generally detectable in upper and lower  respiratory specimens during the acute phase of infection. The lowest  concentration of SARS-CoV-2 viral copies this assay can detect is 250  copies / mL. A negative result does not preclude SARS-CoV-2 infection  and should not be used as the sole basis for treatment or other  patient management decisions.  A negative result may occur with  improper specimen collection / handling, submission of specimen other  than nasopharyngeal swab, presence of viral mutation(s) within the  areas targeted by this assay, and inadequate number of viral copies  (<250 copies / mL). A negative result must be combined with clinical  observations, patient history, and epidemiological information. If result is POSITIVE SARS-CoV-2 target nucleic acids are DETECTED. The SARS-CoV-2 RNA is generally detectable in upper and lower  respiratory specimens dur ing the acute phase of infection.  Positive  results are indicative of active infection with SARS-CoV-2.  Clinical  correlation with  patient history and other diagnostic information is  necessary to determine patient infection status.  Positive results do  not rule out bacterial infection or co-infection with other viruses. If result is PRESUMPTIVE POSTIVE SARS-CoV-2 nucleic acids MAY BE PRESENT.   A presumptive positive result was obtained on the submitted specimen  and confirmed on repeat testing.  While 2019 novel coronavirus  (SARS-CoV-2) nucleic acids may be present in the submitted sample  additional confirmatory testing may be necessary for epidemiological  and / or clinical management purposes  to differentiate between  SARS-CoV-2 and other Sarbecovirus currently known to infect humans.  If clinically indicated additional testing with an alternate test  methodology [phone])  is advised. The SARS-CoV-2 RNA is generally  detectable in upper and lower respiratory sp ecimens during the acute  phase of infection. The expected result is Negative. Fact Sheet for Patients:  StrictlyIdeas.no Fact Sheet for Healthcare Providers: BankingDealers.co.za This test is not yet approved or cleared by the [place] FDA and has been authorized for detection and/or diagnosis of SARS-CoV-2 by FDA under an Emergency Use Authorization (EUA).  This EUA will remain in effect (meaning this test can be used) for the duration of the COVID-19 declaration under Section 564(b)(1) of the Act, 21 U.S.C. section 360bbb-3(b)(1), unless the authorization is terminated or revoked sooner. Performed at [facility], [place] [address] [place], [place] [address]      Time coordinating discharge: 80mins  SIGNED:   Kathie Dike, MD  [facility] 01/25/2019, 1:27 PM   If 7PM-7AM, please contact night-coverage [email or web address]

## 2019-01-25 NOTE — Progress Notes (Signed)
Spoke with the patient and provided discharge instructions.  Patient verbalized understanding of discharge instructions.  Patient PIV was removed area CDI.    Discharge meds Allergies as of 01/25/2019      Reactions   Dilaudid [hydromorphone Hcl]    Nausea/ Vomiting  and  " head spinning sensation"       Medication List    STOP taking these medications   Linzess 72 MCG capsule Generic drug: linaclotide     TAKE these medications   ciprofloxacin 500 MG tablet Commonly known as: Cipro Take 1 tablet (500 mg total) by mouth 2 (two) times daily for 10 days.   metroNIDAZOLE 500 MG tablet Commonly known as: Flagyl Take 1 tablet (500 mg total) by mouth 3 (three) times daily for 10 days.   ondansetron 4 MG disintegrating tablet Commonly known as: Zofran ODT Take 1 tablet (4 mg total) by mouth every 8 (eight) hours as needed for nausea or vomiting.   oxyCODONE-acetaminophen 5-325 MG tablet Commonly known as: PERCOCET/ROXICET Take 1-2 tablets by mouth every 6 (six) hours as needed for moderate pain.       Patient wheel chaired down to awaiting vehicle.

## 2019-01-25 NOTE — Progress Notes (Signed)
Patient stated there was no one that needed an update at this time.

## 2019-02-02 ENCOUNTER — Telehealth: Payer: Self-pay

## 2019-02-02 NOTE — Telephone Encounter (Signed)
Transition Care Management Follow-up Telephone Call  Date of discharge and from where: [place] 07/02  How have you been since you were released from the hospital? [place]  Any questions or concerns? NO  Items Reviewed:  Did the pt receive and understand the discharge instructions provided? YES  Medications obtained and verified? YES  Any new allergies since your discharge? NO  Dietary orders reviewed? YES GAVE ER A LIST  Do you have support at home?YES  Other (ie: DME, Home Health, etc) NO  Functional Questionnaire: (I = Independent and D = Dependent) ADL's: I  Bathing/Dressing- I   Meal Prep- I  Eating- I  Maintaining continence- I  Transferring/Ambulation- I  Managing Meds- I   Follow up appointments reviewed:    PCP Hospital f/u appt confirmed? YES Scheduled to see Minette Brine FNP-BC on 02/06/19 @ 3:15PM  Specialist Hospital f/u appt confirmed? YES DR.HUNG ON 02/07/19 AT 10:00AM.  Are transportation arrangements needed?   If their condition worsens, is the pt aware to call  their PCP or go to the ED? YES  Was the patient provided with contact information for the PCP's office or ED? YES  Was the pt encouraged to call back with questions or concerns? YES

## 2019-02-06 ENCOUNTER — Other Ambulatory Visit: Payer: Self-pay

## 2019-02-06 ENCOUNTER — Telehealth: Payer: Self-pay

## 2019-02-06 ENCOUNTER — Encounter: Payer: BC Managed Care – PPO | Admitting: Nurse Practitioner

## 2019-02-06 NOTE — Telephone Encounter (Signed)
I called the pt to get her ready for her virtual visit.  The pt said that she just had an episode with her son at her home and then she became emotional and crying and I could barely understand what she was saying.  The pt said that she was sorry and that today isn't a good day.  I told the pt that I would call tomorrow to check on her.

## 2019-02-23 ENCOUNTER — Encounter: Payer: Self-pay | Admitting: Nurse Practitioner

## 2019-02-23 NOTE — Progress Notes (Signed)
Not seen

## 2019-03-28 ENCOUNTER — Telehealth: Payer: Self-pay

## 2019-03-28 NOTE — Telephone Encounter (Signed)
We received results from [facility] on patient's CT abdomen and I called her to schedule her an appointment because she needs an ultrasound. I have left a v/m to call the office so we can schedule an appointment. YRL,RMA

## 2019-05-22 ENCOUNTER — Emergency Department (HOSPITAL_COMMUNITY)
Admission: EM | Admit: 2019-05-22 | Discharge: 2019-05-22 | Disposition: A | Discharge status: Home / Self Care | Payer: BC Managed Care – PPO | Attending: Emergency Medicine | Admitting: Emergency Medicine

## 2019-05-22 ENCOUNTER — Encounter (HOSPITAL_COMMUNITY): Payer: Self-pay | Admitting: Emergency Medicine

## 2019-05-22 ENCOUNTER — Other Ambulatory Visit: Payer: Self-pay

## 2019-05-22 DIAGNOSIS — K0889 Other specified disorders of teeth and supporting structures: Secondary | ICD-10-CM | POA: Diagnosis not present

## 2019-05-22 DIAGNOSIS — R519 Headache, unspecified: Secondary | ICD-10-CM | POA: Insufficient documentation

## 2019-05-22 DIAGNOSIS — R070 Pain in throat: Secondary | ICD-10-CM | POA: Insufficient documentation

## 2019-05-22 DIAGNOSIS — R0981 Nasal congestion: Secondary | ICD-10-CM | POA: Diagnosis present

## 2019-05-22 DIAGNOSIS — J069 Acute upper respiratory infection, unspecified: Secondary | ICD-10-CM | POA: Diagnosis not present

## 2019-05-22 DIAGNOSIS — J329 Chronic sinusitis, unspecified: Secondary | ICD-10-CM | POA: Diagnosis not present

## 2019-05-22 DIAGNOSIS — Z20828 Contact with and (suspected) exposure to other viral communicable diseases: Secondary | ICD-10-CM | POA: Diagnosis not present

## 2019-05-22 MED ORDER — KETOROLAC TROMETHAMINE 10 MG PO TABS
10.0000 mg | ORAL_TABLET | Freq: Once | ORAL | Status: AC
  Administered 2019-05-22: 10 mg via ORAL
  Filled 2019-05-22: qty 1

## 2019-05-22 MED ORDER — AMOXICILLIN-POT CLAVULANATE 875-125 MG PO TABS: 1.0000 | ORAL_TABLET | Freq: Two times a day (BID) | ORAL | 0 refills | Status: DC

## 2019-05-22 NOTE — Discharge Instructions (Signed)
Recommended Tylenol Motrin as needed for pain control.  Recommend taking antibiotic for possible bacterial infection.  Please talk to your primary care doctor for recheck later this week.  Additionally recommend following up with your dentist.  If you develop difficulty breathing, chest pain, fevers, vomiting or other new concerns none recommend return to ER for reassessment.

## 2019-05-22 NOTE — ED Provider Notes (Signed)
[facility] DEPT Provider Note   CSN: [id number] Arrival date & time: 05/22/19  1413     History   Chief Complaint Chief Complaint  Patient presents with  . Headache  . Nasal Congestion    HPI Katie Woodard is a 53 y.o. female.  Presents to ER with chief complaint of nasal congestion, sinus pressure, HA, sore throat. States symptoms started a couple days ago, initially with nasal congestion, but has progressed to sinus pressure and HA.  States HA is currently dull, achy; not sudden onset, not worst headache of her life. No associated numbness, vision changes, weakness. She has not had fever, has had mild cough, nonproductive, nonbloody. No sick contacts.     Last week she also noted pain in her left lower molar. Has dentist.      HPI  Past Medical History:  Diagnosis Date  . DDD (degenerative disc disease), cervical   . Diverticulitis   . GERD (gastroesophageal reflux disease)    Takes OTC meds  . Headache   . Ovarian cyst   . Uterine fibroid     Patient Active Problem List   Diagnosis Date Noted  . Diverticulitis of sigmoid colon 01/23/2019  . GERD (gastroesophageal reflux disease)   . Hypokalemia   . Abdominal pain 01/31/2014  . Abdominal pain, chronic, right lower quadrant 01/31/2014  . OBESITY 11/11/2009  . ABSCESS, TOOTH 11/11/2009  . DIVERTICULITIS OF COLON 02/26/2009  . RECTAL BLEEDING 02/12/2009  . ABDOMINAL PAIN, CHRONIC 02/12/2009  . Microcytic anemia 11/13/2008  . MIGRAINE HEADACHE 11/13/2008  . HEMOCCULT POSITIVE STOOL 11/13/2008  . MICROSCOPIC HEMATURIA 11/13/2008  . VAGINITIS, BACTERIAL 11/13/2008  . GERD 10/18/2008    Past Surgical History:  Procedure Laterality Date  . CESAREAN SECTION  1988  . CHOLECYSTECTOMY  2005   [facility]  . LAPAROSCOPIC APPENDECTOMY N/A 01/31/2014   Procedure: APPENDECTOMY LAPAROSCOPIC;  Surgeon: Gwenyth Ober, MD;  Location: [facility] OR;  Service: General;  Laterality: N/A;     OB History    Gravida  4   Para  4   Term  4   Preterm      AB      Living        SAB      TAB      Ectopic      Multiple      Live Births               Home Medications    Prior to Admission medications   Medication Sig Start Date End Date Taking? Authorizing Provider  amoxicillin-clavulanate (AUGMENTIN) 875-125 MG tablet Take 1 tablet by mouth every 12 (twelve) hours. 05/22/19   Lucrezia Starch, MD  LINZESS 145 MCG CAPS capsule TAKE 1 CAPSULE BY MOUTH ONCE DAILY BEFORE THE FIRST MEAL FOR 30 DAYS 01/23/19   [provider]  ondansetron (ZOFRAN ODT) 4 MG disintegrating tablet Take 1 tablet (4 mg total) by mouth every 8 (eight) hours as needed for nausea or vomiting. 01/25/19   Kathie Dike, MD  oxyCODONE-acetaminophen (PERCOCET/ROXICET) 5-325 MG tablet Take 1-2 tablets by mouth every 6 (six) hours as needed for moderate pain. 01/25/19   Kathie Dike, MD    Family History Family History  Problem Relation Age of Onset  . Cancer Mother        breast  . Breast cancer Mother        75s and again in her 38s  . Cancer Sister  breast x2  . Breast cancer Sister        early 77s and again in late 86s  . Cancer Maternal Aunt        ovarian    Social History Social History   Tobacco Use  . Smoking status: Never Smoker  . Smokeless tobacco: Never Used  Substance Use Topics  . Alcohol use: No  . Drug use: No     Allergies   Dilaudid [hydromorphone hcl]   Review of Systems Review of Systems  Constitutional: Negative for chills and fever.  HENT: Positive for sinus pressure, sinus pain and sore throat. Negative for ear pain.   Eyes: Negative for pain and visual disturbance.  Respiratory: Negative for cough and shortness of breath.   Cardiovascular: Negative for chest pain and palpitations.  Gastrointestinal: Negative for abdominal pain and vomiting.  Genitourinary: Negative for dysuria and hematuria.  Musculoskeletal: Negative for arthralgias and  back pain.  Skin: Negative for color change and rash.  Neurological: Negative for seizures and syncope.  All other systems reviewed and are negative.    Physical Exam Updated Vital Signs BP (!) 161/102 (BP Location: Left Arm)   Pulse 72   Temp 98.7 F (37.1 C) (Oral)   Resp 18   SpO2 99%   Physical Exam Vitals signs and nursing note reviewed.  Constitutional:      General: She is not in acute distress.    Appearance: She is well-developed.  HENT:     Head: Normocephalic and atraumatic.     Mouth/Throat:     Comments: Clear posterior oropharynx, no erythema, no exudates Multiple dental caries noted in mouth, there is TTP over left lower molars, no swelling, no abscess, no drainage Eyes:     Extraocular Movements: Extraocular movements intact.     Conjunctiva/sclera: Conjunctivae normal.     Pupils: Pupils are equal, round, and reactive to light.  Neck:     Musculoskeletal: Neck supple.  Cardiovascular:     Rate and Rhythm: Normal rate and regular rhythm.     Heart sounds: No murmur.  Pulmonary:     Effort: Pulmonary effort is normal. No respiratory distress.     Breath sounds: Normal breath sounds.  Abdominal:     Palpations: Abdomen is soft.     Tenderness: There is no abdominal tenderness.  Skin:    General: Skin is warm and dry.  Neurological:     Mental Status: She is alert and oriented to person, place, and time.     GCS: GCS eye subscore is 4. GCS verbal subscore is 5. GCS motor subscore is 6.  Psychiatric:        Mood and Affect: Mood normal.        Behavior: Behavior normal.      ED Treatments / Results  Labs (all labs ordered are listed, but only abnormal results are displayed) Labs Reviewed  NOVEL CORONAVIRUS, NAA (HOSP ORDER, SEND-OUT TO REF LAB; TAT 18-24 HRS)    EKG None  Radiology No results found.  Procedures Procedures (including critical care time)  Medications Ordered in ED Medications  ketorolac (TORADOL) tablet 10 mg (10 mg  Oral Given 05/22/19 1633)     Initial Impression / Assessment and Plan / ED Course  I have reviewed the triage vital signs and the nursing notes.  Pertinent labs & imaging results that were available during my care of the patient were reviewed by me and considered in my medical decision making (see chart  for details).        53 y/o generally healthy lady presents with upper respiratory symptoms. She is well appearing in no distress. Her lungs are clear, no hypoxia, no tachypnea, cough was nonproductive; doubt pna, lower respiratory tract illness.  Will check for COVID-19 though no known exposures. She also noted dental pain recently. She has multiple dental caries, no signs of abscess on exam. Given possible sinusitis, dental pain, will cover with course of abx.  I recommended close recheck with her dentist as well as primary doctor. Reviewed return precautions, believe patient appropriate for discharge at this time.     After the discussed management above, the patient was determined to be safe for discharge.  The patient was in agreement with this plan and all questions regarding their care were answered.  ED return precautions were discussed and the patient will return to the ED with any significant worsening of condition.    Final Clinical Impressions(s) / ED Diagnoses   Final diagnoses:  Upper respiratory tract infection, unspecified type  Sinusitis, unspecified chronicity, unspecified location  Pain, dental    ED Discharge Orders         Ordered    amoxicillin-clavulanate (AUGMENTIN) 875-125 MG tablet  Every 12 hours     05/22/19 1528           Lucrezia Starch, MD 05/23/19 1011

## 2019-05-23 LAB — NOVEL CORONAVIRUS, NAA (HOSP ORDER, SEND-OUT TO REF LAB; TAT 18-24 HRS): SARS-CoV-2, NAA: NOT DETECTED

## 2019-07-12 ENCOUNTER — Encounter (HOSPITAL_COMMUNITY): Payer: Self-pay

## 2019-07-12 ENCOUNTER — Emergency Department (HOSPITAL_COMMUNITY): Payer: BC Managed Care – PPO

## 2019-07-12 ENCOUNTER — Emergency Department (HOSPITAL_COMMUNITY)
Admission: EM | Admit: 2019-07-12 | Discharge: 2019-07-12 | Disposition: A | Discharge status: Home / Self Care | Payer: BC Managed Care – PPO | Attending: Emergency Medicine | Admitting: Emergency Medicine

## 2019-07-12 ENCOUNTER — Other Ambulatory Visit: Payer: Self-pay

## 2019-07-12 ENCOUNTER — Ambulatory Visit (INDEPENDENT_AMBULATORY_CARE_PROVIDER_SITE_OTHER)
Admission: EM | Admit: 2019-07-12 | Discharge: 2019-07-12 | Disposition: A | Discharge status: Home / Self Care | Payer: BC Managed Care – PPO | Source: Home / Self Care

## 2019-07-12 DIAGNOSIS — R1032 Left lower quadrant pain: Secondary | ICD-10-CM | POA: Diagnosis present

## 2019-07-12 DIAGNOSIS — K5792 Diverticulitis of intestine, part unspecified, without perforation or abscess without bleeding: Secondary | ICD-10-CM | POA: Insufficient documentation

## 2019-07-12 DIAGNOSIS — R1084 Generalized abdominal pain: Secondary | ICD-10-CM | POA: Diagnosis not present

## 2019-07-12 LAB — CBC
HCT: 41.6 % (ref 36.0–46.0)
Hemoglobin: 12.5 g/dL (ref 12.0–15.0)
MCH: 21.2 pg — ABNORMAL LOW (ref 26.0–34.0)
MCHC: 30 g/dL (ref 30.0–36.0)
MCV: 70.5 fL — ABNORMAL LOW (ref 80.0–100.0)
Platelets: 345 10*3/uL (ref 150–400)
RBC: 5.9 MIL/uL — ABNORMAL HIGH (ref 3.87–5.11)
RDW: 18 % — ABNORMAL HIGH (ref 11.5–15.5)
WBC: 10.8 10*3/uL — ABNORMAL HIGH (ref 4.0–10.5)
nRBC: 0 % (ref 0.0–0.2)

## 2019-07-12 LAB — I-STAT BETA HCG BLOOD, ED (MC, WL, AP ONLY): I-stat hCG, quantitative: <5 m[IU]/mL (ref <5)

## 2019-07-12 LAB — COMPREHENSIVE METABOLIC PANEL
ALT: 13 U/L (ref 0–44)
AST: 15 U/L (ref 15–41)
Albumin: 4 g/dL (ref 3.5–5.0)
Alkaline Phosphatase: 69 U/L (ref 38–126)
Anion gap: 11 (ref 5–15)
BUN: 6 mg/dL (ref 6–20)
CO2: 23 mmol/L (ref 22–32)
Calcium: 9.3 mg/dL (ref 8.9–10.3)
Chloride: 105 mmol/L (ref 98–111)
Creatinine, Ser: 0.5 mg/dL (ref 0.44–1.00)
GFR calc Af Amer: >60 mL/min (ref >60)
GFR calc non Af Amer: >60 mL/min (ref >60)
Glucose, Bld: 97 mg/dL (ref 70–99)
Potassium: 4 mmol/L (ref 3.5–5.1)
Sodium: 139 mmol/L (ref 135–145)
Total Bilirubin: 0.5 mg/dL (ref 0.3–1.2)
Total Protein: 7.9 g/dL (ref 6.5–8.1)

## 2019-07-12 LAB — URINALYSIS, ROUTINE W REFLEX MICROSCOPIC
Bilirubin Urine: NEGATIVE
Glucose, UA: NEGATIVE mg/dL
Ketones, ur: 20 mg/dL — AB
Leukocytes,Ua: NEGATIVE
Nitrite: NEGATIVE
Protein, ur: NEGATIVE mg/dL
Specific Gravity, Urine: 1.013 (ref 1.005–1.030)
pH: 7 (ref 5.0–8.0)

## 2019-07-12 LAB — LIPASE, BLOOD: Lipase: 22 U/L (ref 11–51)

## 2019-07-12 LAB — LACTIC ACID, PLASMA: Lactic Acid, Venous: 1 mmol/L (ref 0.5–1.9)

## 2019-07-12 MED ORDER — HYDROCODONE-ACETAMINOPHEN 5-325 MG PO TABS
2.0000 | ORAL_TABLET | Freq: Once | ORAL | Status: AC
  Administered 2019-07-12: 2 via ORAL
  Filled 2019-07-12: qty 2

## 2019-07-12 MED ORDER — HYDROCODONE-ACETAMINOPHEN 5-325 MG PO TABS: 1.0000 | ORAL_TABLET | Freq: Four times a day (QID) | ORAL | 0 refills | Status: DC | PRN

## 2019-07-12 MED ORDER — ONDANSETRON 4 MG PO TBDP: 4.0000 mg | ORAL_TABLET | Freq: Three times a day (TID) | ORAL | 0 refills | Status: DC | PRN

## 2019-07-12 MED ORDER — IOHEXOL 300 MG/ML  SOLN
100.0000 mL | Freq: Once | INTRAMUSCULAR | Status: AC | PRN
  Administered 2019-07-12: 100 mL via INTRAVENOUS

## 2019-07-12 MED ORDER — AMOXICILLIN-POT CLAVULANATE 875-125 MG PO TABS
1.0000 | ORAL_TABLET | Freq: Once | ORAL | Status: AC
  Administered 2019-07-12: 1 via ORAL
  Filled 2019-07-12: qty 1

## 2019-07-12 MED ORDER — METRONIDAZOLE IN NACL 5-0.79 MG/ML-% IV SOLN
500.0000 mg | Freq: Once | INTRAVENOUS | Status: AC
  Administered 2019-07-12: 500 mg via INTRAVENOUS
  Filled 2019-07-12: qty 100

## 2019-07-12 MED ORDER — SODIUM CHLORIDE 0.9% FLUSH: 3.0000 mL | Freq: Once | INTRAVENOUS | Status: DC

## 2019-07-12 MED ORDER — SODIUM CHLORIDE 0.9 % IV SOLN
2.0000 g | Freq: Once | INTRAVENOUS | Status: AC
  Administered 2019-07-12: 2 g via INTRAVENOUS
  Filled 2019-07-12: qty 20

## 2019-07-12 MED ORDER — SODIUM CHLORIDE 0.9 % IV BOLUS
1000.0000 mL | Freq: Once | INTRAVENOUS | Status: AC
  Administered 2019-07-12: 1000 mL via INTRAVENOUS

## 2019-07-12 MED ORDER — AMOXICILLIN-POT CLAVULANATE 875-125 MG PO TABS: 1.0000 | ORAL_TABLET | Freq: Two times a day (BID) | ORAL | 0 refills | Status: AC

## 2019-07-12 MED ORDER — MORPHINE SULFATE (PF) 4 MG/ML IV SOLN
4.0000 mg | Freq: Once | INTRAVENOUS | Status: AC
  Administered 2019-07-12: 4 mg via INTRAVENOUS
  Filled 2019-07-12: qty 1

## 2019-07-12 NOTE — ED Notes (Signed)
Patient transported to CT 

## 2019-07-12 NOTE — ED Notes (Signed)
Patient is being discharged from the [facility] and sent to the Emergency Department via wheelchair by staff. Per Traci, patient is stable but in need of higher level of care due to acute abdominal pain and possible diverticulitis flare. Patient is aware and verbalizes understanding of plan of care.  Vitals:   07/12/19 0841  BP: (!) 155/89  Pulse: 84  Resp: 20  Temp: 100 F (37.8 C)  SpO2: 98%

## 2019-07-12 NOTE — ED Provider Notes (Signed)
[facility]    CSN: [id number] Arrival date & time: 07/12/19  0830      History   Chief Complaint Chief Complaint  Patient presents with  . Abdominal Pain    HPI Katie Woodard is a 53 y.o. female.   Patient is a 53 year old female with past medical history of generative disc disease, diverticulitis, GERD, headache, ovarian cyst, uterine fibroid.  She presents today with abdominal discomfort x4 days.  Symptoms have worsened.  History of diverticulitis with abscess formation.  This will similar to that.  Thought she was constipated and took a Linzess but did not get much relief from that.  Tender throughout entire abdomen.  Fever here of 100 today.  Took half a Percocet for pain and leftover antibiotics from previous flare.  ROS per HPI    Abdominal Pain   Past Medical History:  Diagnosis Date  . DDD (degenerative disc disease), cervical   . Diverticulitis   . GERD (gastroesophageal reflux disease)    Takes OTC meds  . Headache   . Ovarian cyst   . Uterine fibroid     Patient Active Problem List   Diagnosis Date Noted  . Diverticulitis of sigmoid colon 01/23/2019  . GERD (gastroesophageal reflux disease)   . Hypokalemia   . Abdominal pain 01/31/2014  . Abdominal pain, chronic, right lower quadrant 01/31/2014  . OBESITY 11/11/2009  . ABSCESS, TOOTH 11/11/2009  . DIVERTICULITIS OF COLON 02/26/2009  . RECTAL BLEEDING 02/12/2009  . ABDOMINAL PAIN, CHRONIC 02/12/2009  . Microcytic anemia 11/13/2008  . MIGRAINE HEADACHE 11/13/2008  . HEMOCCULT POSITIVE STOOL 11/13/2008  . MICROSCOPIC HEMATURIA 11/13/2008  . VAGINITIS, BACTERIAL 11/13/2008  . GERD 10/18/2008    Past Surgical History:  Procedure Laterality Date  . CESAREAN SECTION  1988  . CHOLECYSTECTOMY  2005   [facility]  . LAPAROSCOPIC APPENDECTOMY N/A 01/31/2014   Procedure: APPENDECTOMY LAPAROSCOPIC;  Surgeon: Gwenyth Ober, MD;  Location: [facility] OR;  Service: General;  Laterality: N/A;    OB History      Gravida  4   Para  4   Term  4   Preterm      AB      Living        SAB      TAB      Ectopic      Multiple      Live Births               Home Medications    Prior to Admission medications   Medication Sig Start Date End Date Taking? Authorizing Provider  amoxicillin-clavulanate (AUGMENTIN) 875-125 MG tablet Take 1 tablet by mouth every 12 (twelve) hours. 05/22/19   Lucrezia Starch, MD  LINZESS 145 MCG CAPS capsule TAKE 1 CAPSULE BY MOUTH ONCE DAILY BEFORE THE FIRST MEAL FOR 30 DAYS 01/23/19   [provider]  ondansetron (ZOFRAN ODT) 4 MG disintegrating tablet Take 1 tablet (4 mg total) by mouth every 8 (eight) hours as needed for nausea or vomiting. 01/25/19   Kathie Dike, MD  oxyCODONE-acetaminophen (PERCOCET/ROXICET) 5-325 MG tablet Take 1-2 tablets by mouth every 6 (six) hours as needed for moderate pain. 01/25/19   Kathie Dike, MD    Family History Family History  Problem Relation Age of Onset  . Cancer Mother        breast  . Breast cancer Mother        46s and again in her 18s  . Cancer Sister  breast x2  . Breast cancer Sister        early 48s and again in late 86s  . Cancer Maternal Aunt        ovarian    Social History Social History   Tobacco Use  . Smoking status: Never Smoker  . Smokeless tobacco: Never Used  Substance Use Topics  . Alcohol use: No  . Drug use: No     Allergies   Dilaudid [hydromorphone hcl]   Review of Systems Review of Systems  Gastrointestinal: Positive for abdominal pain.     Physical Exam Triage Vital Signs ED Triage Vitals  Enc Vitals Group     BP 07/12/19 0841 (!) 155/89     Pulse Rate 07/12/19 0841 84     Resp 07/12/19 0841 20     Temp 07/12/19 0841 100 F (37.8 C)     Temp Source 07/12/19 0841 Oral     SpO2 07/12/19 0841 98 %     Weight --      Height --      Head Circumference --      Peak Flow --      Pain Score 07/12/19 0840 10     Pain Loc --      Pain Edu?  --      Excl. in [place]? --    No data found.  Updated Vital Signs BP (!) 155/89 (BP Location: Right Arm)   Pulse 84   Temp 100 F (37.8 C) (Oral)   Resp 20   SpO2 98%   Visual Acuity Right Eye Distance:   Left Eye Distance:   Bilateral Distance:    Right Eye Near:   Left Eye Near:    Bilateral Near:     Physical Exam Vitals and nursing note reviewed.  Constitutional:      General: She is not in acute distress.    Appearance: She is well-developed. She is obese. She is not ill-appearing, toxic-appearing or diaphoretic.     Comments: Appears in pain   HENT:     Head: Normocephalic and atraumatic.  Abdominal:     General: Bowel sounds are decreased.     Palpations: Abdomen is soft.     Tenderness: There is generalized abdominal tenderness.     Comments: Very tender to palpation throughout entire abdomen.  Skin:    General: Skin is warm and dry.  Neurological:     Mental Status: She is alert.  Psychiatric:        Mood and Affect: Mood normal.      UC Treatments / Results  Labs (all labs ordered are listed, but only abnormal results are displayed) Labs Reviewed - No data to display  EKG   Radiology No results found.  Procedures Procedures (including critical care time)  Medications Ordered in UC Medications - No data to display  Initial Impression / Assessment and Plan / UC Course  I have reviewed the triage vital signs and the nursing notes.  Pertinent labs & imaging results that were available during my care of the patient were reviewed by me and considered in my medical decision making (see chart for details).     Abdominal pain-sending patient to the ER for CT scan due to possible diverticulitis flare and possible abscess formation Patient very tender to palpation of entire abdomen.  Fever here today. Final Clinical Impressions(s) / UC Diagnoses   Final diagnoses:  Generalized abdominal pain     Discharge Instructions  Go to the ER for  further evaluation and management    ED Prescriptions    None     PDMP not reviewed this encounter.   Orvan July, NP 07/12/19 [phone]

## 2019-07-12 NOTE — Discharge Instructions (Addendum)
Go to the ER for further evaluation and management. °

## 2019-07-12 NOTE — ED Triage Notes (Signed)
Pt presents to the UC with abdominal pain x 4 days. " I think I have diverticulitis flare up"

## 2019-07-12 NOTE — ED Notes (Signed)
Patient verbalizes understanding of discharge instructions. Opportunity for questioning and answers were provided. Armband removed by staff, pt discharged from ED ambulatory.   

## 2019-07-12 NOTE — ED Provider Notes (Signed)
[facility] Provider Note   CSN: [id number] Arrival date & time: 07/12/19  [id number]     History Chief Complaint  Patient presents with  . Abdominal Pain    Katie Woodard is a 53 y.o. female history GERD, diverticulitis, ovarian cysts, fibroids, DDD, obesity, appendectomy, cholecystectomy.  Patient presents today from urgent care for concern of diverticulitis.  She reports that over the past 2 days she has developed severe left lower quadrant pain.  She describes a constant severe burning sharp pain radiating throughout her entire abdomen no clear alleviating factors, worsened with movement and palpation.  She reports chills and feeling warm at home but has not measured fever prior to today.  She denies any headache, neck pain, chest pain/shortness of breath, cough/masses, extremity swelling/color change, fall/injury, dysuria/hematuria, diarrhea, melena, blood in the stool, vaginal eating/discharge or any additional concerns.  She denies any medications prior to arrival for symptoms.  HPI     Past Medical History:  Diagnosis Date  . DDD (degenerative disc disease), cervical   . Diverticulitis   . GERD (gastroesophageal reflux disease)    Takes OTC meds  . Headache   . Ovarian cyst   . Uterine fibroid     Patient Active Problem List   Diagnosis Date Noted  . Diverticulitis of sigmoid colon 01/23/2019  . GERD (gastroesophageal reflux disease)   . Hypokalemia   . Abdominal pain 01/31/2014  . Abdominal pain, chronic, right lower quadrant 01/31/2014  . OBESITY 11/11/2009  . ABSCESS, TOOTH 11/11/2009  . DIVERTICULITIS OF COLON 02/26/2009  . RECTAL BLEEDING 02/12/2009  . ABDOMINAL PAIN, CHRONIC 02/12/2009  . Microcytic anemia 11/13/2008  . MIGRAINE HEADACHE 11/13/2008  . HEMOCCULT POSITIVE STOOL 11/13/2008  . MICROSCOPIC HEMATURIA 11/13/2008  . VAGINITIS, BACTERIAL 11/13/2008  . GERD 10/18/2008    Past Surgical History:  Procedure  Laterality Date  . CESAREAN SECTION  1988  . CHOLECYSTECTOMY  2005   [facility]  . LAPAROSCOPIC APPENDECTOMY N/A 01/31/2014   Procedure: APPENDECTOMY LAPAROSCOPIC;  Surgeon: Gwenyth Ober, MD;  Location: [facility] OR;  Service: General;  Laterality: N/A;     OB History    Gravida  4   Para  4   Term  4   Preterm      AB      Living        SAB      TAB      Ectopic      Multiple      Live Births              Family History  Problem Relation Age of Onset  . Cancer Mother        breast  . Breast cancer Mother        73s and again in her 26s  . Cancer Sister        breast x2  . Breast cancer Sister        early 84s and again in late 44s  . Cancer Maternal Aunt        ovarian    Social History   Tobacco Use  . Smoking status: Never Smoker  . Smokeless tobacco: Never Used  Substance Use Topics  . Alcohol use: No  . Drug use: No    Home Medications Prior to Admission medications   Medication Sig Start Date End Date Taking? Authorizing Provider  acetaminophen (TYLENOL) 650 MG CR tablet Take 1,300 mg by mouth daily.  Yes [provider]  LINZESS 145 MCG CAPS capsule Take 145 mcg by mouth every evening.  01/23/19  Yes [provider]  amoxicillin-clavulanate (AUGMENTIN) 875-125 MG tablet Take 1 tablet by mouth every 12 (twelve) hours for 10 days. 07/12/19 07/22/19  Nuala Alpha A, PA-C  HYDROcodone-acetaminophen (NORCO/VICODIN) 5-325 MG tablet Take 1-2 tablets by mouth every 6 (six) hours as needed. 07/12/19   Nuala Alpha A, PA-C  ondansetron (ZOFRAN ODT) 4 MG disintegrating tablet Take 1 tablet (4 mg total) by mouth every 8 (eight) hours as needed for nausea or vomiting. 07/12/19   Deliah Boston, PA-C    Allergies    Dilaudid [hydromorphone hcl]  Review of Systems   Review of Systems Ten systems are reviewed and are negative for acute change except as noted in the HPI  Physical Exam Updated Vital Signs BP (!) 144/82   Pulse 81    Temp (!) 100.6 F (38.1 C) (Oral)   Resp 18   SpO2 99%   Physical Exam Constitutional:      General: She is not in acute distress.    Appearance: Normal appearance. She is well-developed. She is not ill-appearing or diaphoretic.  HENT:     Head: Normocephalic and atraumatic.     Right Ear: External ear normal.     Left Ear: External ear normal.     Nose: Nose normal.  Eyes:     General: Vision grossly intact. Gaze aligned appropriately.     Pupils: Pupils are equal, round, and reactive to light.  Neck:     Trachea: Trachea and phonation normal. No tracheal deviation.  Cardiovascular:     Rate and Rhythm: Normal rate and regular rhythm.  Pulmonary:     Effort: Pulmonary effort is normal. No respiratory distress.  Abdominal:     General: There is no distension.     Palpations: Abdomen is soft.     Tenderness: There is generalized abdominal tenderness. There is guarding. There is no rebound.  Genitourinary:    Comments: Deferred by patient Musculoskeletal:        General: Normal range of motion.     Cervical back: Normal range of motion.  Skin:    General: Skin is warm and dry.  Neurological:     Mental Status: She is alert.     GCS: GCS eye subscore is 4. GCS verbal subscore is 5. GCS motor subscore is 6.     Comments: Speech is clear and goal oriented, follows commands Major Cranial nerves without deficit, no facial droop Moves extremities without ataxia, coordination intact  Psychiatric:        Behavior: Behavior normal.     ED Results / Procedures / Treatments   Labs (all labs ordered are listed, but only abnormal results are displayed) Labs Reviewed  CBC - Abnormal; Notable for the following components:      Result Value   WBC 10.8 (*)    RBC 5.90 (*)    MCV 70.5 (*)    MCH 21.2 (*)    RDW 18.0 (*)    All other components within normal limits  URINALYSIS, ROUTINE W REFLEX MICROSCOPIC - Abnormal; Notable for the following components:   APPearance HAZY (*)      Hgb urine dipstick SMALL (*)    Ketones, ur 20 (*)    Bacteria, UA RARE (*)    All other components within normal limits  CULTURE, BLOOD (ROUTINE X 2)  CULTURE, BLOOD (ROUTINE X 2)  LIPASE, BLOOD  COMPREHENSIVE METABOLIC PANEL  LACTIC ACID, PLASMA  LACTIC ACID, PLASMA  I-STAT BETA HCG BLOOD, ED ([facility], [facility], [facility] ONLY)    EKG None  Radiology CT ABDOMEN PELVIS W CONTRAST  Result Date: 07/12/2019 CLINICAL DATA:  Abdominal pain for 4 days. Concern for a flare up of diverticulitis. EXAM: CT ABDOMEN AND PELVIS WITH CONTRAST TECHNIQUE: Multidetector CT imaging of the abdomen and pelvis was performed using the standard protocol following bolus administration of intravenous contrast. CONTRAST:  177mL OMNIPAQUE IOHEXOL 300 MG/ML  SOLN COMPARISON:  01/23/2019 FINDINGS: Lower chest: Clear lung bases.  Heart borderline enlarged. Hepatobiliary: No focal liver abnormality is seen. Status post cholecystectomy. No biliary dilatation. Pancreas: Unremarkable. No pancreatic ductal dilatation or surrounding inflammatory changes. Spleen: Normal in size without focal abnormality. Adrenals/Urinary Tract: No adrenal masses. Kidneys are normal in size, orientation and position with symmetric enhancement and excretion. Subcentimeter low-density renal masses, largest measuring 8-9 mm, anterior midpole of the left kidney, all consistent with cysts and stable from the prior CT. No stones. No hydronephrosis. Normal ureters. Bladder is unremarkable. Stomach/Bowel: Wall thickening and adjacent inflammation is noted along the proximal to mid sigmoid colon, with there are multiple diverticula, consistent mild uncomplicated diverticulitis. No evidence of an abscess. No extraluminal or free air. There are additional diverticula elsewhere along the colon. No other inflammation. Small bowel and stomach are unremarkable. Appendix is surgically absent. Vascular/Lymphatic: No significant vascular findings are present. No enlarged abdominal  or pelvic lymph nodes. Reproductive: Uterus normal in size. Prominent periuterine vessels. No masses. Ovaries and adnexa are unremarkable. Other: No abdominal wall hernia.  No ascites. Musculoskeletal: No fracture or acute finding. No osteoblastic or osteolytic lesions. IMPRESSION: 1. Mild uncomplicated sigmoid diverticulitis. No abscess or extraluminal air. 2. No other acute findings within the abdomen or pelvis. Electronically Signed   By: Lajean Manes M.D.   On: 07/12/2019 14:07   DG Chest Portable 1 View  Result Date: 07/12/2019 CLINICAL DATA:  Abdominal pain EXAM: PORTABLE CHEST 1 VIEW COMPARISON:  2018. FINDINGS: The heart size and mediastinal contours are within normal limits. Both lungs are clear. No pleural effusion or pneumothorax. The visualized skeletal structures are unremarkable. IMPRESSION: No acute process in the chest. Electronically Signed   By: Macy Mis M.D.   On: 07/12/2019 13:21    Procedures Procedures (including critical care time)  Medications Ordered in ED Medications  sodium chloride flush (NS) 0.9 % injection 3 mL (has no administration in time range)  morphine 4 MG/ML injection 4 mg (4 mg Intravenous Given 07/12/19 1316)  sodium chloride 0.9 % bolus 1,000 mL (0 mLs Intravenous Stopped 07/12/19 1523)  cefTRIAXone (ROCEPHIN) 2 g in sodium chloride 0.9 % 100 mL IVPB (0 g Intravenous Stopped 07/12/19 1400)    And  metroNIDAZOLE (FLAGYL) IVPB 500 mg (0 mg Intravenous Stopped 07/12/19 1435)  iohexol (OMNIPAQUE) 300 MG/ML solution 100 mL (100 mLs Intravenous Contrast Given 07/12/19 1348)  HYDROcodone-acetaminophen (NORCO/VICODIN) 5-325 MG per tablet 2 tablet (2 tablets Oral Given 07/12/19 1450)  amoxicillin-clavulanate (AUGMENTIN) 875-125 MG per tablet 1 tablet (1 tablet Oral Given 07/12/19 1450)    ED Course  I have reviewed the triage vital signs and the nursing notes.  Pertinent labs & imaging results that were available during my care of the patient were  reviewed by me and considered in my medical decision making (see chart for details).    MDM Rules/Calculators/A&P  CBC leukocytosis of 10.8 CMP within normal limits Lipase within normal limits Beta-hCG negative Urinalysis pending - On initial evaluation patient is uncomfortable appearing she has generalized abdominal tenderness, worse in the left lower quadrant, guarding voluntarily, no rebound.  Cranial nerves intact, heart regular rate and rhythm, lungs clear, NVI x4.  She has low-grade fever here, no tachycardia, tachypnea and leukocytosis is below 12.0.  She does not currently meet SIRS/sepsis criteria.  There is concern for diverticulitis on examination, will obtain CT abdomen/pelvis, blood cultures, lactic and give fluid bolus, pain control and first dose of IV antibiotics for suspected intra-abdominal etiology. - Urinalysis appears contaminated, patient without urinary symptoms, doubt UTI at this time  CXR:  IMPRESSION:  No acute process in the chest.   CT abdomen pelvis:  IMPRESSION:  1. Mild uncomplicated sigmoid diverticulitis. No abscess or  extraluminal air.  2. No other acute findings within the abdomen or pelvis.   - Patient has received 1 L IV fluid, 500 mg Flagyl, 2 g Rocephin, 4 mg of morphine.  She is reevaluated resting comfortably, sleeping easily arousable to voice.  Reports she is feeling greatly improved, I advised her of findings as above and she states understanding.  She asks to attempt outpatient treatment for her uncomplicated diverticulitis which I feel is reasonable at this time.  Vital signs stable on reevaluation.  Will give Norco and Augmentin p.o. challenge, plan for discharge with outpatient follow-up. - 3:30 PM: Patient reassessed resting comfortably no acute distress has eaten and drank and reports that she is feeling well and like to go home.  Will discharge with Norco, Zofran and Augmentin.  Advised patient of precautions  regarding narcotics and she states understanding, she is a family member driving her home today.  PMD P reviewed patient's last narcotic prescription on file was July 2020, 15 pills Percocet. We will also give GI referral to establish follow-up care.   At this time there does not appear to be any evidence of an acute emergency medical condition and the patient appears stable for discharge with appropriate outpatient follow up. Diagnosis was discussed with patient who verbalizes understanding of care plan and is agreeable to discharge. I have discussed return precautions with patient who verbalizes understanding of return precautions. Patient encouraged to follow-up with their PCP and GI. All questions answered.  Patient's case discussed with Dr. Ronnald Nian who agrees with plan to discharge with follow-up.  Katie Woodard was evaluated in Emergency Department on 07/12/2019 for the symptoms described in the history of present illness. She was evaluated in the context of the global COVID-19 pandemic, which necessitated consideration that the patient might be at risk for infection with the SARS-CoV-2 virus that causes COVID-19. Institutional protocols and algorithms that pertain to the evaluation of patients at risk for COVID-19 are in a state of rapid change based on information released by regulatory bodies including the CDC and federal and state organizations. These policies and algorithms were followed during the patient's care in the ED.   Note: Portions of this report may have been transcribed using voice recognition software. Every effort was made to ensure accuracy; however, inadvertent computerized transcription errors may still be present. Final Clinical Impression(s) / ED Diagnoses Final diagnoses:  Diverticulitis    Rx / DC Orders ED Discharge Orders         Ordered    amoxicillin-clavulanate (AUGMENTIN) 875-125 MG tablet  Every 12 hours     07/12/19 1538    ondansetron (ZOFRAN ODT)  4 MG  disintegrating tablet  Every 8 hours PRN     07/12/19 1538    HYDROcodone-acetaminophen (NORCO/VICODIN) 5-325 MG tablet  Every 6 hours PRN     07/12/19 1538           Deliah Boston, PA-C 07/12/19 1539    Lennice Sites, DO 07/12/19 1552

## 2019-07-12 NOTE — Discharge Instructions (Signed)
You have been diagnosed today with Diverticulitis.  At this time there does not appear to be the presence of an emergent medical condition, however there is always the potential for conditions to change. Please read and follow the below instructions.  Please return to the Emergency Department immediately for any new or worsening symptoms or if your symptoms do not improve in the next 1-2 days. Please be sure to follow up with your Primary Care Provider within one week regarding your visit today; please call their office to schedule an appointment even if you are feeling better for a follow-up visit. Please take the antibiotic Augmentin as prescribed, 2 pills daily for the next 10 days.  You may use the nausea medication Zofran as prescribed to help with nausea and vomiting.  You may use the pain medication Norco as prescribed for severe pain, do not drive or perform dangerous activities while taking Norco as it may make you drowsy, do not drink alcohol while taking Norco as this will worsen side effects. Please drink plenty of water and get plenty of rest.  You may call the gastroenterologist at [facility] to establish GI care if you do not already have one.  Get help right away if: Your pain gets worse. Your problems do not get better. Your problems get worse very fast. You have a fever. You throw up (vomit) more than one time. You have poop that is: Bloody. Black. Tarry. You have any new/concerning or worsening of symptoms  Please read the additional information packets attached to your discharge summary.  Do not take your medicine if  develop an itchy rash, swelling in your mouth or lips, or difficulty breathing; call 911 and seek immediate emergency medical attention if this occurs.  Note: Portions of this text may have been transcribed using voice recognition software. Every effort was made to ensure accuracy; however, inadvertent computerized transcription errors may still be present.

## 2019-07-12 NOTE — ED Triage Notes (Signed)
Pt to ER for evaluation of lower abdominal pain onset 2 days ago. Hx of diverticulitis with abscess 6 months ago requiring admission. She reports chills and body aches. Denies nausea and vomiting, reports constipation. She reports sent here by [facility] for further workup.

## 2019-07-16 ENCOUNTER — Telehealth: Payer: Self-pay

## 2019-07-16 NOTE — Telephone Encounter (Signed)
I left pt v/m to call the office to schedule a ER f/u. YRL,RMA

## 2019-07-17 LAB — CULTURE, BLOOD (ROUTINE X 2)
Culture: NO GROWTH
Culture: NO GROWTH
Special Requests: ADEQUATE

## 2019-07-24 ENCOUNTER — Ambulatory Visit: Payer: BC Managed Care – PPO | Admitting: Nurse Practitioner

## 2019-07-24 ENCOUNTER — Encounter: Payer: Self-pay | Admitting: Nurse Practitioner

## 2019-07-24 ENCOUNTER — Other Ambulatory Visit: Payer: Self-pay

## 2019-07-24 VITALS — BP 130/88 | HR 86 | Temp 98.6°F | Ht 59.2 in | Wt 198.6 lb

## 2019-07-24 DIAGNOSIS — K5732 Diverticulitis of large intestine without perforation or abscess without bleeding: Secondary | ICD-10-CM

## 2019-07-24 DIAGNOSIS — R1084 Generalized abdominal pain: Secondary | ICD-10-CM

## 2019-07-24 DIAGNOSIS — N760 Acute vaginitis: Secondary | ICD-10-CM | POA: Diagnosis not present

## 2019-07-24 MED ORDER — FLUCONAZOLE 100 MG PO TABS: ORAL_TABLET | ORAL | 0 refills | Status: DC

## 2019-07-24 NOTE — Progress Notes (Signed)
This visit occurred during the SARS-CoV-2 public health emergency.  Safety protocols were in place, including screening questions prior to the visit, additional usage of staff PPE, and extensive cleaning of exam room while observing appropriate contact time as indicated for disinfecting solutions.  Subjective:     Patient ID: Katie Woodard , female    DOB: Oct 31, 1965 , 53 y.o.   MRN: [id number]   Chief Complaint  Patient presents with  . ER F/U    she stated she has been feeling sore and still having some nausea and vomiting     HPI  She has been to ER two times in the last 2 months once for abdominal pain and once for headache.  She is followed by Dr. Benson Norway - she is to have a colonoscopy but could not have done due to being in the hospital.  She also has a bad tooth.    Yesterday she was doing clear fluids due to feeling like she was having a flare. She is also a little sore all over.  She is also having vaginal itching.  She has 6 more augmentin to take.  She does not have a gynecologist.    She has not been seen in the office in over 1 year.     Past Medical History:  Diagnosis Date  . DDD (degenerative disc disease), cervical   . Diverticulitis   . GERD (gastroesophageal reflux disease)    Takes OTC meds  . Headache   . Ovarian cyst   . Uterine fibroid      Family History  Problem Relation Age of Onset  . Cancer Mother        breast  . Breast cancer Mother        70s and again in her 17s  . Cancer Sister        breast x2  . Breast cancer Sister        early 95s and again in late 83s  . Cancer Maternal Aunt        ovarian     Current Outpatient Medications:  .  acetaminophen (TYLENOL) 650 MG CR tablet, Take 1,300 mg by mouth daily., Disp: , Rfl:  .  LINZESS 145 MCG CAPS capsule, Take 145 mcg by mouth every evening. , Disp: , Rfl:  .  ondansetron (ZOFRAN ODT) 4 MG disintegrating tablet, Take 1 tablet (4 mg total) by mouth every 8 (eight) hours as needed for  nausea or vomiting., Disp: 9 tablet, Rfl: 0   Allergies  Allergen Reactions  . Dilaudid [Hydromorphone Hcl]     Nausea/ Vomiting  and  " head spinning sensation"      Review of Systems  Constitutional: Negative.   Respiratory: Negative.   Cardiovascular: Negative.  Negative for chest pain, palpitations and leg swelling.  Gastrointestinal: Positive for abdominal distention. Negative for constipation, diarrhea and nausea.  Endocrine: Negative for polydipsia, polyphagia and polyuria.  Musculoskeletal: Negative.   Skin: Negative.   Neurological: Negative for dizziness and headaches.  Psychiatric/Behavioral: Negative.      Today's Vitals   07/24/19 1005  BP: 130/88  Pulse: 86  Temp: 98.6 F (37 C)  TempSrc: Oral  Weight: 198 lb 9.6 oz (90.1 kg)  Height: 4' 11.2" (1.504 m)  PainSc: 6   PainLoc: Abdomen   Body mass index is 39.84 kg/m.   Objective:  Physical Exam Constitutional:      General: She is not in acute distress.    Appearance: Normal  appearance.  Cardiovascular:     Rate and Rhythm: Normal rate and regular rhythm.     Pulses: Normal pulses.     Heart sounds: Normal heart sounds. No murmur.  Pulmonary:     Effort: Pulmonary effort is normal. No respiratory distress.     Breath sounds: Normal breath sounds.  Abdominal:     General: Bowel sounds are normal. There is no distension.     Palpations: Abdomen is soft.     Tenderness: There is no abdominal tenderness (generalized).  Skin:    General: Skin is warm.     Capillary Refill: Capillary refill takes less than 2 seconds.  Neurological:     General: No focal deficit present.     Mental Status: She is alert and oriented to person, place, and time.  Psychiatric:        Mood and Affect: Mood normal.        Behavior: Behavior normal.        Thought Content: Thought content normal.        Judgment: Judgment normal.         Assessment And Plan:     1. Diverticulitis of sigmoid colon  She is to follow  up with GI to discuss further about a colonoscopy  Continue to avoid foods that trigger and stress  2. Acute vaginitis  Likely related to antibiotic therapy - fluconazole (DIFLUCAN) 100 MG tablet; Take one tablet by mouth now then repeat in 5 days  Dispense: 2 tablet; Refill: 0  3. Generalized abdominal pain  Likely related to diverticular disease  Mild tenderness on palpation - US Pelvis Complete; Future  She will try to get a copy of her tetanus she feels she may have had 4 years ago.    Minette Brine, FNP    THE PATIENT IS ENCOURAGED TO PRACTICE SOCIAL DISTANCING DUE TO THE COVID-19 PANDEMIC.

## 2019-08-02 ENCOUNTER — Ambulatory Visit
Admission: RE | Admit: 2019-08-02 | Discharge: 2019-08-02 | Disposition: A | Discharge status: Home / Self Care | Payer: BC Managed Care – PPO | Source: Ambulatory Visit | Attending: Nurse Practitioner | Admitting: Nurse Practitioner

## 2019-08-02 DIAGNOSIS — R1084 Generalized abdominal pain: Secondary | ICD-10-CM

## 2019-08-14 ENCOUNTER — Encounter: Payer: Self-pay | Admitting: Nurse Practitioner

## 2019-08-14 LAB — HM COLONOSCOPY

## 2019-08-29 ENCOUNTER — Other Ambulatory Visit: Payer: Self-pay | Admitting: Nurse Practitioner

## 2019-09-17 ENCOUNTER — Other Ambulatory Visit: Payer: Self-pay | Admitting: Nurse Practitioner

## 2019-09-17 DIAGNOSIS — R935 Abnormal findings on diagnostic imaging of other abdominal regions, including retroperitoneum: Secondary | ICD-10-CM

## 2019-09-17 DIAGNOSIS — R1030 Lower abdominal pain, unspecified: Secondary | ICD-10-CM

## 2019-09-17 NOTE — Progress Notes (Signed)
Referral made 

## 2019-09-17 NOTE — Progress Notes (Signed)
Referral for gyn done

## 2019-09-19 ENCOUNTER — Ambulatory Visit: Payer: Self-pay | Admitting: Surgery

## 2019-09-19 NOTE — H&P (Signed)
CC: Referred by Dr. Benson Norway for recurrent diverticulitis  HPI: Katie Woodard is a very pleasant 71yoF with hx of ?IBS, recurrent diverticulitis - he reports that approximately 2-3 years ago (having problems with recurrent left lower quadrant abdominal pain. She has CT scans dating back to at least 2018 which show acute diverticulitis of the descending/proximal sigmoid colon without evidence of abscesses or perforations. Per chart review, CT scan 03/2017, 12/2017, 12/2018 (this one showing 18 x 15 mm intramural abscess), 06/2019 SHOWED evidence of diverticulitis. She reports associated nausea/vomiting that she has these flares. She just recovered from her most recent attack last month. She underwent colonoscopy with Dr. Benson Norway 08/14/2019 which we have a copy of the report that demonstrated diverticulosis in the sigmoid and descending colon. There is no other findings noted in his report. She is here today for follow-up. She has recovered from her most recent attack has no abdominal pain today. She denies fevers/chills and is having BMs. She does report possible history of IBS and has been on Linzess for this. She reports gas-like pains in her abdomen that did seem somewhat improved with taking Linzess. She has not noticed any changes in the frequency of her diverticular symptoms. She does report that the frequency of attacks has become increasingly problematic and is now interfering with her daily quality life.  PMH: Seasonal allergies; possible IBS; recurrent diverticulitis  PSH: C-sx x4; laparoscopic appy, 01/2014 (Dr. Hulen Skains); laparoscopic cholecystectomy years prior to appendectomy  FHx: Denies FHx of malignancy  Social: Denies use of tobacco/EtOH/drugs. Currently not working at this time  ROS: A comprehensive 10 system review of systems was completed with the patient and pertinent findings as noted above.  The patient is a 54 year old female.   Past Surgical History Sabino Gasser, [place];  08/20/2019 10:53 AM) Appendectomy  Cesarean Section - Multiple  Colon Polyp Removal - Colonoscopy  Gallbladder Surgery - Open   Diagnostic Studies History Sabino Gasser, CMA; 08/20/2019 10:53 AM) Colonoscopy  within last year Pap Smear  1-5 years ago  Allergies Sabino Gasser, CMA; 08/20/2019 10:54 AM) No Known Drug Allergies [08/20/2019]: Allergies Reconciled   Medication History Sabino Gasser, CMA; 08/20/2019 10:54 AM) Tylenol (325MG  Tablet, Oral) Active. Linzess (145MCG Capsule, Oral) Active. Medications Reconciled  Social History Sabino Gasser, CMA; 08/20/2019 10:53 AM) Alcohol use  Occasional alcohol use. Caffeine use  Carbonated beverages, Coffee. No drug use  Tobacco use  Never smoker.  Family History Sabino Gasser, [place]; 08/20/2019 10:53 AM) Alcohol Abuse  Father. Arthritis  Father. Breast Cancer  Mother. Diabetes Mellitus  Father. Hypertension  Father. Migraine Headache  Father, Mother.  Pregnancy / Birth History Sabino Gasser, [place]; 08/20/2019 10:53 AM) Age at menarche  61 years. Age of menopause  33-55 Gravida  5 Irregular periods  Length (months) of breastfeeding  3-6 Maternal age  58-20 Para  4  Other Problems Sabino Gasser, [place]; 08/20/2019 10:53 AM) Arthritis  Diverticulosis  Gastroesophageal Reflux Disease  Migraine Headache     Review of Systems Harrell Gave M. Shandra Szymborski MD; 08/20/2019 11:51 AM) General Present- Appetite Loss, Fatigue, Night Sweats and Weight Gain. Not Present- Chills, Fever and Weight Loss. Skin Present- Dryness. Not Present- Change in Wart/Mole, Hives, Jaundice, New Lesions, Non-Healing Wounds, Rash and Ulcer. HEENT Present- Seasonal Allergies and Sinus Pain. Not Present- Earache, Hearing Loss, Hoarseness, Nose Bleed, Oral Ulcers, Ringing in the Ears, Sore Throat, Visual Disturbances, Wears glasses/contact lenses and Yellow Eyes. Respiratory Not Present- Bloody sputum, Chronic Cough, Difficulty Breathing,  Snoring and Wheezing.  Breast Not Present- Breast Mass, Breast Pain, Nipple Discharge and Skin Changes. Cardiovascular Present- Shortness of Breath. Not Present- Chest Pain, Difficulty Breathing Lying Down, Leg Cramps, Palpitations, Rapid Heart Rate and Swelling of Extremities. Gastrointestinal Present- Abdominal Pain, Bloating, Change in Bowel Habits, Chronic diarrhea, Constipation, Indigestion and Nausea. Not Present- Bloody Stool, Difficulty Swallowing, Excessive gas, Gets full quickly at meals, Hemorrhoids and Rectal Pain. Female Genitourinary Present- Frequency and Pelvic Pain. Not Present- Nocturia, Painful Urination and Urgency. Musculoskeletal Present- Joint Stiffness and Swelling of Extremities. Not Present- Back Pain, Joint Pain, Muscle Pain and Muscle Weakness. Neurological Present- Headaches, Numbness and Weakness. Not Present- Decreased Memory, Fainting, Seizures, Tingling, Tremor and Trouble walking. Psychiatric Present- Change in Sleep Pattern. Not Present- Anxiety, Bipolar, Depression, Fearful and Frequent crying. Endocrine Present- Hot flashes. Not Present- Cold Intolerance, Excessive Hunger, Hair Changes, Heat Intolerance and New Diabetes. Hematology Not Present- Blood Thinners, Easy Bruising, Excessive bleeding, Gland problems, HIV and Persistent Infections.  Vitals Sabino Gasser CMA; 08/20/2019 10:55 AM) 08/20/2019 10:54 AM Weight: 197 lb Height: 59in Body Surface Area: 1.83 m Body Mass Index: 39.79 kg/m  Temp.: 98.42F(Tympanic)  Pulse: 103 (Regular)  BP: 130/72 (Sitting, Left Arm, Standard)       Physical Exam Harrell Gave M. Miranda Garber MD; 08/20/2019 11:52 AM) The physical exam findings are as follows: Note:Constitutional: No acute distress; conversant; wearing medical mask Eyes: Moist conjunctiva; no lid lag; anicteric sclerae; pupils equal and round Lungs: Normal respiratory effort CV: rrr; no pitting edema GI: Abdomen soft, nontender, nondistended; no  palpable hepatosplenomegaly MSK: Normal gait; no clubbing/cyanosis Psychiatric: Appropriate affect; alert and oriented 3    Assessment & Plan Harrell Gave M. Taiyana Kissler MD; 08/20/2019 1:05 PM) DIVERTICULITIS (K57.92) Story: Katie Woodard is a very pleasant 25yoF with recurrent diverticulitis - no extraluminal abscess hx but multiple recurrent attacks Impression: -The anatomy and physiology of the GI tract was discussed at length with her today. The pathophysiology of diverticulitis was discussed with associated pictures. We discussed in setting of recurrent "uncomplicated" disease, indications for surgery being primarily based on quality of life type decisions. She currently reports that the frequency of attacks become more problematic requiring multiple trips to the hospital and is interfering with her quality of life to the point she would like to consider proceeding with surgery. -We discussed sigmoidectomy-robotic and potential open techniques, possible splenic flexure takedown if necessary, flexible sigmoidoscopy; uncommon but potential for needing ostomy with this surgery. -The planned procedure, material risks (including, but not limited to, pain, bleeding, infection, scarring, need for blood transfusion, damage to surrounding structures- blood vessels/nerves/viscus/organs/spleen, damage to ureter, leak from anastomosis, need for additional procedures, need for stoma which could be permanent, hernia, recurrence although less common than without surgery, pneumonia, heart attack, stroke, death) benefits and alternatives to surgery were discussed at length. I noted a good probability that the procedure would help improve her symptoms by significantly reducing the probability of recurrence. The patient's questions were answered to her satisfaction, she voiced understanding and elected to proceed with surgery. Additionally, we discussed typical postoperative expectations and the recovery process. -We  discussed timing pain as "elective" OR availability becomes available amid the COVID 19 pandemic - may be a couple months  This patient encounter took 35 minutes today to perform the following: take history, perform exam, review outside records, review/interpret imaging, counsel the patient on their diagnosis and document encounter, findings & plan in the EHR  Signed by Ileana Roup, MD (08/20/2019 1:06 PM)

## 2019-10-15 ENCOUNTER — Other Ambulatory Visit: Payer: Self-pay | Admitting: Gastroenterology

## 2019-10-15 ENCOUNTER — Ambulatory Visit
Admission: RE | Admit: 2019-10-15 | Discharge: 2019-10-15 | Disposition: A | Discharge status: Home / Self Care | Payer: BC Managed Care – PPO | Source: Ambulatory Visit | Attending: Gastroenterology | Admitting: Gastroenterology

## 2019-10-15 ENCOUNTER — Other Ambulatory Visit: Payer: Self-pay

## 2019-10-15 DIAGNOSIS — R1032 Left lower quadrant pain: Secondary | ICD-10-CM

## 2019-10-15 MED ORDER — IOPAMIDOL (ISOVUE-300) INJECTION 61%
100.0000 mL | Freq: Once | INTRAVENOUS | Status: AC | PRN
  Administered 2019-10-15: 100 mL via INTRAVENOUS

## 2019-10-23 ENCOUNTER — Encounter: Payer: BC Managed Care – PPO | Admitting: Nurse Practitioner

## 2019-10-29 NOTE — Patient Instructions (Addendum)
DUE TO COVID-19 ONLY ONE VISITOR IS ALLOWED TO COME WITH YOU AND STAY IN THE WAITING ROOM ONLY DURING PRE OP AND PROCEDURE DAY OF SURGERY. THE 1 VISITOR MAY VISIT WITH YOU AFTER SURGERY IN YOUR PRIVATE ROOM DURING VISITING HOURS ONLY!  YOU NEED TO HAVE A COVID 19 TEST ON 10-30-19 @ 9:15 AM. THIS TEST MUST BE DONE BEFORE SURGERY, COME  [address], [address] [place] , [id number].  ([place]) ONCE YOUR COVID TEST IS COMPLETED, PLEASE BEGIN THE QUARANTINE INSTRUCTIONS AS OUTLINED IN YOUR HANDOUT.                Katie Woodard  10/29/2019   Your procedure is scheduled on: 11-02-19   Report to [facility] Main  Entrance    Report to admitting at 10:30 AM     Call this number if you have problems the morning of surgery [phone]   PLEASE CONSUME A CLEAR LIQUID DIET THE DAY OF PREP, PER YOUR SURGEON'S INSTRUCTIONS.    Remember: DRINK 2 PRESURGERY ENSURE DRINKS THE NIGHT BEFORE SURGERY AT 1000 PM AND 1 PRESURGERY DRINK THE DAY OF THE PROCEDURE 3 HOURS PRIOR TO SCHEDULED SURGERY. NO SOLIDS AFTER MIDNIGHT THE DAY PRIOR TO THE SURGERY. NOTHING BY MOUTH EXCEPT CLEAR LIQUIDS UNTIL THREE HOURS PRIOR TO SCHEDULED SURGERY. PLEASE FINISH PRESURGERY ENSURE DRINK PER SURGEON ORDER 3 HOURS PRIOR TO SCHEDULED SURGERY TIME WHICH NEEDS TO BE COMPLETED AT 9:30 AM.    CLEAR LIQUID DIET   Foods Allowed                                                                     Foods Excluded  Coffee and tea, regular and decaf                             liquids that you cannot  Plain Jell-O any favor except red or purple                                           see through such as: Fruit ices (not with fruit pulp)                                     milk, soups, orange juice  Iced Popsicles                                    All solid food Carbonated beverages, regular and diet                                    Cranberry, grape and apple juices Sports drinks like Gatorade Lightly seasoned  clear broth or consume(fat free) Sugar, honey syrup  Sample Menu Breakfast  Lunch                                     Supper Cranberry juice                    Beef broth                            Chicken broth Jell-O                                     Grape juice                           Apple juice Coffee or tea                        Jell-O                                      Popsicle                                                Coffee or tea                        Coffee or tea  _____________________________________________________________________    Take these medicines the morning of surgery with A SIP OF WATER: Famotidine (Pepcid), and Percocet, if needed.   BRUSH YOUR TEETH MORNING OF SURGERY AND RINSE YOUR MOUTH OUT, NO CHEWING GUM CANDY OR MINTS.                                You may not have any metal on your body including hair pins and              piercings     Do not wear jewelry, make-up, lotions, powders or perfumes, deodorant              Do not wear nail polish on your fingernails.  Do not shave  48 hours prior to surgery.               Do not bring valuables to the hospital. [place].  Contacts, dentures or bridgework may not be worn into surgery.  You may bring a small overnight bag    Special Instructions: N/A              Please read over the following fact sheets you were given: _____________________________________________________________________             [facility] - Preparing for Surgery Before surgery, you can play an important role.  Because skin is not sterile, your skin needs to be as free of germs as possible.  You can reduce the number of germs on your skin by washing with CHG (chlorahexidine gluconate) soap before surgery.  CHG is  an antiseptic cleaner which kills germs and bonds with the skin to continue killing germs even after washing. Please DO NOT  use if you have an allergy to CHG or antibacterial soaps.  If your skin becomes reddened/irritated stop using the CHG and inform your nurse when you arrive at Short Stay. Do not shave (including legs and underarms) for at least 48 hours prior to the first CHG shower.  You may shave your face/neck. Please follow these instructions carefully:  1.  Shower with CHG Soap the night before surgery and the  morning of Surgery.  2.  If you choose to wash your hair, wash your hair first as usual with your  normal  shampoo.  3.  After you shampoo, rinse your hair and body thoroughly to remove the  shampoo.                           4.  Use CHG as you would any other liquid soap.  You can apply chg directly  to the skin and wash                       Gently with a scrungie or clean washcloth.  5.  Apply the CHG Soap to your body ONLY FROM THE NECK DOWN.   Do not use on face/ open                           Wound or open sores. Avoid contact with eyes, ears mouth and genitals (private parts).                       Wash face,  Genitals (private parts) with your normal soap.             6.  Wash thoroughly, paying special attention to the area where your surgery  will be performed.  7.  Thoroughly rinse your body with warm water from the neck down.  8.  DO NOT shower/wash with your normal soap after using and rinsing off  the CHG Soap.                9.  Pat yourself dry with a clean towel.            10.  Wear clean pajamas.            11.  Place clean sheets on your bed the night of your first shower and do not  sleep with pets. Day of Surgery : Do not apply any lotions/deodorants the morning of surgery.  Please wear clean clothes to the hospital/surgery center.  FAILURE TO FOLLOW THESE INSTRUCTIONS MAY RESULT IN THE CANCELLATION OF YOUR SURGERY PATIENT SIGNATURE_________________________________  NURSE  SIGNATURE__________________________________  ________________________________________________________________________   Katie Woodard  An incentive spirometer is a tool that can help keep your lungs clear and active. This tool measures how well you are filling your lungs with each breath. Taking long deep breaths may help reverse or decrease the chance of developing breathing (pulmonary) problems (especially infection) following:  A long period of time when you are unable to move or be active. BEFORE THE PROCEDURE   If the spirometer includes an indicator to show your best effort, your nurse or respiratory therapist will set it to a desired goal.  If possible, sit up straight or lean slightly forward. Try not to slouch.  Hold  the incentive spirometer in an upright position. INSTRUCTIONS FOR USE  1. Sit on the edge of your bed if possible, or sit up as far as you can in bed or on a chair. 2. Hold the incentive spirometer in an upright position. 3. Breathe out normally. 4. Place the mouthpiece in your mouth and seal your lips tightly around it. 5. Breathe in slowly and as deeply as possible, raising the piston or the ball toward the top of the column. 6. Hold your breath for 3-5 seconds or for as long as possible. Allow the piston or ball to fall to the bottom of the column. 7. Remove the mouthpiece from your mouth and breathe out normally. 8. Rest for a few seconds and repeat Steps 1 through 7 at least 10 times every 1-2 hours when you are awake. Take your time and take a few normal breaths between deep breaths. 9. The spirometer may include an indicator to show your best effort. Use the indicator as a goal to work toward during each repetition. 10. After each set of 10 deep breaths, practice coughing to be sure your lungs are clear. If you have an incision (the cut made at the time of surgery), support your incision when coughing by placing a pillow or rolled up towels firmly  against it. Once you are able to get out of bed, walk around indoors and cough well. You may stop using the incentive spirometer when instructed by your caregiver.  RISKS AND COMPLICATIONS  Take your time so you do not get dizzy or light-headed.  If you are in pain, you may need to take or ask for pain medication before doing incentive spirometry. It is harder to take a deep breath if you are having pain. AFTER USE  Rest and breathe slowly and easily.  It can be helpful to keep track of a log of your progress. Your caregiver can provide you with a simple table to help with this. If you are using the spirometer at home, follow these instructions: [place] IF:   You are having difficultly using the spirometer.  You have trouble using the spirometer as often as instructed.  Your pain medication is not giving enough relief while using the spirometer.  You develop fever of 100.5 F (38.1 C) or higher. SEEK IMMEDIATE MEDICAL CARE IF:   You cough up bloody sputum that had not been present before.  You develop fever of 102 F (38.9 C) or greater.  You develop worsening pain at or near the incision site. MAKE SURE YOU:   Understand these instructions.  Will watch your condition.  Will get help right away if you are not doing well or get worse. Document Released: 11/22/2006 Document Revised: 10/04/2011 Document Reviewed: 01/23/2007 [facility] Patient Information 2014 [place], [place].   ________________________________________________________________________

## 2019-10-29 NOTE — Progress Notes (Signed)
PCP - Minette Brine, FNP Cardiologist -   Chest x-ray - 07-12-19 EKG -  Stress Test -  ECHO -  Cardiac Cath -   Sleep Study -  CPAP -   Fasting Blood Sugar -  Checks Blood Sugar _____ times a day  Blood Thinner Instructions: Aspirin Instructions: Last Dose:  Anesthesia review:   Patient denies shortness of breath, fever, cough and chest pain at PAT appointment   Patient verbalized understanding of instructions that were given to them at the PAT appointment. Patient was also instructed that they will need to review over the PAT instructions again at home before surgery.

## 2019-10-30 ENCOUNTER — Other Ambulatory Visit (HOSPITAL_COMMUNITY)
Admission: RE | Admit: 2019-10-30 | Discharge: 2019-10-30 | Disposition: A | Discharge status: Home / Self Care | Payer: BC Managed Care – PPO | Source: Ambulatory Visit | Attending: Surgery | Admitting: Surgery

## 2019-10-30 ENCOUNTER — Other Ambulatory Visit: Payer: Self-pay

## 2019-10-30 ENCOUNTER — Encounter (HOSPITAL_COMMUNITY)
Admission: RE | Admit: 2019-10-30 | Discharge: 2019-10-30 | Disposition: A | Discharge status: Home / Self Care | Payer: BC Managed Care – PPO | Source: Ambulatory Visit | Attending: Surgery | Admitting: Surgery

## 2019-10-30 ENCOUNTER — Encounter (HOSPITAL_COMMUNITY): Payer: Self-pay

## 2019-10-30 ENCOUNTER — Encounter (HOSPITAL_COMMUNITY): Payer: Self-pay | Admitting: Physician Assistant

## 2019-10-30 DIAGNOSIS — Z79899 Other long term (current) drug therapy: Secondary | ICD-10-CM | POA: Insufficient documentation

## 2019-10-30 DIAGNOSIS — K5792 Diverticulitis of intestine, part unspecified, without perforation or abscess without bleeding: Secondary | ICD-10-CM | POA: Diagnosis not present

## 2019-10-30 DIAGNOSIS — K219 Gastro-esophageal reflux disease without esophagitis: Secondary | ICD-10-CM | POA: Diagnosis not present

## 2019-10-30 DIAGNOSIS — Z20822 Contact with and (suspected) exposure to covid-19: Secondary | ICD-10-CM | POA: Insufficient documentation

## 2019-10-30 DIAGNOSIS — Z01812 Encounter for preprocedural laboratory examination: Secondary | ICD-10-CM | POA: Insufficient documentation

## 2019-10-30 LAB — COMPREHENSIVE METABOLIC PANEL
ALT: 14 U/L (ref 0–44)
AST: 16 U/L (ref 15–41)
Albumin: 4.1 g/dL (ref 3.5–5.0)
Alkaline Phosphatase: 61 U/L (ref 38–126)
Anion gap: 10 (ref 5–15)
BUN: 10 mg/dL (ref 6–20)
CO2: 25 mmol/L (ref 22–32)
Calcium: 9.4 mg/dL (ref 8.9–10.3)
Chloride: 106 mmol/L (ref 98–111)
Creatinine, Ser: 0.51 mg/dL (ref 0.44–1.00)
GFR calc Af Amer: >60 mL/min (ref >60)
GFR calc non Af Amer: >60 mL/min (ref >60)
Glucose, Bld: 98 mg/dL (ref 70–99)
Potassium: 4 mmol/L (ref 3.5–5.1)
Sodium: 141 mmol/L (ref 135–145)
Total Bilirubin: 0.4 mg/dL (ref 0.3–1.2)
Total Protein: 7.7 g/dL (ref 6.5–8.1)

## 2019-10-30 LAB — CBC WITH DIFFERENTIAL/PLATELET
Abs Immature Granulocytes: 0.06 10*3/uL (ref 0.00–0.07)
Basophils Absolute: 0 10*3/uL (ref 0.0–0.1)
Basophils Relative: 0 %
Eosinophils Absolute: 0.1 10*3/uL (ref 0.0–0.5)
Eosinophils Relative: 1 %
HCT: 40.9 % (ref 36.0–46.0)
Hemoglobin: 12.1 g/dL (ref 12.0–15.0)
Immature Granulocytes: 1 %
Lymphocytes Relative: 26 %
Lymphs Abs: 2 10*3/uL (ref 0.7–4.0)
MCH: 20.8 pg — ABNORMAL LOW (ref 26.0–34.0)
MCHC: 29.6 g/dL — ABNORMAL LOW (ref 30.0–36.0)
MCV: 70.3 fL — ABNORMAL LOW (ref 80.0–100.0)
Monocytes Absolute: 0.4 10*3/uL (ref 0.1–1.0)
Monocytes Relative: 5 %
Neutro Abs: 5.1 10*3/uL (ref 1.7–7.7)
Neutrophils Relative %: 67 %
Platelets: 373 10*3/uL (ref 150–400)
RBC: 5.82 MIL/uL — ABNORMAL HIGH (ref 3.87–5.11)
RDW: 18.7 % — ABNORMAL HIGH (ref 11.5–15.5)
WBC: 7.6 10*3/uL (ref 4.0–10.5)
nRBC: 0 % (ref 0.0–0.2)

## 2019-10-30 LAB — APTT: aPTT: 34 seconds (ref 24–36)

## 2019-10-30 LAB — PROTIME-INR
INR: 1 (ref 0.8–1.2)
Prothrombin Time: 12.6 seconds (ref 11.4–15.2)

## 2019-10-30 LAB — HEMOGLOBIN A1C
Hgb A1c MFr Bld: 6 % — ABNORMAL HIGH (ref 4.8–5.6)
Mean Plasma Glucose: 125.5 mg/dL

## 2019-10-30 LAB — SARS CORONAVIRUS 2 (TAT 6-24 HRS): SARS Coronavirus 2: NEGATIVE

## 2019-10-30 LAB — ABO/RH: ABO/RH(D): B POS

## 2019-11-01 MED ORDER — BUPIVACAINE LIPOSOME 1.3 % IJ SUSP
20.0000 mL | Freq: Once | INTRAMUSCULAR | Status: DC
  Filled 2019-11-01: qty 20

## 2019-11-02 ENCOUNTER — Encounter (HOSPITAL_COMMUNITY): Admission: RE | Payer: Self-pay | Source: Home / Self Care

## 2019-11-02 ENCOUNTER — Inpatient Hospital Stay (HOSPITAL_COMMUNITY): Admission: RE | Admit: 2019-11-02 | Payer: BC Managed Care – PPO | Source: Home / Self Care | Admitting: Surgery

## 2019-11-02 LAB — TYPE AND SCREEN
ABO/RH(D): B POS
Antibody Screen: NEGATIVE

## 2019-11-02 SURGERY — COLECTOMY, PARTIAL, ROBOT-ASSISTED, LAPAROSCOPIC
Anesthesia: General

## 2019-11-07 ENCOUNTER — Other Ambulatory Visit: Payer: Self-pay | Admitting: Surgery

## 2019-11-07 DIAGNOSIS — N2889 Other specified disorders of kidney and ureter: Secondary | ICD-10-CM

## 2019-11-12 ENCOUNTER — Other Ambulatory Visit: Payer: Self-pay

## 2019-11-12 ENCOUNTER — Ambulatory Visit
Admission: RE | Admit: 2019-11-12 | Discharge: 2019-11-12 | Disposition: A | Discharge status: Home / Self Care | Payer: BC Managed Care – PPO | Source: Ambulatory Visit | Attending: Surgery | Admitting: Surgery

## 2019-11-12 DIAGNOSIS — N2889 Other specified disorders of kidney and ureter: Secondary | ICD-10-CM

## 2019-11-12 MED ORDER — GADOBENATE DIMEGLUMINE 529 MG/ML IV SOLN
19.0000 mL | Freq: Once | INTRAVENOUS | Status: AC | PRN
  Administered 2019-11-12: 19 mL via INTRAVENOUS

## 2019-11-19 ENCOUNTER — Ambulatory Visit: Payer: Self-pay | Admitting: Surgery

## 2019-11-19 NOTE — H&P (Signed)
CC: Referred by Dr. Benson Norway for recurrent diverticulitis  HPI: Katie Woodard is a very pleasant 24yoF with hx of ?IBS, recurrent diverticulitis - he reports that approximately 2-3 years ago (having problems with recurrent left lower quadrant abdominal pain. She has CT scans dating back to at least 2018 which show acute diverticulitis of the descending/proximal sigmoid colon without evidence of abscesses or perforations. Per chart review, CT scan 03/2017, 12/2017, 12/2018 (this one showing 18 x 15 mm intramural abscess), 06/2019 SHOWED evidence of diverticulitis. She reports associated nausea/vomiting that she has these flares. She just recovered from her most recent attack last month. She underwent colonoscopy with Dr. Benson Norway 08/14/2019 which we have a copy of the report that demonstrated diverticulosis in the sigmoid and descending colon. There is no other findings noted in his report. She is here today for follow-up. She has recovered from her most recent attack has no abdominal pain today. She denies fevers/chills and is having BMs. She does report possible history of IBS and has been on Linzess for this. She reports gas-like pains in her abdomen that did seem somewhat improved with taking Linzess. She has not noticed any changes in the frequency of her diverticular symptoms. She does report that the frequency of attacks has become increasingly problematic and is now interfering with her daily quality life.  PMH: Seasonal allergies; possible IBS; recurrent diverticulitis  PSH: C-sx x4; laparoscopic appy, 01/2014 (Dr. Hulen Skains); laparoscopic cholecystectomy years prior to appendectomy  FHx: Denies FHx of malignancy  Social: Denies use of tobacco/EtOH/drugs. Currently not working at this time  ROS: A comprehensive 10 system review of systems was completed with the patient and pertinent findings as noted above.  The patient is a 54 year old female.   Past Surgical History Katie Woodard, Katie Woodard;  08/20/2019 10:53 AM) Appendectomy  Cesarean Section - Multiple  Colon Polyp Removal - Colonoscopy  Gallbladder Surgery - Open   Diagnostic Studies History Katie Woodard, Katie Woodard; 08/20/2019 10:53 AM) Colonoscopy  within last year Pap Smear  1-5 years ago  Allergies Katie Woodard, Katie Woodard; 08/20/2019 10:54 AM) No Known Drug Allergies  [08/20/2019]: Allergies Reconciled   Medication History Katie Woodard, Katie Woodard; 08/20/2019 10:54 AM) Tylenol (325MG  Tablet, Oral) Active. Linzess (145MCG Capsule, Oral) Active. Medications Reconciled  Social History Katie Woodard, Katie Woodard; 08/20/2019 10:53 AM) Alcohol use  Occasional alcohol use. Caffeine use  Carbonated beverages, Coffee. No drug use  Tobacco use  Never smoker.  Family History Katie Woodard, Katie Woodard; 08/20/2019 10:53 AM) Alcohol Abuse  Father. Arthritis  Father. Breast Cancer  Mother. Diabetes Mellitus  Father. Hypertension  Father. Migraine Headache  Father, Mother.  Pregnancy / Birth History Katie Woodard, Katie Woodard; 08/20/2019 10:53 AM) Age at menarche  53 years. Age of menopause  30-55 Gravida  5 Irregular periods  Length (months) of breastfeeding  3-6 Maternal age  87-20 Para  4  Other Problems Katie Woodard, Katie Woodard; 08/20/2019 10:53 AM) Arthritis  Diverticulosis  Gastroesophageal Reflux Disease  Migraine Headache     Review of Systems [place] Gave M. Myia Bergh MD; 08/20/2019 11:51 AM) General Present- Appetite Loss, Fatigue, Night Sweats and Weight Gain. Not Present- Chills, Fever and Weight Loss. Skin Present- Dryness. Not Present- Change in Wart/Mole, Hives, Jaundice, New Lesions, Non-Healing Wounds, Rash and Ulcer. HEENT Present- Seasonal Allergies and Sinus Pain. Not Present- Earache, Hearing Loss, Hoarseness, Nose Bleed, Oral Ulcers, Ringing in the Ears, Sore Throat, Visual Disturbances, Wears glasses/contact lenses and Yellow Eyes. Respiratory Not Present- Bloody sputum, Chronic Cough, Difficulty Breathing,  Snoring and  Wheezing. Breast Not Present- Breast Mass, Breast Pain, Nipple Discharge and Skin Changes. Cardiovascular Present- Shortness of Breath. Not Present- Chest Pain, Difficulty Breathing Lying Down, Leg Cramps, Palpitations, Rapid Heart Rate and Swelling of Extremities. Gastrointestinal Present- Abdominal Pain, Bloating, Change in Bowel Habits, Chronic diarrhea, Constipation, Indigestion and Nausea. Not Present- Bloody Stool, Difficulty Swallowing, Excessive gas, Gets full quickly at meals, Hemorrhoids and Rectal Pain. Female Genitourinary Present- Frequency and Pelvic Pain. Not Present- Nocturia, Painful Urination and Urgency. Musculoskeletal Present- Joint Stiffness and Swelling of Extremities. Not Present- Back Pain, Joint Pain, Muscle Pain and Muscle Weakness. Neurological Present- Headaches, Numbness and Weakness. Not Present- Decreased Memory, Fainting, Seizures, Tingling, Tremor and Trouble walking. Psychiatric Present- Change in Sleep Pattern. Not Present- Anxiety, Bipolar, Depression, Fearful and Frequent crying. Endocrine Present- Hot flashes. Not Present- Cold Intolerance, Excessive Hunger, Hair Changes, Heat Intolerance and New Diabetes. Hematology Not Present- Blood Thinners, Easy Bruising, Excessive bleeding, Gland problems, HIV and Persistent Infections.  Vitals Katie Woodard Katie Woodard; 08/20/2019 10:55 AM) 08/20/2019 10:54 AM Weight: 197 lb Height: 59in Body Surface Area: 1.83 m Body Mass Index: 39.79 kg/m  Temp.: 98.45F (Tympanic)  Pulse: 103 (Regular)  BP: 130/72(Sitting, Left Arm, Standard)       Physical Exam [place] Gave M. Yohannes Waibel MD; 08/20/2019 11:52 AM) The physical exam findings are as follows: Note: Constitutional: No acute distress; conversant; wearing medical mask Eyes: Moist conjunctiva; no lid lag; anicteric sclerae; pupils equal and round Lungs: Normal respiratory effort CV: rrr; no pitting edema GI: Abdomen soft, nontender, nondistended; no  palpable hepatosplenomegaly MSK: Normal gait; no clubbing/cyanosis Psychiatric: Appropriate affect; alert and oriented 3    Assessment & Plan [place]rell Gave M. Dustin Bumbaugh MD; 08/20/2019 1:05 PM) DIVERTICULITIS (K57.92) Story: Ms. Flammer is a very pleasant 1yoF with recurrent diverticulitis - no extraluminal abscess hx but multiple recurrent attacks Impression: -The anatomy and physiology of the GI tract was discussed at length with her today. The pathophysiology of diverticulitis was discussed with associated pictures. We discussed in setting of recurrent "uncomplicated" disease, indications for surgery being primarily based on quality of life type decisions. She currently reports that the frequency of attacks become more problematic requiring multiple trips to the hospital and is interfering with her quality of life to the point she would like to consider proceeding with surgery. -We discussed sigmoidectomy-robotic and potential open techniques, possible splenic flexure takedown if necessary, flexible sigmoidoscopy; uncommon but potential for needing ostomy with this surgery. -The planned procedure, material risks (including, but not limited to, pain, bleeding, infection, scarring, need for blood transfusion, damage to surrounding structures- blood vessels/nerves/viscus/organs/spleen, damage to ureter, leak from anastomosis, need for additional procedures, need for stoma which could be permanent, hernia, recurrence although less common than without surgery, pneumonia, heart attack, stroke, death) benefits and alternatives to surgery were discussed at length. I noted a good probability that the procedure would help improve her symptoms by significantly reducing the probability of recurrence. The patient's questions were answered to her satisfaction, she voiced understanding and elected to proceed with surgery. Additionally, we discussed typical postoperative expectations and the recovery process. -Intervally  had a CT a/p just before surgery showing renal lesions not previously mentioned - her case had to be moved for further workup of these newly identified findings. MRI Abdomen 11/12/19 showed her renal cysts to appear benign with no specific follow-up recommended  Signed by Ileana Roup, MD (08/20/2019 1:06 PM)

## 2019-12-14 ENCOUNTER — Encounter (HOSPITAL_COMMUNITY): Payer: BC Managed Care – PPO

## 2019-12-14 ENCOUNTER — Encounter (HOSPITAL_COMMUNITY)
Admission: RE | Admit: 2019-12-14 | Discharge: 2019-12-14 | Disposition: A | Discharge status: Home / Self Care | Payer: BC Managed Care – PPO | Source: Ambulatory Visit | Attending: Surgery | Admitting: Surgery

## 2019-12-18 ENCOUNTER — Other Ambulatory Visit (HOSPITAL_COMMUNITY): Payer: BC Managed Care – PPO

## 2020-01-24 HISTORY — PX: COLON SURGERY: SHX602

## 2020-02-04 NOTE — Patient Instructions (Addendum)
DUE TO COVID-19 ONLY ONE VISITOR IS ALLOWED TO COME WITH YOU AND STAY IN THE WAITING ROOM ONLY DURING PRE OP AND PROCEDURE DAY OF SURGERY. THE 1 VISITOR MAY VISIT WITH YOU AFTER SURGERY IN YOUR PRIVATE ROOM DURING VISITING HOURS ONLY!  YOU NEED TO HAVE A COVID 19 TEST ON: 02/09/20 @ 9:15 am, THIS TEST MUST BE DONE BEFORE SURGERY, COME  [address], [place] , [address].  ([place]) ONCE YOUR COVID TEST IS COMPLETED, PLEASE BEGIN THE QUARANTINE INSTRUCTIONS AS OUTLINED IN YOUR HANDOUT.                Katie Woodard    Your procedure is scheduled on: 02/13/20   Report to [facility] Main  Entrance   Report to admitting at: 6:30 AM     Call this number if you have problems the morning of surgery [phone]    Remember: Drink plenty clear liquids the day before surgery.    CLEAR LIQUID DIET   Foods Allowed                                                                     Foods Excluded  Coffee and tea, regular and decaf                             liquids that you cannot  Plain Jell-O any favor except red or purple                                           see through such as: Fruit ices (not with fruit pulp)                                     milk, soups, orange juice  Iced Popsicles                                    All solid food Carbonated beverages, regular and diet                                    Cranberry, grape and apple juices Sports drinks like Gatorade Lightly seasoned clear broth or consume(fat free) Sugar, honey syrup  Sample Menu Breakfast                                Lunch                                     Supper Cranberry juice                    Beef broth  Chicken broth Jell-O                                     Grape juice                           Apple juice Coffee or tea                        Jell-O                                      Popsicle                                                 Coffee or tea                        Coffee or tea  _____________________________________________________________________  Do not eat solid food :After Midnight. Clear liquid diet from midnight until 5:30 am.   BRUSH YOUR TEETH MORNING OF SURGERY AND RINSE YOUR MOUTH OUT, NO CHEWING GUM CANDY OR MINTS.     Take these medicines the morning of surgery with A SIP OF WATER: famotidine.                                You may not have any metal on your body including hair pins and              piercings  Do not wear jewelry, make-up, lotions, powders or perfumes, deodorant             Do not wear nail polish on your fingernails.  Do not shave  48 hours prior to surgery.     Do not bring valuables to the hospital. [place].  Contacts, dentures or bridgework may not be worn into surgery.  Leave suitcase in the car. After surgery it may be brought to your room.     Patients discharged the day of surgery will not be allowed to drive home. IF YOU ARE HAVING SURGERY AND GOING HOME THE SAME DAY, YOU MUST HAVE AN ADULT TO DRIVE YOU HOME AND BE WITH YOU FOR 24 HOURS. YOU MAY GO HOME BY TAXI OR UBER OR ORTHERWISE, BUT AN ADULT MUST ACCOMPANY YOU HOME AND STAY WITH YOU FOR 24 HOURS.  Name and phone number of your driver:  Special Instructions: N/A              Please read over the following fact sheets you were given: _____________________________________________________________________        [facility] - Preparing for Surgery Before surgery, you can play an important role.  Because skin is not sterile, your skin needs to be as free of germs as possible.  You can reduce the number of germs on your skin by washing with CHG (chlorahexidine gluconate) soap before surgery.  CHG is an antiseptic cleaner which kills germs and bonds with the skin to continue  killing germs even after washing. Please DO NOT use if you have an allergy to CHG or  antibacterial soaps.  If your skin becomes reddened/irritated stop using the CHG and inform your nurse when you arrive at Short Stay. Do not shave (including legs and underarms) for at least 48 hours prior to the first CHG shower.  You may shave your face/neck. Please follow these instructions carefully:  1.  Shower with CHG Soap the night before surgery and the  morning of Surgery.  2.  If you choose to wash your hair, wash your hair first as usual with your  normal  shampoo.  3.  After you shampoo, rinse your hair and body thoroughly to remove the  shampoo.                           4.  Use CHG as you would any other liquid soap.  You can apply chg directly  to the skin and wash                       Gently with a scrungie or clean washcloth.  5.  Apply the CHG Soap to your body ONLY FROM THE NECK DOWN.   Do not use on face/ open                           Wound or open sores. Avoid contact with eyes, ears mouth and genitals (private parts).                       Wash face,  Genitals (private parts) with your normal soap.             6.  Wash thoroughly, paying special attention to the area where your surgery  will be performed.  7.  Thoroughly rinse your body with warm water from the neck down.  8.  DO NOT shower/wash with your normal soap after using and rinsing off  the CHG Soap.                9.  Pat yourself dry with a clean towel.            10.  Wear clean pajamas.            11.  Place clean sheets on your bed the night of your first shower and do not  sleep with pets. Day of Surgery : Do not apply any lotions/deodorants the morning of surgery.  Please wear clean clothes to the hospital/surgery center.  FAILURE TO FOLLOW THESE INSTRUCTIONS MAY RESULT IN THE CANCELLATION OF YOUR SURGERY PATIENT SIGNATURE_________________________________  NURSE SIGNATURE__________________________________  ________________________________________________________________________   Katie Woodard  An incentive spirometer is a tool that can help keep your lungs clear and active. This tool measures how well you are filling your lungs with each breath. Taking long deep breaths may help reverse or decrease the chance of developing breathing (pulmonary) problems (especially infection) following:  A long period of time when you are unable to move or be active. BEFORE THE PROCEDURE   If the spirometer includes an indicator to show your best effort, your nurse or respiratory therapist will set it to a desired goal.  If possible, sit up straight or lean slightly forward. Try not to slouch.  Hold the incentive spirometer in an upright position. INSTRUCTIONS FOR USE  1. Sit  on the edge of your bed if possible, or sit up as far as you can in bed or on a chair. 2. Hold the incentive spirometer in an upright position. 3. Breathe out normally. 4. Place the mouthpiece in your mouth and seal your lips tightly around it. 5. Breathe in slowly and as deeply as possible, raising the piston or the ball toward the top of the column. 6. Hold your breath for 3-5 seconds or for as long as possible. Allow the piston or ball to fall to the bottom of the column. 7. Remove the mouthpiece from your mouth and breathe out normally. 8. Rest for a few seconds and repeat Steps 1 through 7 at least 10 times every 1-2 hours when you are awake. Take your time and take a few normal breaths between deep breaths. 9. The spirometer may include an indicator to show your best effort. Use the indicator as a goal to work toward during each repetition. 10. After each set of 10 deep breaths, practice coughing to be sure your lungs are clear. If you have an incision (the cut made at the time of surgery), support your incision when coughing by placing a pillow or rolled up towels firmly against it. Once you are able to get out of bed, walk around indoors and cough well. You may stop using the incentive spirometer when  instructed by your caregiver.  RISKS AND COMPLICATIONS  Take your time so you do not get dizzy or light-headed.  If you are in pain, you may need to take or ask for pain medication before doing incentive spirometry. It is harder to take a deep breath if you are having pain. AFTER USE  Rest and breathe slowly and easily.  It can be helpful to keep track of a log of your progress. Your caregiver can provide you with a simple table to help with this. If you are using the spirometer at home, follow these instructions: [place] IF:   You are having difficultly using the spirometer.  You have trouble using the spirometer as often as instructed.  Your pain medication is not giving enough relief while using the spirometer.  You develop fever of 100.5 F (38.1 C) or higher. SEEK IMMEDIATE MEDICAL CARE IF:   You cough up bloody sputum that had not been present before.  You develop fever of 102 F (38.9 C) or greater.  You develop worsening pain at or near the incision site. MAKE SURE YOU:   Understand these instructions.  Will watch your condition.  Will get help right away if you are not doing well or get worse. Document Released: 11/22/2006 Document Revised: 10/04/2011 Document Reviewed: 01/23/2007 [facility] Patient Information 2014 [place], [place].   ________________________________________________________________________

## 2020-02-05 ENCOUNTER — Other Ambulatory Visit: Payer: Self-pay

## 2020-02-05 ENCOUNTER — Encounter (HOSPITAL_COMMUNITY): Payer: Self-pay

## 2020-02-05 ENCOUNTER — Encounter (HOSPITAL_COMMUNITY)
Admission: RE | Admit: 2020-02-05 | Discharge: 2020-02-05 | Disposition: A | Discharge status: Home / Self Care | Payer: BC Managed Care – PPO | Source: Ambulatory Visit | Attending: Surgery | Admitting: Surgery

## 2020-02-05 DIAGNOSIS — Z01818 Encounter for other preprocedural examination: Secondary | ICD-10-CM | POA: Insufficient documentation

## 2020-02-05 HISTORY — DX: Chronic kidney disease, unspecified: N18.9

## 2020-02-05 LAB — CBC WITH DIFFERENTIAL/PLATELET
Abs Immature Granulocytes: 0.03 10*3/uL (ref 0.00–0.07)
Basophils Absolute: 0 10*3/uL (ref 0.0–0.1)
Basophils Relative: 0 %
Eosinophils Absolute: 0.1 10*3/uL (ref 0.0–0.5)
Eosinophils Relative: 1 %
HCT: 39.7 % (ref 36.0–46.0)
Hemoglobin: 11.8 g/dL — ABNORMAL LOW (ref 12.0–15.0)
Immature Granulocytes: 0 %
Lymphocytes Relative: 27 %
Lymphs Abs: 2.1 10*3/uL (ref 0.7–4.0)
MCH: 20.7 pg — ABNORMAL LOW (ref 26.0–34.0)
MCHC: 29.7 g/dL — ABNORMAL LOW (ref 30.0–36.0)
MCV: 69.8 fL — ABNORMAL LOW (ref 80.0–100.0)
Monocytes Absolute: 0.4 10*3/uL (ref 0.1–1.0)
Monocytes Relative: 5 %
Neutro Abs: 5.4 10*3/uL (ref 1.7–7.7)
Neutrophils Relative %: 67 %
Platelets: 305 10*3/uL (ref 150–400)
RBC: 5.69 MIL/uL — ABNORMAL HIGH (ref 3.87–5.11)
RDW: 18.9 % — ABNORMAL HIGH (ref 11.5–15.5)
WBC: 8 10*3/uL (ref 4.0–10.5)
nRBC: 0 % (ref 0.0–0.2)

## 2020-02-05 LAB — HEMOGLOBIN A1C
Hgb A1c MFr Bld: 5.9 % — ABNORMAL HIGH (ref 4.8–5.6)
Mean Plasma Glucose: 122.63 mg/dL

## 2020-02-05 NOTE — Consult Note (Signed)
[place] Nurse ostomy consult note  [place] Nurse requested for preoperative stoma site marking by Dr. Dema Severin  Discussed surgical procedure and stoma creation with patient. She became tearful at the mention of a possible ostomy.  Explained role of the [place] nurse team.  Answered patient questions.   Examined patient sitting and standing in order to place the marking in the patient's visual field, away from any creases or abdominal contour issues and within the rectus muscle. Patient has a soft and obese abdomen.   Marked for colostomy in the LUQ  9cm to the left of the umbilicus and 3cm above the umbilicus.  Marked for ileostomy in the RUQ  7.5cm to the right of the umbilicus and  4cm above the umbilicus.  Patient's abdomen cleansed with CHG wipes at site markings, allowed to air dry prior to marking.Covered marks with thin film transparent dressing to preserve mark until date of surgery (02/13/20).   [place] Nurse team will follow up with patient after surgery for continue ostomy care and teaching. If an ostomy is created intraoperatively, please reconsult.   Thanks, Maudie Flakes, MSN, RN, [place], Arther Abbott  Pager# [phone]

## 2020-02-05 NOTE — Progress Notes (Signed)
COVID Vaccine Completed:No Date COVID Vaccine completed: COVID vaccine manufacturer: [place]   PCP - Minette Brine.: FNP LOV: 07/24/19 Cardiologist -   Chest x-ray - 07/12/19 EKG -  Stress Test -  ECHO -  Cardiac Cath -   Sleep Study -  CPAP -   Fasting Blood Sugar -  Checks Blood Sugar _____ times a day  Blood Thinner Instructions: Aspirin Instructions: Last Dose:  Anesthesia review: Pt's BP was elevated today at PST appointment,pt. Does not have Hx. Of high BP.She verbalized having pain score = 8 from 1 to 10 at her abdomen.  Patient denies shortness of breath, fever, cough and chest pain at PAT appointment   Patient verbalized understanding of instructions that were given to them at the PAT appointment. Patient was also instructed that they will need to review over the PAT instructions again at home before surgery.

## 2020-02-09 ENCOUNTER — Other Ambulatory Visit (HOSPITAL_COMMUNITY)
Admission: RE | Admit: 2020-02-09 | Discharge: 2020-02-09 | Disposition: A | Discharge status: Home / Self Care | Payer: BC Managed Care – PPO | Source: Ambulatory Visit | Attending: Surgery | Admitting: Surgery

## 2020-02-09 DIAGNOSIS — Z20822 Contact with and (suspected) exposure to covid-19: Secondary | ICD-10-CM | POA: Insufficient documentation

## 2020-02-09 LAB — SARS CORONAVIRUS 2 (TAT 6-24 HRS): SARS Coronavirus 2: NEGATIVE

## 2020-02-12 ENCOUNTER — Encounter (HOSPITAL_COMMUNITY): Payer: Self-pay | Admitting: Surgery

## 2020-02-12 MED ORDER — BUPIVACAINE LIPOSOME 1.3 % IJ SUSP
20.0000 mL | Freq: Once | INTRAMUSCULAR | Status: DC
  Filled 2020-02-12: qty 20

## 2020-02-12 NOTE — Anesthesia Preprocedure Evaluation (Addendum)
Anesthesia Evaluation  Patient identified by MRN, date of birth, ID band Patient awake    Reviewed: Allergy & Precautions, NPO status , Patient's Chart, lab work & pertinent test results  Airway Mallampati: II  TM Distance: >3 FB Neck ROM: Full    Dental no notable dental hx.    Pulmonary neg pulmonary ROS,    Pulmonary exam normal breath sounds clear to auscultation       Cardiovascular negative cardio ROS Normal cardiovascular exam Rhythm:Regular Rate:Normal     Neuro/Psych  Headaches, negative psych ROS   GI/Hepatic Neg liver ROS, GERD  Medicated and Controlled,Recurrent diverticulitis   Endo/Other  Obesity  Renal/GU Renal disease  negative genitourinary   Musculoskeletal  (+) Arthritis , Osteoarthritis,    Abdominal (+) + obese,   Peds  Hematology  (+) anemia ,   Anesthesia Other Findings   Reproductive/Obstetrics                            Anesthesia Physical Anesthesia Plan  ASA: III  Anesthesia Plan: General   Post-op Pain Management:    Induction: Intravenous and Cricoid pressure planned  PONV Risk Score and Plan: 4 or greater  Airway Management Planned: Oral ETT  Additional Equipment:   Intra-op Plan:   Post-operative Plan: Extubation in OR  Informed Consent: I have reviewed the patients History and Physical, chart, labs and discussed the procedure including the risks, benefits and alternatives for the proposed anesthesia with the patient or authorized representative who has indicated his/her understanding and acceptance.     Dental advisory given  Plan Discussed with: CRNA and Anesthesiologist  Anesthesia Plan Comments:        Anesthesia Quick Evaluation

## 2020-02-13 ENCOUNTER — Inpatient Hospital Stay (HOSPITAL_COMMUNITY)
Admission: RE | Admit: 2020-02-13 | Discharge: 2020-02-16 | DRG: 331 | Disposition: A | Discharge status: Home / Self Care | Payer: BC Managed Care – PPO | Attending: Surgery | Admitting: Surgery

## 2020-02-13 ENCOUNTER — Inpatient Hospital Stay (HOSPITAL_COMMUNITY): Payer: BC Managed Care – PPO | Admitting: Certified Registered Nurse Anesthetist

## 2020-02-13 ENCOUNTER — Encounter (HOSPITAL_COMMUNITY): Payer: Self-pay | Admitting: Surgery

## 2020-02-13 ENCOUNTER — Inpatient Hospital Stay (HOSPITAL_COMMUNITY): Payer: BC Managed Care – PPO | Admitting: Physician Assistant

## 2020-02-13 ENCOUNTER — Encounter (HOSPITAL_COMMUNITY)
Admission: RE | Disposition: A | Discharge status: Home / Self Care | Payer: Self-pay | Source: Home / Self Care | Attending: Surgery

## 2020-02-13 ENCOUNTER — Other Ambulatory Visit: Payer: Self-pay

## 2020-02-13 DIAGNOSIS — K5732 Diverticulitis of large intestine without perforation or abscess without bleeding: Secondary | ICD-10-CM | POA: Diagnosis present

## 2020-02-13 DIAGNOSIS — K219 Gastro-esophageal reflux disease without esophagitis: Secondary | ICD-10-CM | POA: Diagnosis present

## 2020-02-13 DIAGNOSIS — Z9049 Acquired absence of other specified parts of digestive tract: Secondary | ICD-10-CM

## 2020-02-13 DIAGNOSIS — Z20822 Contact with and (suspected) exposure to covid-19: Secondary | ICD-10-CM | POA: Diagnosis present

## 2020-02-13 DIAGNOSIS — Z6838 Body mass index (BMI) 38.0-38.9, adult: Secondary | ICD-10-CM

## 2020-02-13 DIAGNOSIS — E669 Obesity, unspecified: Secondary | ICD-10-CM | POA: Diagnosis present

## 2020-02-13 HISTORY — PX: FLEXIBLE SIGMOIDOSCOPY: SHX5431

## 2020-02-13 LAB — TYPE AND SCREEN
ABO/RH(D): B POS
Antibody Screen: NEGATIVE

## 2020-02-13 SURGERY — COLECTOMY, PARTIAL, ROBOT-ASSISTED, LAPAROSCOPIC
Anesthesia: General | Site: Abdomen

## 2020-02-13 MED ORDER — ONDANSETRON HCL 4 MG/2ML IJ SOLN
4.0000 mg | Freq: Four times a day (QID) | INTRAMUSCULAR | Status: DC | PRN
  Administered 2020-02-13: 4 mg via INTRAVENOUS
  Filled 2020-02-13: qty 2

## 2020-02-13 MED ORDER — PROPOFOL 10 MG/ML IV BOLUS
INTRAVENOUS | Status: AC
  Filled 2020-02-13: qty 40

## 2020-02-13 MED ORDER — BUPIVACAINE-EPINEPHRINE (PF) 0.25% -1:200000 IJ SOLN
INTRAMUSCULAR | Status: AC
  Filled 2020-02-13: qty 30

## 2020-02-13 MED ORDER — SODIUM CHLORIDE 0.9 % IV SOLN
2.0000 g | INTRAVENOUS | Status: AC
  Administered 2020-02-13: 2 g via INTRAVENOUS
  Filled 2020-02-13: qty 2

## 2020-02-13 MED ORDER — LABETALOL HCL 5 MG/ML IV SOLN
INTRAVENOUS | Status: AC
  Filled 2020-02-13: qty 4

## 2020-02-13 MED ORDER — SIMETHICONE 80 MG PO CHEW
40.0000 mg | CHEWABLE_TABLET | Freq: Four times a day (QID) | ORAL | Status: DC | PRN
  Administered 2020-02-13: 20:00:00 40 mg via ORAL
  Filled 2020-02-13: qty 1

## 2020-02-13 MED ORDER — LIDOCAINE 2% (20 MG/ML) 5 ML SYRINGE
INTRAMUSCULAR | Status: DC | PRN
  Administered 2020-02-13: 80 mg via INTRAVENOUS
  Administered 2020-02-13: 1.5 mg/kg/h via INTRAVENOUS

## 2020-02-13 MED ORDER — FENTANYL CITRATE (PF) 100 MCG/2ML IJ SOLN
25.0000 ug | INTRAMUSCULAR | Status: DC | PRN
  Administered 2020-02-13: 50 ug via INTRAVENOUS

## 2020-02-13 MED ORDER — METRONIDAZOLE 500 MG PO TABS
1000.0000 mg | ORAL_TABLET | ORAL | Status: DC
Start: 2020-02-13 — End: 2020-02-13

## 2020-02-13 MED ORDER — TRAMADOL HCL 50 MG PO TABS
50.0000 mg | ORAL_TABLET | Freq: Four times a day (QID) | ORAL | Status: DC | PRN
  Administered 2020-02-13 – 2020-02-15 (×5): 50 mg via ORAL
  Filled 2020-02-13 (×7): qty 1

## 2020-02-13 MED ORDER — FENTANYL CITRATE (PF) 100 MCG/2ML IJ SOLN
INTRAMUSCULAR | Status: AC
  Filled 2020-02-13: qty 2

## 2020-02-13 MED ORDER — NEOMYCIN SULFATE 500 MG PO TABS: 1000.0000 mg | ORAL_TABLET | ORAL | Status: DC

## 2020-02-13 MED ORDER — ONDANSETRON HCL 4 MG/2ML IJ SOLN
INTRAMUSCULAR | Status: DC | PRN
  Administered 2020-02-13: 4 mg via INTRAVENOUS

## 2020-02-13 MED ORDER — LIDOCAINE 2% (20 MG/ML) 5 ML SYRINGE
INTRAMUSCULAR | Status: AC
  Filled 2020-02-13: qty 10

## 2020-02-13 MED ORDER — BUPIVACAINE LIPOSOME 1.3 % IJ SUSP
INTRAMUSCULAR | Status: DC | PRN
  Administered 2020-02-13: 20 mL

## 2020-02-13 MED ORDER — ONDANSETRON HCL 4 MG/2ML IJ SOLN: 4.0000 mg | Freq: Once | INTRAMUSCULAR | Status: DC | PRN

## 2020-02-13 MED ORDER — MORPHINE SULFATE (PF) 2 MG/ML IV SOLN
2.0000 mg | INTRAVENOUS | Status: DC | PRN
  Administered 2020-02-13 – 2020-02-15 (×5): 2 mg via INTRAVENOUS
  Filled 2020-02-13 (×5): qty 1

## 2020-02-13 MED ORDER — ALVIMOPAN 12 MG PO CAPS
12.0000 mg | ORAL_CAPSULE | Freq: Two times a day (BID) | ORAL | Status: DC
  Administered 2020-02-14 – 2020-02-15 (×4): 12 mg via ORAL
  Filled 2020-02-13 (×4): qty 1

## 2020-02-13 MED ORDER — LABETALOL HCL 5 MG/ML IV SOLN
INTRAVENOUS | Status: DC | PRN
  Administered 2020-02-13: 5 mg via INTRAVENOUS
  Administered 2020-02-13 (×2): 2.5 mg via INTRAVENOUS

## 2020-02-13 MED ORDER — MIDAZOLAM HCL 5 MG/5ML IJ SOLN
INTRAMUSCULAR | Status: DC | PRN
  Administered 2020-02-13: 2 mg via INTRAVENOUS

## 2020-02-13 MED ORDER — LACTATED RINGERS IR SOLN
Status: DC | PRN
  Administered 2020-02-13: 1000 mL

## 2020-02-13 MED ORDER — MEPERIDINE HCL 50 MG/ML IJ SOLN: 6.2500 mg | INTRAMUSCULAR | Status: DC | PRN

## 2020-02-13 MED ORDER — BISACODYL 5 MG PO TBEC: 20.0000 mg | DELAYED_RELEASE_TABLET | Freq: Once | ORAL | Status: DC

## 2020-02-13 MED ORDER — ALVIMOPAN 12 MG PO CAPS
12.0000 mg | ORAL_CAPSULE | ORAL | Status: AC
  Administered 2020-02-13: 12 mg via ORAL
  Filled 2020-02-13: qty 1

## 2020-02-13 MED ORDER — BUPIVACAINE-EPINEPHRINE (PF) 0.25% -1:200000 IJ SOLN
INTRAMUSCULAR | Status: DC | PRN
  Administered 2020-02-13: 30 mL

## 2020-02-13 MED ORDER — LACTATED RINGERS IV SOLN: INTRAVENOUS | Status: DC

## 2020-02-13 MED ORDER — HEPARIN SODIUM (PORCINE) 5000 UNIT/ML IJ SOLN
5000.0000 [IU] | Freq: Three times a day (TID) | INTRAMUSCULAR | Status: DC
  Administered 2020-02-13 – 2020-02-16 (×8): 5000 [IU] via SUBCUTANEOUS
  Filled 2020-02-13 (×8): qty 1

## 2020-02-13 MED ORDER — SCOPOLAMINE 1 MG/3DAYS TD PT72
1.0000 | MEDICATED_PATCH | TRANSDERMAL | Status: DC
  Administered 2020-02-13: 1.5 mg via TRANSDERMAL

## 2020-02-13 MED ORDER — INDOCYANINE GREEN 25 MG IV SOLR
INTRAVENOUS | Status: DC | PRN
  Administered 2020-02-13: 8.75 mg via INTRAVENOUS

## 2020-02-13 MED ORDER — FENTANYL CITRATE (PF) 250 MCG/5ML IJ SOLN
INTRAMUSCULAR | Status: AC
  Filled 2020-02-13: qty 5

## 2020-02-13 MED ORDER — PROPOFOL 10 MG/ML IV BOLUS
INTRAVENOUS | Status: DC | PRN
  Administered 2020-02-13: 130 mg via INTRAVENOUS

## 2020-02-13 MED ORDER — LIDOCAINE HCL 2 % IJ SOLN
INTRAMUSCULAR | Status: AC
  Filled 2020-02-13: qty 20

## 2020-02-13 MED ORDER — DEXAMETHASONE SODIUM PHOSPHATE 10 MG/ML IJ SOLN
INTRAMUSCULAR | Status: DC | PRN
  Administered 2020-02-13: 10 mg via INTRAVENOUS

## 2020-02-13 MED ORDER — ROCURONIUM BROMIDE 10 MG/ML (PF) SYRINGE
PREFILLED_SYRINGE | INTRAVENOUS | Status: AC
  Filled 2020-02-13: qty 20

## 2020-02-13 MED ORDER — HEPARIN SODIUM (PORCINE) 5000 UNIT/ML IJ SOLN
5000.0000 [IU] | Freq: Once | INTRAMUSCULAR | Status: AC
  Administered 2020-02-13: 5000 [IU] via SUBCUTANEOUS
  Filled 2020-02-13: qty 1

## 2020-02-13 MED ORDER — IBUPROFEN 400 MG PO TABS
600.0000 mg | ORAL_TABLET | Freq: Four times a day (QID) | ORAL | Status: DC | PRN
  Administered 2020-02-14 – 2020-02-16 (×5): 600 mg via ORAL
  Filled 2020-02-13 (×6): qty 1

## 2020-02-13 MED ORDER — FENTANYL CITRATE (PF) 100 MCG/2ML IJ SOLN
INTRAMUSCULAR | Status: DC | PRN
  Administered 2020-02-13 (×3): 50 ug via INTRAVENOUS
  Administered 2020-02-13: 100 ug via INTRAVENOUS

## 2020-02-13 MED ORDER — ALUM & MAG HYDROXIDE-SIMETH 200-200-20 MG/5ML PO SUSP: 30.0000 mL | Freq: Four times a day (QID) | ORAL | Status: DC | PRN

## 2020-02-13 MED ORDER — KETAMINE HCL 10 MG/ML IJ SOLN
INTRAMUSCULAR | Status: DC | PRN
Start: 2020-02-13 — End: 2020-02-13
  Administered 2020-02-13 (×2): 10 mg via INTRAVENOUS
  Administered 2020-02-13: 20 mg via INTRAVENOUS

## 2020-02-13 MED ORDER — CHLORHEXIDINE GLUCONATE CLOTH 2 % EX PADS: 6.0000 | MEDICATED_PAD | Freq: Once | CUTANEOUS | Status: DC

## 2020-02-13 MED ORDER — ONDANSETRON HCL 4 MG/2ML IJ SOLN
INTRAMUSCULAR | Status: AC
  Filled 2020-02-13: qty 4

## 2020-02-13 MED ORDER — ENSURE SURGERY PO LIQD
237.0000 mL | Freq: Two times a day (BID) | ORAL | Status: DC
  Administered 2020-02-14 – 2020-02-15 (×3): 237 mL via ORAL

## 2020-02-13 MED ORDER — MIDAZOLAM HCL 2 MG/2ML IJ SOLN
INTRAMUSCULAR | Status: AC
  Filled 2020-02-13: qty 2

## 2020-02-13 MED ORDER — ONDANSETRON HCL 4 MG PO TABS
4.0000 mg | ORAL_TABLET | Freq: Four times a day (QID) | ORAL | Status: DC | PRN
  Administered 2020-02-14: 4 mg via ORAL
  Filled 2020-02-13: qty 1

## 2020-02-13 MED ORDER — 0.9 % SODIUM CHLORIDE (POUR BTL) OPTIME
TOPICAL | Status: DC | PRN
  Administered 2020-02-13: 2000 mL

## 2020-02-13 MED ORDER — ACETAMINOPHEN 500 MG PO TABS
1000.0000 mg | ORAL_TABLET | Freq: Four times a day (QID) | ORAL | Status: DC
  Administered 2020-02-13 – 2020-02-16 (×10): 1000 mg via ORAL
  Filled 2020-02-13 (×12): qty 2

## 2020-02-13 MED ORDER — SCOPOLAMINE 1 MG/3DAYS TD PT72
MEDICATED_PATCH | TRANSDERMAL | Status: AC
  Filled 2020-02-13: qty 1

## 2020-02-13 MED ORDER — POLYETHYLENE GLYCOL 3350 17 GM/SCOOP PO POWD
1.0000 | Freq: Once | ORAL | Status: DC
Start: 2020-02-13 — End: 2020-02-13

## 2020-02-13 MED ORDER — DIPHENHYDRAMINE HCL 12.5 MG/5ML PO ELIX
12.5000 mg | ORAL_SOLUTION | Freq: Four times a day (QID) | ORAL | Status: DC | PRN
  Filled 2020-02-13: qty 5

## 2020-02-13 MED ORDER — DIPHENHYDRAMINE HCL 50 MG/ML IJ SOLN: 12.5000 mg | Freq: Four times a day (QID) | INTRAMUSCULAR | Status: DC | PRN

## 2020-02-13 MED ORDER — KETAMINE HCL 10 MG/ML IJ SOLN
INTRAMUSCULAR | Status: AC
  Filled 2020-02-13: qty 1

## 2020-02-13 MED ORDER — FAMOTIDINE 20 MG PO TABS
20.0000 mg | ORAL_TABLET | Freq: Two times a day (BID) | ORAL | Status: DC
  Administered 2020-02-13 – 2020-02-16 (×6): 20 mg via ORAL
  Filled 2020-02-13 (×6): qty 1

## 2020-02-13 MED ORDER — SUGAMMADEX SODIUM 200 MG/2ML IV SOLN
INTRAVENOUS | Status: DC | PRN
  Administered 2020-02-13: 200 mg via INTRAVENOUS

## 2020-02-13 MED ORDER — ROCURONIUM BROMIDE 10 MG/ML (PF) SYRINGE
PREFILLED_SYRINGE | INTRAVENOUS | Status: DC | PRN
  Administered 2020-02-13: 10 mg via INTRAVENOUS
  Administered 2020-02-13: 5 mg via INTRAVENOUS
  Administered 2020-02-13: 70 mg via INTRAVENOUS

## 2020-02-13 MED ORDER — AMLODIPINE BESYLATE 5 MG PO TABS
5.0000 mg | ORAL_TABLET | Freq: Every day | ORAL | Status: DC
  Administered 2020-02-13 – 2020-02-16 (×4): 5 mg via ORAL
  Filled 2020-02-13 (×5): qty 1

## 2020-02-13 MED ORDER — ACETAMINOPHEN 500 MG PO TABS
1000.0000 mg | ORAL_TABLET | ORAL | Status: AC
  Administered 2020-02-13: 1000 mg via ORAL
  Filled 2020-02-13: qty 2

## 2020-02-13 MED ORDER — DEXAMETHASONE SODIUM PHOSPHATE 10 MG/ML IJ SOLN
INTRAMUSCULAR | Status: AC
  Filled 2020-02-13: qty 2

## 2020-02-13 SURGICAL SUPPLY — 104 items
APPLIER CLIP 5 13 M/L LIGAMAX5 (MISCELLANEOUS)
APPLIER CLIP ROT 10 11.4 M/L (STAPLE)
BLADE EXTENDED COATED 6.5IN (ELECTRODE) ×3 IMPLANT
CANNULA REDUC XI 12-8 STAPL (CANNULA) ×3
CANNULA REDUCER 12-8 DVNC XI (CANNULA) ×2 IMPLANT
CELLS DAT CNTRL 66122 CELL SVR (MISCELLANEOUS) IMPLANT
CHLORAPREP W/TINT 26 (MISCELLANEOUS) IMPLANT
CLIP APPLIE 5 13 M/L LIGAMAX5 (MISCELLANEOUS) IMPLANT
CLIP APPLIE ROT 10 11.4 M/L (STAPLE) IMPLANT
CLIP VESOLOCK LG 6/CT PURPLE (CLIP) IMPLANT
CLIP VESOLOCK MED LG 6/CT (CLIP) IMPLANT
COVER SURGICAL LIGHT HANDLE (MISCELLANEOUS) ×6 IMPLANT
COVER TIP SHEARS 8 DVNC (MISCELLANEOUS) IMPLANT
COVER TIP SHEARS 8MM DA VINCI (MISCELLANEOUS)
COVER WAND RF STERILE (DRAPES) IMPLANT
DECANTER SPIKE VIAL GLASS SM (MISCELLANEOUS) ×3 IMPLANT
DERMABOND ADVANCED (GAUZE/BANDAGES/DRESSINGS) ×1
DERMABOND ADVANCED .7 DNX12 (GAUZE/BANDAGES/DRESSINGS) ×2 IMPLANT
DEVICE TROCAR PUNCTURE CLOSURE (ENDOMECHANICALS) IMPLANT
DRAIN CHANNEL 19F RND (DRAIN) IMPLANT
DRAPE ARM DVNC X/XI (DISPOSABLE) ×8 IMPLANT
DRAPE COLUMN DVNC XI (DISPOSABLE) ×2 IMPLANT
DRAPE DA VINCI XI ARM (DISPOSABLE) ×12
DRAPE DA VINCI XI COLUMN (DISPOSABLE) ×3
DRAPE SURG IRRIG POUCH 19X23 (DRAPES) ×3 IMPLANT
DRSG OPSITE POSTOP 4X10 (GAUZE/BANDAGES/DRESSINGS) IMPLANT
DRSG OPSITE POSTOP 4X6 (GAUZE/BANDAGES/DRESSINGS) IMPLANT
DRSG OPSITE POSTOP 4X8 (GAUZE/BANDAGES/DRESSINGS) IMPLANT
DRSG TEGADERM 2-3/8X2-3/4 SM (GAUZE/BANDAGES/DRESSINGS) IMPLANT
DRSG TEGADERM 4X4.75 (GAUZE/BANDAGES/DRESSINGS) IMPLANT
ELECT REM PT RETURN 15FT ADLT (MISCELLANEOUS) ×3 IMPLANT
ENDOLOOP SUT PDS II  0 18 (SUTURE)
ENDOLOOP SUT PDS II 0 18 (SUTURE) IMPLANT
EVACUATOR SILICONE 100CC (DRAIN) IMPLANT
GAUZE SPONGE 2X2 8PLY STRL LF (GAUZE/BANDAGES/DRESSINGS) IMPLANT
GAUZE SPONGE 4X4 12PLY STRL (GAUZE/BANDAGES/DRESSINGS) IMPLANT
GLOVE BIO SURGEON STRL SZ7.5 (GLOVE) ×9 IMPLANT
GLOVE INDICATOR 8.0 STRL GRN (GLOVE) ×9 IMPLANT
GOWN STRL REUS W/TWL XL LVL3 (GOWN DISPOSABLE) ×21 IMPLANT
GRASPER SUT TROCAR 14GX15 (MISCELLANEOUS) IMPLANT
HOLDER FOLEY CATH W/STRAP (MISCELLANEOUS) ×3 IMPLANT
KIT PROCEDURE DA VINCI SI (MISCELLANEOUS) ×3
KIT PROCEDURE DVNC SI (MISCELLANEOUS) ×2 IMPLANT
KIT TURNOVER KIT A (KITS) IMPLANT
NEEDLE INSUFFLATION 14GA 120MM (NEEDLE) ×3 IMPLANT
PACK CARDIOVASCULAR III (CUSTOM PROCEDURE TRAY) ×3 IMPLANT
PACK COLON (CUSTOM PROCEDURE TRAY) ×3 IMPLANT
PAD POSITIONING PINK XL (MISCELLANEOUS) ×3 IMPLANT
PENCIL SMOKE EVACUATOR (MISCELLANEOUS) IMPLANT
PORT LAP GEL ALEXIS MED 5-9CM (MISCELLANEOUS) ×3 IMPLANT
PROTECTOR NERVE ULNAR (MISCELLANEOUS) ×3 IMPLANT
RELOAD STAPLER 4.3X60 GRN DVNC (STAPLE) ×2 IMPLANT
RTRCTR WOUND ALEXIS 18CM MED (MISCELLANEOUS)
SCISSORS LAP 5X35 DISP (ENDOMECHANICALS) ×3 IMPLANT
SEAL CANN UNIV 5-8 DVNC XI (MISCELLANEOUS) ×8 IMPLANT
SEAL XI 5MM-8MM UNIVERSAL (MISCELLANEOUS) ×12
SEALER VESSEL DA VINCI XI (MISCELLANEOUS) ×3
SEALER VESSEL EXT DVNC XI (MISCELLANEOUS) ×2 IMPLANT
SET IRRIG TUBING LAPAROSCOPIC (IRRIGATION / IRRIGATOR) ×3 IMPLANT
SLEEVE ADV FIXATION 5X100MM (TROCAR) IMPLANT
SOLUTION ELECTROLUBE (MISCELLANEOUS) ×3 IMPLANT
SPONGE GAUZE 2X2 STER 10/PKG (GAUZE/BANDAGES/DRESSINGS)
STAPLER 45 BLU RELOAD XI (STAPLE) IMPLANT
STAPLER 45 BLUE RELOAD XI (STAPLE)
STAPLER 45 GREEN RELOAD XI (STAPLE)
STAPLER 45 GRN RELOAD XI (STAPLE) IMPLANT
STAPLER 60 DA VINCI SURE FORM (STAPLE) ×3
STAPLER 60 SUREFORM DVNC (STAPLE) ×2 IMPLANT
STAPLER CANNULA SEAL DVNC XI (STAPLE) ×2 IMPLANT
STAPLER CANNULA SEAL XI (STAPLE) ×3
STAPLER ECHELON POWER CIR 29 (STAPLE) ×3 IMPLANT
STAPLER RELOAD 4.3X60 GREEN (STAPLE) ×3
STAPLER RELOAD 4.3X60 GRN DVNC (STAPLE) ×2
STAPLER SHEATH (SHEATH)
STAPLER SHEATH ENDOWRIST DVNC (SHEATH) IMPLANT
STOPCOCK 4 WAY LG BORE MALE ST (IV SETS) ×6 IMPLANT
SURGILUBE 2OZ TUBE FLIPTOP (MISCELLANEOUS) ×3 IMPLANT
SUT MNCRL AB 4-0 PS2 18 (SUTURE) ×6 IMPLANT
SUT PDS AB 1 CT1 27 (SUTURE) IMPLANT
SUT PDS AB 1 TP1 96 (SUTURE) IMPLANT
SUT PROLENE 0 CT 2 (SUTURE) IMPLANT
SUT PROLENE 2 0 KS (SUTURE) ×3 IMPLANT
SUT PROLENE 2 0 SH DA (SUTURE) IMPLANT
SUT SILK 2 0 (SUTURE)
SUT SILK 2 0 SH CR/8 (SUTURE) IMPLANT
SUT SILK 2-0 18XBRD TIE 12 (SUTURE) IMPLANT
SUT SILK 3 0 (SUTURE) ×3
SUT SILK 3 0 SH CR/8 (SUTURE) ×3 IMPLANT
SUT SILK 3-0 18XBRD TIE 12 (SUTURE) ×2 IMPLANT
SUT V-LOC BARB 180 2/0GR6 GS22 (SUTURE)
SUT VIC AB 3-0 SH 18 (SUTURE) IMPLANT
SUT VIC AB 3-0 SH 27 (SUTURE)
SUT VIC AB 3-0 SH 27XBRD (SUTURE) IMPLANT
SUT VICRYL 0 UR6 27IN ABS (SUTURE) ×3 IMPLANT
SUTURE V-LC BRB 180 2/0GR6GS22 (SUTURE) IMPLANT
SYR 10ML LL (SYRINGE) ×3 IMPLANT
SYS LAPSCP GELPORT 120MM (MISCELLANEOUS)
SYSTEM LAPSCP GELPORT 120MM (MISCELLANEOUS) IMPLANT
TAPE UMBILICAL COTTON 1/8X30 (MISCELLANEOUS) IMPLANT
TOWEL OR NON WOVEN STRL DISP B (DISPOSABLE) ×3 IMPLANT
TRAY FOLEY MTR SLVR 14FR STAT (SET/KITS/TRAYS/PACK) ×3 IMPLANT
TROCAR ADV FIXATION 5X100MM (TROCAR) ×3 IMPLANT
TUBING CONNECTING 10 (TUBING) ×6 IMPLANT
TUBING INSUFFLATION 10FT LAP (TUBING) ×3 IMPLANT

## 2020-02-13 NOTE — Anesthesia Postprocedure Evaluation (Signed)
Anesthesia Post Note  Patient: Katie Woodard  Procedure(s) Performed: XI ROBOT ASSISTED SIGMOIDECTOMY (N/A Abdomen) FLEXIBLE SIGMOIDOSCOPY (N/A )     Patient location during evaluation: PACU Anesthesia Type: General Level of consciousness: awake and alert and oriented Pain management: pain level controlled Vital Signs Assessment: post-procedure vital signs reviewed and stable Respiratory status: spontaneous breathing, nonlabored ventilation and respiratory function stable Cardiovascular status: blood pressure returned to baseline and stable Postop Assessment: no apparent nausea or vomiting Anesthetic complications: no   No complications documented.  Last Vitals:  Vitals:   02/13/20 1245 02/13/20 1313  BP: (!) 160/100 (!) 165/104  Pulse: 73 68  Resp: (!) 23 20  Temp: 36.6 C (!) 36.4 C  SpO2: 95% 100%    Last Pain:  Vitals:   02/13/20 1313  TempSrc: Oral  PainSc:                  Jarelyn Bambach A.

## 2020-02-13 NOTE — Progress Notes (Signed)
rm [place].,s/p Sigmoidectomy by Dr. Deland Pretty today. Pt. BP is 173/98. pulse 80. Took Norvasc today.  monitoring. Pain medication given

## 2020-02-13 NOTE — Consult Note (Signed)
Patient marked at PAT visit 02/05/20.  Will update orders and follow up post op for needs for ostomy teaching if appropriate.  [place], [place], [place]

## 2020-02-13 NOTE — Transfer of Care (Signed)
Immediate Anesthesia Transfer of Care Note  Patient: Katie Woodard  Procedure(s) Performed: Procedure(s): XI ROBOT ASSISTED SIGMOIDECTOMY (N/A) FLEXIBLE SIGMOIDOSCOPY (N/A)  Patient Location: PACU  Anesthesia Type:General  Level of Consciousness:  sedated, patient cooperative and responds to stimulation  Airway & Oxygen Therapy:Patient Spontanous Breathing and Patient connected to face mask oxgen  Post-op Assessment:  Report given to PACU RN and Post -op Vital signs reviewed and stable  Post vital signs:  Reviewed and stable  Last Vitals:  Vitals:   02/13/20 0647 02/13/20 1207  BP: (!) 151/98 (!) 163/108  Pulse: 79 78  Resp: 17 17  Temp: 36.7 C 37.3 C  SpO2: 699% 96%    Complications: No apparent anesthesia complications

## 2020-02-13 NOTE — Op Note (Signed)
02/13/2020  11:55 AM  PATIENT:  Katie Woodard  54 y.o. female  Patient Care Team: Minette Brine, [place] as PCP - General (General Practice)  PRE-OPERATIVE DIAGNOSIS:  Recurrent diverticulitis  POST-OPERATIVE DIAGNOSIS:  Same  PROCEDURE: 1. Robotic sigmoidectomy with double stapled colorectal anastomosis 2. Flexible sigmoidoscopy 3. Bilateral transversus abdominus plane blocks  SURGEON:  Sharon Mt. Maria Coin, MD  ASSISTANT: Leighton Ruff, MD  ANESTHESIA:   local and general  COUNTS:  Sponge, needle and instrument counts were reported correct x2 at the conclusion of the operation.  EBL: 50 mL  DRAINS: None  SPECIMEN: Rectosigmoid colon - open end proximal  COMPLICATIONS: None  FINDINGS: Thickened diseased segment of sigmoid colon - proximally, as expected based on CT. Sigmoidectomy carried out and a double stapled 29 mm colorectal anastomosis fashioned at 18 cm from anal verge by flexible sigmoidoscopy.  DISPOSITION: PACU in satisfactory condition  INDICATION:  Ms. Katie Woodard is a very pleasant 17yoF whom has history of recurrent diverticulitis - beginning at least 2-3 years ago. She also has history of presumed IBS. She had CT evidence of diverticulitis on multiple occasions - appearing in more proximal sigmoid colon. Colonoscopy completed with Dr. Benson Norway 08/14/19. We had met in the office and reviewed everything - she opted to proceed with surgery. Please refer to notes elsewhere for all details regarding further workup (including renal cyst) and discussions.  DESCRIPTION: The patient was identified in preop holding and taken to the OR where she was placed on the operating room table. SCDs were placed. General endotracheal anesthesia was induced without difficulty. A foley catheter was placed by the OR team under sterile conditions. She was then positioned in lithotomy with Allen stirrups. Pressure points were then evaluated and padded. She was then prepped and draped in the usual  sterile fashion. A surgical timeout was performed indicating the correct patient, procedure, positioning and need for preoperative antibiotics.   An OG tube was placed by anesthesia and confirmed to be to suction.  At Palmer's point, a stab incision was created and the Veress needle was introduced into the peritoneal cavity on the first attempt.  Intraperitoneal location was confirmed by the aspiration and saline drop test.  Pneumoperitoneum was established to a maximum pressure of 15 mmHg using CO2.  Following this, the abdomen was marked for planned trocar sites.  Just to the right and cephalad to the umbilicus, an 8 mm incision was created and an 8 mm blunt tipped robotic trocar was cautiously placed into the peritoneal cavity.  The laparoscope was inserted and demonstrated no evidence of trocar site nor Veress needle site complications.  The Veress needle was removed.  Bilateral transversus abdominis plane blocks were then created using a dilute mixture of Exparel with Marcaine.  3 additional 8 mm robotic trochars were placed under direct visualization roughly in a line extending from the right ASIS towards the left upper quadrant.  Her previous Pfannenstiel incision was identified.  The bladder was inspected and noted to be below this incision.  At this location, staying 3 fingerbreadths above the pubic symphysis, an incision was created in the 12 mm robotic trocar inserted directed cephalad into the peritoneal cavity.  This was placed under direct visualization.  An additional 5 mm assist port was placed in the right lateral abdomen under direct visualization.  The abdomen was surveyed and there was a thickened/diseased segment of sigmoid colon in the more proximal portion of the sigmoid.  There was also some omental adhesions to the  left lower quadrant of the abdomen.  She was positioned in Trendelenburg with some left side up.  Small bowel was carefully retracted out of the pelvis.  The robot was then  docked.  I then went to the console.  An omental adhesion to the left abdominal wall was taken down with the vessel sealer.  The sigmoid colon was readily identified.  She did have somewhat tortuous appearing iliac vessels.  The left and right ureter were both visible within the retroperitoneum however.  Attachments of the sigmoid colon were taken down from the intersigmoid fossa.  The rectosigmoid colon was grasped and elevated anteriorly.  Beginning with a medial to lateral approach, the peritoneum overlying the presacral space was carefully incised.  The TME plane was readily gained working in a plane between the fascia propria of the rectum and the presacral fascia.  Hypogastric nerves were seen going along the the presacral fascia and were protected free of injury.  Working more proximally, the mesorectum and sigmoid mesentery were carefully mobilized off of the peritoneum.  The left ureter was again identified and protected free of injury.  The left gonadal vessels were identified and protected.  These were both swept "down."  The superior hemorrhoidal and IMA pedicles were identified.  Attention was then turned to the lateral portion of dissection.  The colon was retracted to the right.  The sigmoid colon was fully mobilized and working proximal to the diseased segment of proximal sigmoid, the descending colon was mobilized by incising the Torrence Branagan line of Toldt.  This was done all the way up to the level of and including portions of the splenic flexure.  The associated mesocolon was also mobilized medially.  The left ureter again was confirmed to be well away for plan dissection and well away from the vasculature which had been dissected medially.  Given that this was for benign indications, a "high" ligation of the IMA was not performed.  The rectosigmoid colon was elevated anteriorly and the IMA more distally near its bifurcations into the sigmoidal vessels was ligated using the vessel sealer after  circumferential dissection and confirming the location of the left ureter.  The IMA was then divided and the stump was inspected and noted to be completely hemostatic with a good seal.  The mesentery was divided out to and just beyond the level of the diseased segment of sigmoid colon.  Working more distally, the rectum was identified where the tinea had splayed and there were loss of appendices epiploica.  This also corresponded to a location overlying the sacral promontory.  Anatomically, this clearly represents the proximal rectum.  The mesentery out to this level was then cleared using the vessel sealer.  Given that the mesentery lobe and ligated with the planned areas of resection, attention was turned to performing the perfusion test.  ICG was administered by anesthesia and at the level of the cleared mesentery, there was excellent uptake of the tracer.  The rectum was also well perfused in appearance.  There was a visible pulse in the mesentery out to the level of the cleared colon at the level of the proximal sigmoid/descending colon junction.  This colon is also supple and healthy in appearance without any thickening.  This reached into the pelvis without any difficulty and remained in that location without any tension.  A 65 mm green load robotic stapler was then placed through the 12 mm port and introduced into the peritoneal cavity.  The colon was divided at the  level of the rectum where the tinea had splayed.  The stump was intact and healthy in appearance.  A grasper was then placed on the sigmoid staple line.  Attention was then turned to the extracorporeal portion of the procedure.  The robot was undocked.  I scrubbed back in.  Using the 12 mm trocar site, a Pfannenstiel incision was created and incorporated the fascial opening through the 12 mm port site.  The rectus fascia was incised and then elevated.  The rectus muscle was mobilized free of the overlying fascia.  The peritoneum was incised  in the midline well above the location of the bladder.  An [place] wound protector was placed.  Towels were placed on the field.  The divided colon was passed through the wound protector.  The point of proximal division was identified and was again on a healthy segment of supple colon with a palpable pulse in the mesentery.  This was proximal to the diseased segment of colon.  A pursestring device was applied.  A 2-0 Prolene on a Keith needle was passed through the pursestring device.  The colon was divided and passed off with the open end being proximal.  EEA sizers were then passed and a 29 mm EEA selected.  "Belt loops" consisting of 3-0 silk were placed around the pursestring suture line.  The 29 mm anvil was placed and the pursestring tied.  There was minimal fat within the planned anastomotic staple line and no diverticula.  This was placed back into the abdomen and a cap placed over the wound protector port site.  Pneumoperitoneum was reestablished.  I then went below to pass the stapler.  My partner remained above.  EEA sizers were carefully passed up the rectum under direct visualization.  The 29 mm stapler was passed and the spike deployed just anterior to the staple line.  The components were then mated.  Orientation was confirmed such that there is no twisting of the colon nor small bowel underneath the mesenteric defect.  The stapler was then closed, held, and fired.  The donuts were inspected and noted to be complete.  The colon proximal to the anastomosis was then occluded and under direct visualization, I passed a flexible sigmoidoscope.  The pelvis had been filled with sterile saline.  The anastomosis was under water.  With good distention of the anastomosis there was no air leak or extravasation of bubbles.  The anastomosis was located at 18 cm from the anal verge by flexible sigmoidoscopy.  This anastomosis was hemostatic and well perfused in appearance.  Additionally, looking from above, there is  no tension.  Sigmoidoscope was withdrawn.  Irrigation was evacuated from the pelvis.  The pelvis and abdomen were surveyed and noted to be completely hemostatic.  Under direct visualization, all trochars were removed.  The [place] wound protector was removed.  We then switched over to clean gown/drape/instruments and placed additional sterile drapes around the field.  Sponge, needle, and instrument counts were reported correct.  The Pfannenstiel peritoneum was closed with a running 2-0 Vicryl suture.  The rectus fascia was then closed using 2 running #1 PDS sutures.  The fascia was then palpated and noted to be completely closed.  Additional anesthetic was infiltrated at the Pfannenstiel site.  Sponge, needle, and instrument counts were reported correct.  Attention was turned to closing skin.  4-0 Monocryl subcuticular suture were used to close the skin of all incision sites.  Dermabond was placed over all incisions.  A honeycomb  dressing was placed over the Pfannenstiel as well.  She was then taken out of lithotomy, awakened from anesthesia, extubated, and transferred to a stretcher for transport to PACU in satisfactory condition having tolerated the procedure well.

## 2020-02-13 NOTE — H&P (Signed)
CC: Here today for surgery  HPI: Katie Woodard is an 53 y.o. female with hx of ?IBS, recurrent diverticulitis - he reports that approximately 2-3 years ago (having problems with recurrent left lower quadrant abdominal pain. She has CT scans dating back to at least 2018 which show acute diverticulitis of the descending/proximal sigmoid colon without evidence of abscesses or perforations. Per chart review, CT scan 03/2017, 12/2017, 12/2018 (this one showing 18 x 15 mm intramural abscess), 06/2019 showed evidence of diverticulitis. She reported associated nausea/vomiting that she had these flares. She underwent colonoscopy with Dr. Benson Norway 08/14/2019 which we have a copy of the report that demonstrated diverticulosis in the sigmoid and descending colon. There is no other findings noted in his report. She was seen back in the office in follow-up. She had recovered from her most recent attack. She does report possible history of IBS and has been on Linzess for this. She reports gas-like pains in her abdomen that did seem somewhat improved with taking Linzess. She has not noticed any changes in the frequency of her diverticular symptoms. She does report that the frequency of attacks has become increasingly problematic and is now interfering with her daily quality life.  INTERVAL HX She underwent further evaluation of the left kidney with abdominal MRI which showed this to have characteristics consistent most with benign hemorrhagic cyst and addnl cyst in left kidney. She denies any changes in her health or health history since we met in the office. She has not yet had another recurrent attack of diverticulitis. She took the bowel prep well and reports everything appeared to be cleared out.  PMH: Seasonal allergies; possible IBS; recurrent diverticulitis  PSH: C-sx x4; laparoscopic appy, 01/2014 (Dr. Hulen Skains); laparoscopic cholecystectomy years prior to appendectomy  FHx: Denies FHx of  malignancy  Social: Denies use of tobacco/EtOH/drugs. Currently not working at this time  ROS: A comprehensive 10 system review of systems was completed with the patient and pertinent findings as noted above.  Past Medical History:  Diagnosis Date  . Chronic kidney disease    mases in kidney  . DDD (degenerative disc disease), cervical   . Diverticulitis   . GERD (gastroesophageal reflux disease)    Takes OTC meds  . Headache   . Ovarian cyst   . Uterine fibroid     Past Surgical History:  Procedure Laterality Date  . APPENDECTOMY    . CESAREAN SECTION  1988  . CHOLECYSTECTOMY  2005   [facility]  . LAPAROSCOPIC APPENDECTOMY N/A 01/31/2014   Procedure: APPENDECTOMY LAPAROSCOPIC;  Surgeon: Gwenyth Ober, MD;  [facility];  Service: General;  Laterality: N/A;    Family History  Problem Relation Age of Onset  . Cancer Mother        breast  . Breast cancer Mother        16s and again in her 50s  . Cancer Sister        breast x2  . Breast cancer Sister        early 58s and again in late 3s  . Cancer Maternal Aunt        ovarian    Social:  reports that she has never smoked. She has never used smokeless tobacco. She reports that she does not drink alcohol and does not use drugs.  Allergies:  Allergies  Allergen Reactions  . Dilaudid [Hydromorphone Hcl] Nausea And Vomiting and Other (See Comments)    " head spinning sensation"     Medications:  I have reviewed the patient's current medications.  No results found for this or any previous visit (from the past 48 hour(s)).  No results found.  ROS - all of the below systems have been reviewed with the patient and positives are indicated with bold text General: chills, fever or night sweats Eyes: blurry vision or double vision Hematologic/Lymphatic: bleeding problems, blood clots or swollen lymph nodes Resp: cough, shortness of breath, or wheezing CV: chest pain or dyspnea on exertion GI: as per HPI GU: dysuria,  trouble voiding, or hematuria Neuro: TIA or stroke symptoms Psych: anxiety and depression  PE Blood pressure (!) 151/98, pulse 79, temperature 98 F (36.7 C), temperature source Oral, resp. rate 17, height '4\' 11"'  (1.499 m), weight 87.1 kg, SpO2 100 %. Constitutional: NAD; conversant; wearing mask Eyes: Moist conjunctiva; no lid lag; anicteric; pupils equal/round Lungs: Normal respiratory effort CV: RRR GI: Abd soft, NT/ND Psychiatric: Appropriate affect; alert and oriented x3  No results found for this or any previous visit (from the past 48 hour(s)).  No results found.   A/P: [name] is a very pleasant 76yoF with recurrent diverticulitis - no extraluminal abscess hx but multiple recurrent attacks  -The anatomy and physiology of the GI tract was reviewed again today. The pathophysiology of diverticulitis re-reviewed as well. We discussed in setting of recurrent "uncomplicated" disease, indications for surgery being primarily based on quality of life type decisions. She currently reports that the frequency of attacks become more problematic requiring multiple trips to the hospital and is interfering with her quality of life to the point she would like to consider proceeding with surgery.  -We discussed sigmoidectomy-robotic and potential open techniques, possible splenic flexure takedown if necessary, flexible sigmoidoscopy  -The planned procedure, material risks (including, but not limited to, pain, bleeding, infection, scarring, need for blood transfusion, damage to surrounding structures- blood vessels/nerves/viscus/organs/spleen, damage to ureter, leak from anastomosis, need for additional procedures, need for stoma which could be permanent, hernia, recurrence although less common than without surgery, pneumonia, heart attack, stroke, death) benefits and alternatives to surgery were discussed at length. I noted a good probability that the procedure would help improve her symptoms by  significantly reducing the probability of recurrence. The patient's questions were answered to her satisfaction, she voiced understanding and elected to proceed with surgery. Additionally, we discussed typical postoperative expectations and the recovery process.  -CT A/P just before surgery was originally scheduled showing renal lesions not previously mentioned - her case had to be moved for further workup of these newly identified findings. MRI Abdomen 11/12/19 showed her renal cysts to appear benign with no specific follow-up recommended  Sharon Mt. Dema Severin, M.D. [facility]

## 2020-02-13 NOTE — Anesthesia Procedure Notes (Signed)
Procedure Name: Intubation Date/Time: 02/13/2020 8:35 AM Performed by: Lavina Hamman, CRNA Pre-anesthesia Checklist: Patient identified, Emergency Drugs available, Suction available, Patient being monitored and Timeout performed Patient Re-evaluated:Patient Re-evaluated prior to induction Oxygen Delivery Method: Circle system utilized Preoxygenation: Pre-oxygenation with 100% oxygen Induction Type: IV induction Ventilation: Mask ventilation without difficulty Laryngoscope Size: Mac and 3 Grade View: Grade II Tube type: Oral Tube size: 7.5 mm Number of attempts: 1 Airway Equipment and Method: Stylet Placement Confirmation: ETT inserted through vocal cords under direct vision,  positive ETCO2,  CO2 detector and breath sounds checked- equal and bilateral Secured at: 21 cm Tube secured with: Tape Dental Injury: Teeth and Oropharynx as per pre-operative assessment  Comments: ATOI

## 2020-02-14 ENCOUNTER — Encounter (HOSPITAL_COMMUNITY): Payer: Self-pay | Admitting: Surgery

## 2020-02-14 LAB — BASIC METABOLIC PANEL
Anion gap: 11 (ref 5–15)
BUN: 7 mg/dL (ref 6–20)
CO2: 24 mmol/L (ref 22–32)
Calcium: 8.8 mg/dL — ABNORMAL LOW (ref 8.9–10.3)
Chloride: 102 mmol/L (ref 98–111)
Creatinine, Ser: 0.36 mg/dL — ABNORMAL LOW (ref 0.44–1.00)
GFR calc Af Amer: >60 mL/min (ref >60)
GFR calc non Af Amer: >60 mL/min (ref >60)
Glucose, Bld: 131 mg/dL — ABNORMAL HIGH (ref 70–99)
Potassium: 3.7 mmol/L (ref 3.5–5.1)
Sodium: 137 mmol/L (ref 135–145)

## 2020-02-14 LAB — CBC
HCT: 35.2 % — ABNORMAL LOW (ref 36.0–46.0)
Hemoglobin: 10.6 g/dL — ABNORMAL LOW (ref 12.0–15.0)
MCH: 21.1 pg — ABNORMAL LOW (ref 26.0–34.0)
MCHC: 30.1 g/dL (ref 30.0–36.0)
MCV: 70.1 fL — ABNORMAL LOW (ref 80.0–100.0)
Platelets: 270 10*3/uL (ref 150–400)
RBC: 5.02 MIL/uL (ref 3.87–5.11)
RDW: 18.2 % — ABNORMAL HIGH (ref 11.5–15.5)
WBC: 10.7 10*3/uL — ABNORMAL HIGH (ref 4.0–10.5)
nRBC: 0 % (ref 0.0–0.2)

## 2020-02-14 LAB — SURGICAL PATHOLOGY

## 2020-02-14 NOTE — Progress Notes (Addendum)
Subjective No acute events. BP was elevated yesterday and given low dose amlodipine. Feeling reasonably well this morning. Some expected abdominal soreness. She denies nausea or vomiting. She has been up walking around. Foley removed this morning. Denies flatus or BM  Objective: Vital signs in last 24 hours: Temp:  [97.5 F (36.4 C)-99.1 F (37.3 C)] 98.1 F (36.7 C) (07/22 0628) Pulse Rate:  [66-84] 75 (07/22 0628) Resp:  [15-26] 16 (07/22 0628) BP: (148-178)/(72-108) 148/72 (07/22 0628) SpO2:  [95 %-100 %] 100 % (07/22 0628)    Intake/Output from previous day: 07/21 0701 - 07/22 0700 In: 3371 [P.O.:658; I.V.:2713] Out: 2150 [Urine:2000; Blood:50] Intake/Output this shift: No intake/output data recorded.  Gen: NAD, comfortable CV: RRR Pulm: Normal work of breathing Abd: Soft, appropriately ttp around incisions; no rebound nor guarding. Not significantly distended. Incisions c/d/i without erythema or drainage. Ext: SCDs in place  Lab Results: CBC  Recent Labs    02/14/20 0417  WBC 10.7*  HGB 10.6*  HCT 35.2*  PLT 270   BMET Recent Labs    02/14/20 0417  NA 137  K 3.7  CL 102  CO2 24  GLUCOSE 131*  BUN 7  CREATININE 0.36*  CALCIUM 8.8*   PT/INR No results for input(s): LABPROT, INR in the last 72 hours. ABG No results for input(s): PHART, HCO3 in the last 72 hours.  Invalid input(s): PCO2, PO2  Studies/Results:  Anti-infectives: Anti-infectives (From admission, onward)   Start     Dose/Rate Route Frequency Ordered Stop   02/13/20 1400  neomycin (MYCIFRADIN) tablet 1,000 mg  Status:  Discontinued       "And" Linked Group Details   1,000 mg Oral 3 times per day 02/13/20 0626 02/13/20 0637   02/13/20 1400  metroNIDAZOLE (FLAGYL) tablet 1,000 mg  Status:  Discontinued       "And" Linked Group Details   1,000 mg Oral 3 times per day 02/13/20 0626 02/13/20 0637   02/13/20 0630  cefoTEtan (CEFOTAN) 2 g in sodium chloride 0.9 % 100 mL IVPB        2 g 200  mL/hr over 30 Minutes Intravenous On call to O.R. 02/13/20 0626 02/13/20 0839       Assessment/Plan: Patient Active Problem List   Diagnosis Date Noted  . S/P laparoscopic-assisted sigmoidectomy 02/13/2020  . Diverticulitis of sigmoid colon 01/23/2019  . GERD (gastroesophageal reflux disease)   . Hypokalemia   . Abdominal pain 01/31/2014  . Abdominal pain, chronic, right lower quadrant 01/31/2014  . OBESITY 11/11/2009  . ABSCESS, TOOTH 11/11/2009  . DIVERTICULITIS OF COLON 02/26/2009  . RECTAL BLEEDING 02/12/2009  . ABDOMINAL PAIN, CHRONIC 02/12/2009  . Microcytic anemia 11/13/2008  . MIGRAINE HEADACHE 11/13/2008  . HEMOCCULT POSITIVE STOOL 11/13/2008  . MICROSCOPIC HEMATURIA 11/13/2008  . VAGINITIS, BACTERIAL 11/13/2008  . GERD 10/18/2008   s/p Procedure(s): XI ROBOT ASSISTED SIGMOIDECTOMY FLEXIBLE SIGMOIDOSCOPY 02/13/2020  -We spent time reviewing procedure and operative findings; answered her questions regarding all of this -Full liquids, advance to soft diet as tolerated -Ambulate 5x/day -Foley out; D/C IVF; monitor -Continue Entereg today -PPx: SQH, SCDs   LOS: 1 day   Sharon Mt. Dema Severin, M.D. [facility] Use [email or web address] to contact on call provider

## 2020-02-14 NOTE — Plan of Care (Signed)
  Problem: Pain Managment: Goal: General experience of comfort will improve 02/14/2020 0129 by Cindy Hazy, RN Outcome: Progressing 02/14/2020 0114 by Cindy Hazy, RN Outcome: Progressing

## 2020-02-15 LAB — CBC
HCT: 36.5 % (ref 36.0–46.0)
Hemoglobin: 10.5 g/dL — ABNORMAL LOW (ref 12.0–15.0)
MCH: 20.6 pg — ABNORMAL LOW (ref 26.0–34.0)
MCHC: 28.8 g/dL — ABNORMAL LOW (ref 30.0–36.0)
MCV: 71.6 fL — ABNORMAL LOW (ref 80.0–100.0)
Platelets: 234 10*3/uL (ref 150–400)
RBC: 5.1 MIL/uL (ref 3.87–5.11)
RDW: 17.9 % — ABNORMAL HIGH (ref 11.5–15.5)
WBC: 9.3 10*3/uL (ref 4.0–10.5)
nRBC: 0 % (ref 0.0–0.2)

## 2020-02-15 LAB — BASIC METABOLIC PANEL
Anion gap: 10 (ref 5–15)
BUN: 8 mg/dL (ref 6–20)
CO2: 25 mmol/L (ref 22–32)
Calcium: 8.6 mg/dL — ABNORMAL LOW (ref 8.9–10.3)
Chloride: 102 mmol/L (ref 98–111)
Creatinine, Ser: 0.41 mg/dL — ABNORMAL LOW (ref 0.44–1.00)
GFR calc Af Amer: >60 mL/min (ref >60)
GFR calc non Af Amer: >60 mL/min (ref >60)
Glucose, Bld: 96 mg/dL (ref 70–99)
Potassium: 3.5 mmol/L (ref 3.5–5.1)
Sodium: 137 mmol/L (ref 135–145)

## 2020-02-15 MED ORDER — AMLODIPINE BESYLATE 5 MG PO TABS: 5.0000 mg | ORAL_TABLET | Freq: Every day | ORAL | 1 refills | Status: DC

## 2020-02-15 MED ORDER — TRAMADOL HCL 50 MG PO TABS
50.0000 mg | ORAL_TABLET | Freq: Four times a day (QID) | ORAL | Status: DC | PRN
  Administered 2020-02-15: 20:00:00 50 mg via ORAL

## 2020-02-15 NOTE — Discharge Instructions (Signed)
POST OP INSTRUCTIONS AFTER COLON SURGERY  1. DIET: Be sure to include lots of fluids daily to stay hydrated - 64oz of water per day (8, 8 oz glasses).  Avoid fast food or heavy meals for the first couple of weeks as your are more likely to get nauseated. If you have fruits/vegetables, make sure they are cooked until soft enough to mash on the roof of your mouth and chew your food well. Otherwise, diet as tolerated.  2. Take your usually prescribed home medications unless otherwise directed. A medication has been prescribed for high blood pressure (Amlodipine). You need to follow-up with your PCP within a couple weeks of discharge to re-establish care and ensure your blood pressure is appropriately being monitored and addressed.  3. PAIN CONTROL: a. Pain is best controlled by a usual combination of three different methods TOGETHER: i. Ice/Heat ii. Over the counter pain medication iii. Prescription pain medication b. Most patients will experience some swelling and bruising around the surgical site.  Ice packs or heating pads (30-60 minutes up to 6 times a day) will help. Some people prefer to use ice alone, heat alone, alternating between ice & heat.  Experiment to what works for you.  Swelling and bruising can take several weeks to resolve.   c. It is helpful to take an over-the-counter pain medication regularly for the first few weeks: i. Ibuprofen (Motrin/Advil) - 200mg  tabs - take 3 tabs (600mg ) every 6 hours as needed for pain (unless you have been directed previously to avoid NSAIDs/ibuprofen) ii. Acetaminophen (Tylenol) - you may take 650mg  every 6 hours as needed. You can take this with motrin as they act differently on the body. If you are taking a narcotic pain medication that has acetaminophen in it, do not take over the counter tylenol at the same time. iii. NOTE: You may take both of these medications together - most patients  find it most helpful when alternating between the two (i.e.  Ibuprofen at 6am, tylenol at 9am, ibuprofen at 12pm ...) d. A  prescription for pain medication should be given to you upon discharge.  Take your pain medication as prescribed if your pain is not adequatly controlled with the over-the-counter pain reliefs mentioned above.  4. Avoid getting constipated.  Between the surgery and the pain medications, it is common to experience some constipation.  Increasing fluid intake and taking a fiber supplement (such as Metamucil, Citrucel, FiberCon, MiraLax, etc) 1-2 times a day regularly will usually help prevent this problem from occurring.  A mild laxative (prune juice, Milk of Magnesia, MiraLax, etc) should be taken according to package directions if there are no bowel movements after 48 hours.    5. Dressing: Your incisions are covered in Dermabond which is like sterile superglue for the skin. This will come off on it's own in a couple weeks. It is waterproof and you may bathe normally starting the day after your surgery in a shower. Avoid baths/pools/lakes/oceans until your wounds have fully healed. You may remove the bandage from your lower 'c-section' incision on 7/24 and keep area dry by applying clean gauze 1-2x/day  6. ACTIVITIES as tolerated:   a. Avoid heavy lifting (>10lbs or 1 gallon of milk) for the next 6 weeks. b. You may resume regular daily activities as tolerated--such as daily self-care, walking, climbing stairs--gradually increasing activities as tolerated.  If you can walk 30 minutes without difficulty, it is safe to try more intense activity such as jogging, treadmill, bicycling, low-impact aerobics.  c. DO NOT PUSH THROUGH PAIN.  Let pain be your guide: If it hurts to do something, don't do it. d. Katie Woodard may drive when you are no longer taking prescription pain medication, you can comfortably wear a seatbelt, and you can safely maneuver your car and apply brakes.   7. FOLLOW UP in our office a. Please call [facility] at [phone] to set up an  appointment to see your surgeon in the office for a follow-up appointment approximately 2 weeks after your surgery. b. Make sure that you call for this appointment the day you arrive home to insure a convenient appointment time.  9. If you have disability or family leave forms that need to be completed, you may have them completed by your primary care physician's office; for return to work instructions, please ask our office staff and they will be happy to assist you in obtaining this documentation   When to call us [phone]: 1. Poor pain control 2. Reactions / problems with new medications (rash/itching, etc)  3. Fever over 101.5 F (38.5 C) 4. Inability to urinate 5. Nausea/vomiting 6. Worsening swelling or bruising 7. Continued bleeding from incision. 8. Increased pain, redness, or drainage from the incision  The clinic staff is available to answer your questions during regular business hours (8:30am-5pm).  Please don't hesitate to call and ask to speak to one of our nurses for clinical concerns.   A surgeon from [facility] is always on call at the hospitals   If you have a medical emergency, go to the nearest emergency room or call 911.  [facility], [place] [address], [place], [place], [place]  [address] MAIN: [phone] FAX: [phone] [email or web address]

## 2020-02-15 NOTE — Progress Notes (Signed)
Subjective No acute events. In good spirits - feeling well. Denies n/v. Passing flatus, denies BM yet.Marland Kitchen Up brushing teeth this morning; walked 3 times around unit yesterday.  Objective: Vital signs in last 24 hours: Temp:  [98.1 F (36.7 C)-98.6 F (37 C)] 98.1 F (36.7 C) (07/23 0538) Pulse Rate:  [57-72] 69 (07/23 0538) Resp:  [18] 18 (07/23 0538) BP: (131-173)/(87-103) 131/87 (07/23 0538) SpO2:  [98 %-100 %] 98 % (07/23 0538)    Intake/Output from previous day: 07/22 0701 - 07/23 0700 In: 1200 [P.O.:1200] Out: 3750 [Urine:3750] Intake/Output this shift: No intake/output data recorded.  Gen: NAD, comfortable CV: RRR Pulm: Normal work of breathing Abd: Soft, appropriately ttp around incisions; no rebound nor guarding. Not distended. Incisions c/d/i without erythema or drainage. Ext: SCDs in place  Lab Results: CBC  Recent Labs    02/14/20 0417 02/15/20 0449  WBC 10.7* 9.3  HGB 10.6* 10.5*  HCT 35.2* 36.5  PLT 270 234   BMET Recent Labs    02/14/20 0417 02/15/20 0449  NA 137 137  K 3.7 3.5  CL 102 102  CO2 24 25  GLUCOSE 131* 96  BUN 7 8  CREATININE 0.36* 0.41*  CALCIUM 8.8* 8.6*   PT/INR No results for input(s): LABPROT, INR in the last 72 hours. ABG No results for input(s): PHART, HCO3 in the last 72 hours.  Invalid input(s): PCO2, PO2  Studies/Results:  Anti-infectives: Anti-infectives (From admission, onward)   Start     Dose/Rate Route Frequency Ordered Stop   02/13/20 1400  neomycin (MYCIFRADIN) tablet 1,000 mg  Status:  Discontinued       "And" Linked Group Details   1,000 mg Oral 3 times per day 02/13/20 0626 02/13/20 0637   02/13/20 1400  metroNIDAZOLE (FLAGYL) tablet 1,000 mg  Status:  Discontinued       "And" Linked Group Details   1,000 mg Oral 3 times per day 02/13/20 0626 02/13/20 0637   02/13/20 0630  cefoTEtan (CEFOTAN) 2 g in sodium chloride 0.9 % 100 mL IVPB        2 g 200 mL/hr over 30 Minutes Intravenous On call to O.R.  02/13/20 0626 02/13/20 0839       Assessment/Plan: Patient Active Problem List   Diagnosis Date Noted  . S/P laparoscopic-assisted sigmoidectomy 02/13/2020  . Diverticulitis of sigmoid colon 01/23/2019  . GERD (gastroesophageal reflux disease)   . Hypokalemia   . Abdominal pain 01/31/2014  . Abdominal pain, chronic, right lower quadrant 01/31/2014  . OBESITY 11/11/2009  . ABSCESS, TOOTH 11/11/2009  . DIVERTICULITIS OF COLON 02/26/2009  . RECTAL BLEEDING 02/12/2009  . ABDOMINAL PAIN, CHRONIC 02/12/2009  . Microcytic anemia 11/13/2008  . MIGRAINE HEADACHE 11/13/2008  . HEMOCCULT POSITIVE STOOL 11/13/2008  . MICROSCOPIC HEMATURIA 11/13/2008  . VAGINITIS, BACTERIAL 11/13/2008  . GERD 10/18/2008   s/p Procedure(s): XI ROBOT ASSISTED SIGMOIDECTOMY FLEXIBLE SIGMOIDOSCOPY 02/13/2020  -Path with expected findings - diverticulosis - reviewed this with her today -Soft diet as tolerated -Ambulate 5x/day -Continue Entereg today -PPx: SQH, SCDs -Dispo: Possible discharge home later today if doing well, good bowel fxn and comfortable with discharge today   LOS: 2 days   Sharon Mt. Dema Severin, M.D. [facility] Use [email or web address] to contact on call provider

## 2020-02-16 MED ORDER — TRAMADOL HCL 50 MG PO TABS: 50.0000 mg | ORAL_TABLET | Freq: Four times a day (QID) | ORAL | 0 refills | Status: DC | PRN

## 2020-02-16 NOTE — Progress Notes (Signed)
Patient stable, her husband and her read through the post op instructions, changed the dressing with some gauze to keep dry. She was sent home with a few dressing changes. All incisions were clean and intact. VS were taken before d/c. Patient had no concerns and was stable before d/c home.

## 2020-02-16 NOTE — Progress Notes (Signed)
Ambulated from room to nurse's station x 2 tonight.

## 2020-02-16 NOTE — Discharge Summary (Signed)
Physician Discharge Summary  Patient ID: Katie Woodard MRN: [id number] DOB/AGE: February 12, 1966 54 y.o.  Admit date: 02/13/2020 Discharge date: 02/16/2020  Admission Diagnoses: diverticular disease  Discharge Diagnoses:  Active Problems:   S/P laparoscopic-assisted sigmoidectomy   Discharged Condition: good  Hospital Course: Patient was admitted after surgery.  Her diet was advanced as tolerated.  By postop day 3 she was having good bowel function and tolerating a diet without difficulty.  Her pain was controlled with oral medications.  She was urinating well.  Consults: None  Significant Diagnostic Studies: labs: cbc, bmet  Treatments: IV hydration, analgesia: Tramadol and surgery: robotic sigmoidectomy  Discharge Exam: Blood pressure (!) 151/89, pulse 74, temperature 98.1 F (36.7 C), temperature source Oral, resp. rate 18, height 4\' 11"  (1.499 m), weight 86.1 kg, SpO2 99 %. General appearance: alert and cooperative GI: normal findings: soft, non-tender Incision/Wound:clean, dry, intact  Disposition: Discharge disposition: 01-Home or Self Care        Allergies as of 02/16/2020      Reactions   Dilaudid [hydromorphone Hcl] Nausea And Vomiting, Other (See Comments)   " head spinning sensation"       Medication List    STOP taking these medications   fluconazole 100 MG tablet Commonly known as: Diflucan   Linzess 145 MCG Caps capsule Generic drug: linaclotide   metroNIDAZOLE 500 MG tablet Commonly known as: FLAGYL   neomycin 500 MG tablet Commonly known as: MYCIFRADIN   ondansetron 4 MG disintegrating tablet Commonly known as: Zofran ODT   oxyCODONE-acetaminophen 5-325 MG tablet Commonly known as: PERCOCET/ROXICET     TAKE these medications   acetaminophen 500 MG tablet Commonly known as: TYLENOL Take 500-1,000 mg by mouth every 6 (six) hours as needed (for pain.).   amLODipine 5 MG tablet Commonly known as: NORVASC Take 1 tablet (5 mg total) by  mouth daily.   famotidine 20 MG tablet Commonly known as: PEPCID Take 20 mg by mouth 3 (three) times daily as needed for heartburn or indigestion.   traMADol 50 MG tablet Commonly known as: ULTRAM Take 1-2 tablets (50-100 mg total) by mouth every 6 (six) hours as needed (severe pain not controlled with tylenol and ibuprofen first).       Follow-up Information    Ileana Roup, MD Follow up in 3 week(s).   Specialty: General Surgery Contact information: [place] [address] [phone]               Signed: Rosario Adie 8/56/3149, 9:10 AM

## 2020-03-02 ENCOUNTER — Other Ambulatory Visit: Payer: Self-pay

## 2020-03-02 ENCOUNTER — Emergency Department (HOSPITAL_COMMUNITY)
Admission: EM | Admit: 2020-03-02 | Discharge: 2020-03-02 | Disposition: A | Discharge status: Home / Self Care | Payer: BC Managed Care – PPO | Attending: Emergency Medicine | Admitting: Emergency Medicine

## 2020-03-02 ENCOUNTER — Encounter (HOSPITAL_COMMUNITY): Payer: Self-pay | Admitting: Emergency Medicine

## 2020-03-02 ENCOUNTER — Emergency Department (HOSPITAL_BASED_OUTPATIENT_CLINIC_OR_DEPARTMENT_OTHER): Payer: BC Managed Care – PPO

## 2020-03-02 DIAGNOSIS — R0602 Shortness of breath: Secondary | ICD-10-CM

## 2020-03-02 DIAGNOSIS — N189 Chronic kidney disease, unspecified: Secondary | ICD-10-CM | POA: Insufficient documentation

## 2020-03-02 DIAGNOSIS — I809 Phlebitis and thrombophlebitis of unspecified site: Secondary | ICD-10-CM

## 2020-03-02 DIAGNOSIS — M79601 Pain in right arm: Secondary | ICD-10-CM | POA: Diagnosis present

## 2020-03-02 DIAGNOSIS — Z79899 Other long term (current) drug therapy: Secondary | ICD-10-CM | POA: Insufficient documentation

## 2020-03-02 DIAGNOSIS — M7989 Other specified soft tissue disorders: Secondary | ICD-10-CM | POA: Diagnosis not present

## 2020-03-02 LAB — CBC
HCT: 40.5 % (ref 36.0–46.0)
Hemoglobin: 12.2 g/dL (ref 12.0–15.0)
MCH: 21.2 pg — ABNORMAL LOW (ref 26.0–34.0)
MCHC: 30.1 g/dL (ref 30.0–36.0)
MCV: 70.3 fL — ABNORMAL LOW (ref 80.0–100.0)
Platelets: 392 10*3/uL (ref 150–400)
RBC: 5.76 MIL/uL — ABNORMAL HIGH (ref 3.87–5.11)
RDW: 18.9 % — ABNORMAL HIGH (ref 11.5–15.5)
WBC: 7.5 10*3/uL (ref 4.0–10.5)
nRBC: 0 % (ref 0.0–0.2)

## 2020-03-02 LAB — COMPREHENSIVE METABOLIC PANEL
ALT: 17 U/L (ref 0–44)
AST: 11 U/L — ABNORMAL LOW (ref 15–41)
Albumin: 3.9 g/dL (ref 3.5–5.0)
Alkaline Phosphatase: 62 U/L (ref 38–126)
Anion gap: 10 (ref 5–15)
BUN: 9 mg/dL (ref 6–20)
CO2: 25 mmol/L (ref 22–32)
Calcium: 9.2 mg/dL (ref 8.9–10.3)
Chloride: 104 mmol/L (ref 98–111)
Creatinine, Ser: 0.49 mg/dL (ref 0.44–1.00)
GFR calc Af Amer: >60 mL/min (ref >60)
GFR calc non Af Amer: >60 mL/min (ref >60)
Glucose, Bld: 96 mg/dL (ref 70–99)
Potassium: 3.7 mmol/L (ref 3.5–5.1)
Sodium: 139 mmol/L (ref 135–145)
Total Bilirubin: 0.5 mg/dL (ref 0.3–1.2)
Total Protein: 7.4 g/dL (ref 6.5–8.1)

## 2020-03-02 MED ORDER — CEPHALEXIN 500 MG PO CAPS
500.0000 mg | ORAL_CAPSULE | Freq: Four times a day (QID) | ORAL | 0 refills | Status: DC
Start: 2020-03-02 — End: 2020-04-29

## 2020-03-02 MED ORDER — CEPHALEXIN 250 MG PO CAPS
500.0000 mg | ORAL_CAPSULE | Freq: Once | ORAL | Status: AC
  Administered 2020-03-02: 500 mg via ORAL
  Filled 2020-03-02: qty 2

## 2020-03-02 NOTE — Discharge Instructions (Addendum)
Continue warm compresses to arm Take keflex as prescribed Ultrasound did not show evidence of dvt Recheck with your doctor this week.

## 2020-03-02 NOTE — ED Provider Notes (Signed)
[facility] EMERGENCY DEPARTMENT Provider Note   CSN: [id number] Arrival date & time: 03/02/20  3532     History Chief Complaint  Patient presents with  . Arm Pain    Katie Woodard is a 54 y.o. female.  HPI      54 year old female presents today complaining of right upper extremity pain and swelling.  She was recently hospitalized and had colon resection.  She had an IV in her right hand.  She was discharged on July 24.  Last night she began having some pain and paresthesias in her sleep.  Past Medical History:  Diagnosis Date  . Chronic kidney disease    mases in kidney  . DDD (degenerative disc disease), cervical   . Diverticulitis   . GERD (gastroesophageal reflux disease)    Takes OTC meds  . Headache   . Ovarian cyst   . Uterine fibroid     Patient Active Problem List   Diagnosis Date Noted  . S/P laparoscopic-assisted sigmoidectomy 02/13/2020  . Diverticulitis of sigmoid colon 01/23/2019  . GERD (gastroesophageal reflux disease)   . Hypokalemia   . Abdominal pain 01/31/2014  . Abdominal pain, chronic, right lower quadrant 01/31/2014  . OBESITY 11/11/2009  . ABSCESS, TOOTH 11/11/2009  . DIVERTICULITIS OF COLON 02/26/2009  . RECTAL BLEEDING 02/12/2009  . ABDOMINAL PAIN, CHRONIC 02/12/2009  . Microcytic anemia 11/13/2008  . MIGRAINE HEADACHE 11/13/2008  . HEMOCCULT POSITIVE STOOL 11/13/2008  . MICROSCOPIC HEMATURIA 11/13/2008  . VAGINITIS, BACTERIAL 11/13/2008  . GERD 10/18/2008    Past Surgical History:  Procedure Laterality Date  . APPENDECTOMY    . CESAREAN SECTION  1988  . CHOLECYSTECTOMY  2005   [facility]  . FLEXIBLE SIGMOIDOSCOPY N/A 02/13/2020   Procedure: FLEXIBLE SIGMOIDOSCOPY;  Surgeon: Ileana Roup, MD;  Location: [facility];  Service: General;  Laterality: N/A;  . LAPAROSCOPIC APPENDECTOMY N/A 01/31/2014   Procedure: APPENDECTOMY LAPAROSCOPIC;  Surgeon: Gwenyth Ober, MD;  Location: [facility];  Service: General;  Laterality:  N/A;     OB History    Gravida  4   Para  4   Term  4   Preterm      AB      Living        SAB      TAB      Ectopic      Multiple      Live Births              Family History  Problem Relation Age of Onset  . Cancer Mother        breast  . Breast cancer Mother        63s and again in her 62s  . Cancer Sister        breast x2  . Breast cancer Sister        early 12s and again in late 17s  . Cancer Maternal Aunt        ovarian    Social History   Tobacco Use  . Smoking status: Never Smoker  . Smokeless tobacco: Never Used  Vaping Use  . Vaping Use: Never used  Substance Use Topics  . Alcohol use: No  . Drug use: No    Home Medications Prior to Admission medications   Medication Sig Start Date End Date Taking? Authorizing Provider  acetaminophen (TYLENOL) 500 MG tablet Take 500-1,000 mg by mouth every 6 (six) hours as needed (for pain.).  [provider]  amLODipine (NORVASC) 5 MG tablet Take 1 tablet (5 mg total) by mouth daily. 02/15/20   Ileana Roup, MD  famotidine (PEPCID) 20 MG tablet Take 20 mg by mouth 3 (three) times daily as needed for heartburn or indigestion.    [provider]  traMADol (ULTRAM) 50 MG tablet Take 1-2 tablets (50-100 mg total) by mouth every 6 (six) hours as needed (severe pain not controlled with tylenol and ibuprofen first). 1/75/10   Leighton Ruff, MD    Allergies    Dilaudid [hydromorphone hcl]  Review of Systems   Review of Systems  Physical Exam Updated Vital Signs BP (!) 148/106 (BP Location: Left Arm)   Pulse 75   Temp 98.5 F (36.9 C) (Oral)   Resp 14   Ht 1.499 m (4\' 11" )   Wt 88.5 kg   SpO2 100%   BMI 39.39 kg/m   Physical Exam Vitals and nursing note reviewed.  HENT:     Head: Normocephalic.     Right Ear: External ear normal.     Left Ear: External ear normal.     Nose: Nose normal.     Mouth/Throat:     Pharynx: Oropharynx is clear.  Eyes:     Pupils:  Pupils are equal, round, and reactive to light.  Cardiovascular:     Rate and Rhythm: Normal rate and regular rhythm.     Pulses: Normal pulses.     Heart sounds: Normal heart sounds.  Pulmonary:     Effort: Pulmonary effort is normal.  Abdominal:     General: Bowel sounds are normal.     Palpations: Abdomen is soft.     Tenderness: There is no abdominal tenderness.  Musculoskeletal:        General: Normal range of motion.     Cervical back: Normal range of motion.     Comments: Diffuse ttp right arm proximal to wrist throughout arm to axilla, no definite swelling, redness or rash Radial pulse intact All motor- hand, wrist,elbow intact No sensory deficit  Skin:    General: Skin is warm.     Capillary Refill: Capillary refill takes less than 2 seconds.  Neurological:     General: No focal deficit present.     Mental Status: She is alert and oriented to person, place, and time.  Psychiatric:        Mood and Affect: Mood normal.        Behavior: Behavior normal.     ED Results / Procedures / Treatments   Labs (all labs ordered are listed, but only abnormal results are displayed) Labs Reviewed  CBC  COMPREHENSIVE METABOLIC PANEL    EKG None  Radiology No results found.  Procedures Procedures (including critical care time)  Medications Ordered in ED Medications - No data to display  ED Course  I have reviewed the triage vital signs and the nursing notes.  Pertinent labs & imaging results that were available during my care of the patient were reviewed by me and considered in my medical decision making (see chart for details).    MDM Rules/Calculators/A&P                          Recently hospitalized patient with pain and possible swelling of right upper extremity.  Doppler obtained and no evidence of DVT.  Plan continue warm compresses and will start antibiotics.  Labs are pending.  Likely discharge home with  outpatient follow-up.  Final Clinical Impression(s) /  ED Diagnoses Final diagnoses:  Right arm pain  Phlebitis    Rx / DC Orders ED Discharge Orders    None       Pattricia Boss, MD 03/02/20 1451

## 2020-03-02 NOTE — ED Triage Notes (Signed)
C/o R forearm itching, burning, pain, swelling, and tingling since last night.  Pt believes it may be related to IV she had 7/21-7/24.

## 2020-03-02 NOTE — ED Notes (Signed)
Pt discharge instructions and prescriptions reviewed with the patient. The patient verbalized understanding of instructions. Pt discharged. 

## 2020-03-02 NOTE — Progress Notes (Signed)
Upper extremity venous RT study completed.   See Cv Proc for preliminary results.   Katie Woodard  

## 2020-03-10 ENCOUNTER — Other Ambulatory Visit: Payer: Self-pay

## 2020-03-10 ENCOUNTER — Encounter: Payer: Self-pay | Admitting: Nurse Practitioner

## 2020-03-10 ENCOUNTER — Ambulatory Visit (INDEPENDENT_AMBULATORY_CARE_PROVIDER_SITE_OTHER): Payer: BC Managed Care – PPO | Admitting: Nurse Practitioner

## 2020-03-10 VITALS — BP 138/86 | HR 96 | Temp 98.7°F | Wt 191.0 lb

## 2020-03-10 DIAGNOSIS — Z1231 Encounter for screening mammogram for malignant neoplasm of breast: Secondary | ICD-10-CM

## 2020-03-10 DIAGNOSIS — R9389 Abnormal findings on diagnostic imaging of other specified body structures: Secondary | ICD-10-CM

## 2020-03-10 DIAGNOSIS — Z9049 Acquired absence of other specified parts of digestive tract: Secondary | ICD-10-CM | POA: Diagnosis not present

## 2020-03-10 NOTE — Progress Notes (Signed)
I,Yamilka Roman [employer] as a Education administrator for [employer], FNP.,have documented all relevant documentation on the behalf of Minette Brine, FNP,as directed by  Minette Brine, FNP while in the presence of Minette Brine, [place].  This visit occurred during the SARS-CoV-2 public health emergency.  Safety protocols were in place, including screening questions prior to the visit, additional usage of staff PPE, and extensive cleaning of exam room while observing appropriate contact time as indicated for disinfecting solutions.  Subjective:     Patient ID: Katie Woodard , female    DOB: 1966/04/15 , 54 y.o.   MRN: [id number]   Chief Complaint  Patient presents with  . post-op    patient had colon surgery on the 21st     HPI  She is here post sigmoidectomy and right arm phlebitis (1 week after her surgery).  She had 4 knots on her arms.  She was not having any pain but had swelling.  She was treated with cephalexin she has 2 pills left, has been having nausea since that time.  She is scheduled to see Dr. Dema Severin on 8/19.     She is taking stool softner and miralax. She is having hard stool.  She is not passing gas.  She has changed her diet as well  Wt Readings from Last 3 Encounters: 03/10/20 : 191 lb (86.6 kg) 03/02/20 : 195 lb (88.5 kg) 02/16/20 : 189 lb 14.4 oz (86.1 kg)       Past Medical History:  Diagnosis Date  . Chronic kidney disease    mases in kidney  . DDD (degenerative disc disease), cervical   . Diverticulitis   . GERD (gastroesophageal reflux disease)    Takes OTC meds  . Headache   . Ovarian cyst   . Uterine fibroid      Family History  Problem Relation Age of Onset  . Cancer Mother        breast  . Breast cancer Mother        67s and again in her 58s  . Cancer Sister        breast x2  . Breast cancer Sister        early 42s and again in late 53s  . Cancer Maternal Aunt        ovarian     Current Outpatient Medications:  .  acetaminophen (TYLENOL) 500 MG  tablet, Take 500-1,000 mg by mouth every 6 (six) hours as needed (for pain.)., Disp: , Rfl:  .  amLODipine (NORVASC) 5 MG tablet, Take 1 tablet (5 mg total) by mouth daily., Disp: 30 tablet, Rfl: 1 .  cephALEXin (KEFLEX) 500 MG capsule, Take 1 capsule (500 mg total) by mouth 4 (four) times daily., Disp: 20 capsule, Rfl: 0 .  famotidine (PEPCID) 20 MG tablet, Take 20 mg by mouth 3 (three) times daily as needed for heartburn or indigestion., Disp: , Rfl:  .  traMADol (ULTRAM) 50 MG tablet, Take 1-2 tablets (50-100 mg total) by mouth every 6 (six) hours as needed (severe pain not controlled with tylenol and ibuprofen first)., Disp: 30 tablet, Rfl: 0   Allergies  Allergen Reactions  . Dilaudid [Hydromorphone Hcl] Nausea And Vomiting and Other (See Comments)    " head spinning sensation"      Review of Systems  Constitutional: Negative.  Negative for fatigue.  Respiratory: Negative.   Cardiovascular: Negative.  Negative for chest pain, palpitations and leg swelling.  Gastrointestinal: Positive for constipation and nausea. Negative for  abdominal distention, abdominal pain, blood in stool and rectal pain.  Neurological: Negative for dizziness and headaches.  Psychiatric/Behavioral: Negative.      Today's Vitals   03/10/20 1159  BP: 138/86  Pulse: 96  Temp: 98.7 F (37.1 C)  TempSrc: Oral  Weight: 191 lb (86.6 kg)  PainSc: 0-No pain   Body mass index is 38.58 kg/m.   Objective:  Physical Exam Constitutional:      General: She is not in acute distress.    Appearance: Normal appearance.  Cardiovascular:     Rate and Rhythm: Normal rate and regular rhythm.     Pulses: Normal pulses.     Heart sounds: Normal heart sounds. No murmur heard.   Pulmonary:     Effort: Pulmonary effort is normal. No respiratory distress.     Breath sounds: Normal breath sounds.  Abdominal:     General: There is no distension.     Tenderness: There is abdominal tenderness (right abdomen).     Comments:  She has healing laparscopic areas to abdomen  Skin:    Capillary Refill: Capillary refill takes less than 2 seconds.  Neurological:     General: No focal deficit present.     Mental Status: She is alert and oriented to person, place, and time.     Cranial Nerves: No cranial nerve deficit.  Psychiatric:        Mood and Affect: Mood normal.        Behavior: Behavior normal.        Thought Content: Thought content normal.        Judgment: Judgment normal.         Assessment And Plan:     1. S/P laparoscopic-assisted sigmoidectomy  Overall she is doing well post op, she has also seen the surgeon this week.  She does not need any additional labs for follow up.   2. Abnormal MRI  Her MRI of the left kidney shows a cystic lesion measuring 11 mm and a second lesion measuring 1.4cm. will refer her to Nephrology for further evaluation - Ambulatory referral to Nephrology     Patient was given opportunity to ask questions. Patient verbalized understanding of the plan and was able to repeat key elements of the plan. All questions were answered to their satisfaction.  Minette Brine, FNP   I, Minette Brine, FNP, have reviewed all documentation for this visit. The documentation on 03/23/20 for the exam, diagnosis, procedures, and orders are all accurate and complete.   THE PATIENT IS ENCOURAGED TO PRACTICE SOCIAL DISTANCING DUE TO THE COVID-19 PANDEMIC.

## 2020-03-23 ENCOUNTER — Encounter: Payer: Self-pay | Admitting: Nurse Practitioner

## 2020-03-24 ENCOUNTER — Telehealth: Payer: Self-pay

## 2020-03-24 NOTE — Telephone Encounter (Signed)
I called the pt to prechart for her visit tomorrow and the pt said that she was seen on  The 16th and needed to cancel the appt.  The pt said she hasn't heard back about her kidney doctor referral.  The pt was told that the referral specialist sends the pt's records with the referral and that the kidney doctor's office would look at the pt's records and determine how soon they need to be seen.  The pt was told to call the office back if she hasn't heard form them in a couple weeks.

## 2020-03-25 ENCOUNTER — Ambulatory Visit: Payer: BC Managed Care – PPO | Admitting: Nurse Practitioner

## 2020-04-08 ENCOUNTER — Other Ambulatory Visit: Payer: Self-pay

## 2020-04-08 ENCOUNTER — Ambulatory Visit
Admission: RE | Admit: 2020-04-08 | Discharge: 2020-04-08 | Disposition: A | Discharge status: Home / Self Care | Payer: BC Managed Care – PPO | Source: Ambulatory Visit | Attending: Nurse Practitioner | Admitting: Nurse Practitioner

## 2020-04-08 DIAGNOSIS — Z1231 Encounter for screening mammogram for malignant neoplasm of breast: Secondary | ICD-10-CM

## 2020-04-21 ENCOUNTER — Other Ambulatory Visit: Payer: Self-pay | Admitting: Nurse Practitioner

## 2020-04-21 DIAGNOSIS — R9389 Abnormal findings on diagnostic imaging of other specified body structures: Secondary | ICD-10-CM

## 2020-04-29 ENCOUNTER — Ambulatory Visit (INDEPENDENT_AMBULATORY_CARE_PROVIDER_SITE_OTHER): Payer: BC Managed Care – PPO | Admitting: Nurse Practitioner

## 2020-04-29 ENCOUNTER — Encounter: Payer: Self-pay | Admitting: Nurse Practitioner

## 2020-04-29 ENCOUNTER — Other Ambulatory Visit: Payer: Self-pay

## 2020-04-29 VITALS — BP 132/80 | HR 76 | Temp 97.6°F | Ht 59.2 in | Wt 196.0 lb

## 2020-04-29 DIAGNOSIS — G8929 Other chronic pain: Secondary | ICD-10-CM

## 2020-04-29 DIAGNOSIS — R21 Rash and other nonspecific skin eruption: Secondary | ICD-10-CM | POA: Diagnosis not present

## 2020-04-29 DIAGNOSIS — M25561 Pain in right knee: Secondary | ICD-10-CM | POA: Diagnosis not present

## 2020-04-29 DIAGNOSIS — W19XXXA Unspecified fall, initial encounter: Secondary | ICD-10-CM | POA: Diagnosis not present

## 2020-04-29 NOTE — Patient Instructions (Signed)
Get some hydrocortisone cream over the counter and apply to the areas when the rash occurs.

## 2020-04-29 NOTE — Progress Notes (Signed)
I,Yamilka Roman [employer] as a Education administrator for [employer], FNP.,have documented all relevant documentation on the behalf of Minette Brine, FNP,as directed by  Minette Brine, FNP while in the presence of Minette Brine, [place].  This visit occurred during the SARS-CoV-2 public health emergency.  Safety protocols were in place, including screening questions prior to the visit, additional usage of staff PPE, and extensive cleaning of exam room while observing appropriate contact time as indicated for disinfecting solutions.  Subjective:     Patient ID: Katie Woodard , female    DOB: January 02, 1966 , 54 y.o.   MRN: [id number]   Chief Complaint  Patient presents with  . Mass    Patient has a few bumps that has been popping up    HPI  She has been having a boil underneath her skin - looks like mosquito bites. They will be itching and will have burning and pain.  Saturday morning had on her chest. Lasts about 2-3 days.  She does not currently have any areas.  Denies fever or any other symptoms.      Past Medical History:  Diagnosis Date  . Chronic kidney disease    mases in kidney  . DDD (degenerative disc disease), cervical   . Diverticulitis   . GERD (gastroesophageal reflux disease)    Takes OTC meds  . Headache   . Ovarian cyst   . Uterine fibroid      Family History  Problem Relation Age of Onset  . Cancer Mother        breast  . Breast cancer Mother        36s and again in her 17s  . Cancer Sister        breast x2  . Breast cancer Sister        early 20s and again in late 78s  . Cancer Maternal Aunt        ovarian     Current Outpatient Medications:  .  acetaminophen (TYLENOL) 500 MG tablet, Take 500-1,000 mg by mouth every 6 (six) hours as needed (for pain.)., Disp: , Rfl:  .  amLODipine (NORVASC) 5 MG tablet, Take 1 tablet (5 mg total) by mouth daily., Disp: 30 tablet, Rfl: 1 .  famotidine (PEPCID) 20 MG tablet, Take 20 mg by mouth 3 (three) times daily as needed for  heartburn or indigestion., Disp: , Rfl:  .  traMADol (ULTRAM) 50 MG tablet, Take 1-2 tablets (50-100 mg total) by mouth every 6 (six) hours as needed (severe pain not controlled with tylenol and ibuprofen first)., Disp: 30 tablet, Rfl: 0   Allergies  Allergen Reactions  . Dilaudid [Hydromorphone Hcl] Nausea And Vomiting and Other (See Comments)    " head spinning sensation"      Review of Systems  Constitutional: Negative.   Respiratory: Negative for cough.   Cardiovascular: Negative.  Negative for palpitations and leg swelling.  Musculoskeletal:       Right knee pain resulting in a fall yesterday - denies an injury  Skin: Positive for rash (slight redness to left breast ).  Psychiatric/Behavioral: Negative.      Today's Vitals   04/29/20 1049  BP: 132/80  Pulse: 76  Temp: 97.6 F (36.4 C)  TempSrc: Oral  Weight: 196 lb (88.9 kg)  Height: 4' 11.2" (1.504 m)  PainSc: 0-No pain   Body mass index is 39.32 kg/m.   Objective:  Physical Exam Constitutional:      General: She is not in acute  distress.    Appearance: Normal appearance.  Skin:    Capillary Refill: Capillary refill takes less than 2 seconds.  Neurological:     General: No focal deficit present.     Mental Status: She is alert and oriented to person, place, and time.  Psychiatric:        Mood and Affect: Mood normal.        Behavior: Behavior normal.        Thought Content: Thought content normal.        Judgment: Judgment normal.         Assessment And Plan:     1. Rash and nonspecific skin eruption  No rash noted at this time, unsure of the type of rash she may be having but may be a dermatitis type rash  2. Chronic pain of right knee  She is having progressive pain to her right knee and would like a referral to orthopedics - Ambulatory referral to Orthopedic Surgery  3. Fall, initial encounter  She had a fall a few days ago with minimal injury but may have flared her pain to her right knee.  Will refer to orthopedics she may need to have physical therapy     Patient was given opportunity to ask questions. Patient verbalized understanding of the plan and was able to repeat key elements of the plan. All questions were answered to their satisfaction.   Teola Bradley, FNP, have reviewed all documentation for this visit. The documentation on 05/12/20 for the exam, diagnosis, procedures, and orders are all accurate and complete.  THE PATIENT IS ENCOURAGED TO PRACTICE SOCIAL DISTANCING DUE TO THE COVID-19 PANDEMIC.

## 2020-05-15 ENCOUNTER — Telehealth: Payer: Self-pay

## 2020-05-15 NOTE — Telephone Encounter (Signed)
lvm for pt to return call. Returning call to get more information about the vm she left on 05/15/20 about paperwork

## 2020-05-15 NOTE — Telephone Encounter (Signed)
The pt was asked to clarify what is needed and the pt said that her employer said that the information on the form is different from last month and for the pt to have her provider to send in what was filled out last month and to change the date.

## 2020-05-26 ENCOUNTER — Encounter: Payer: Self-pay | Admitting: Nurse Practitioner

## 2020-05-26 ENCOUNTER — Telehealth: Payer: Self-pay | Admitting: Nurse Practitioner

## 2020-05-26 DIAGNOSIS — R1030 Lower abdominal pain, unspecified: Secondary | ICD-10-CM

## 2020-05-26 NOTE — Telephone Encounter (Signed)
Patient called to inform we will need an original copy of the form she needs for work to complete. She will bring by the office tomorrow.  I also discussed with her the area on her skin that she sent a picture of appears to look like a "whelp" she has had her housed checked for insects, she denies using any cream or taking any medications at night only. She will take benadryl with relief.  I have also placed another referral for gyn for the lower abdomen pain she has had, she did not go to the previous referral due to missing the phone call.

## 2020-05-28 ENCOUNTER — Telehealth: Payer: Self-pay | Admitting: Nurse Practitioner

## 2020-05-28 IMAGING — CT CT ABD-PELV W/ CM
2 of 5 series · 16 of 46 positions shown, 18 images · IV contrast (Omni 300)
Comparison: 01/23/2019

CLINICAL DATA: Abdominal pain for 4 days. Concern for a flare up of
diverticulitis.

EXAM:
CT ABDOMEN AND PELVIS WITH CONTRAST
TECHNIQUE: Multidetector CT imaging of the abdomen and pelvis was performed
using the standard protocol following bolus administration of
intravenous contrast.
CONTRAST:  100mL OMNIPAQUE IOHEXOL 300 MG/ML  SOLN

[Series 2: a/p w/ 5mm · axial · 0.98mm/px · z∈[+155,+560]mm · 13 of 91 slices shown, 15 images]
[im 5/91  soft-tissue]
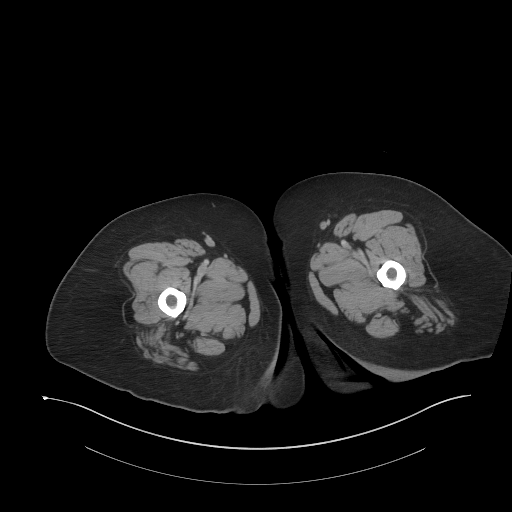
[im 5/91  bone]
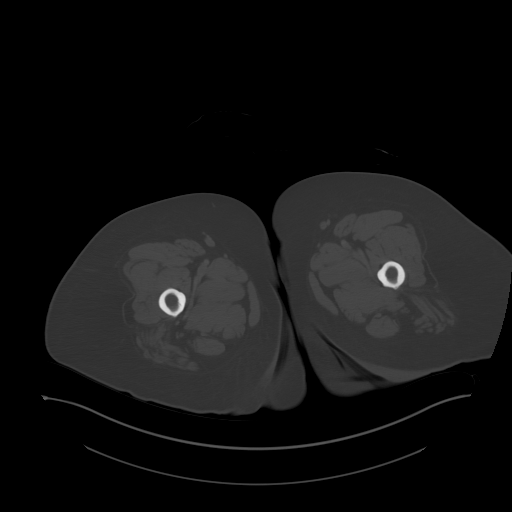
[im 14/91  soft-tissue]
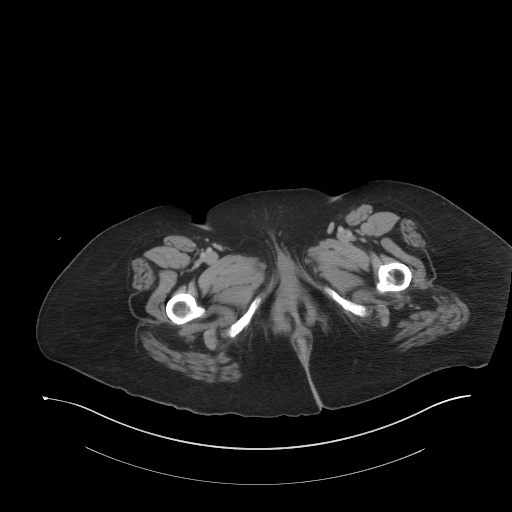
[im 19/91  soft-tissue]
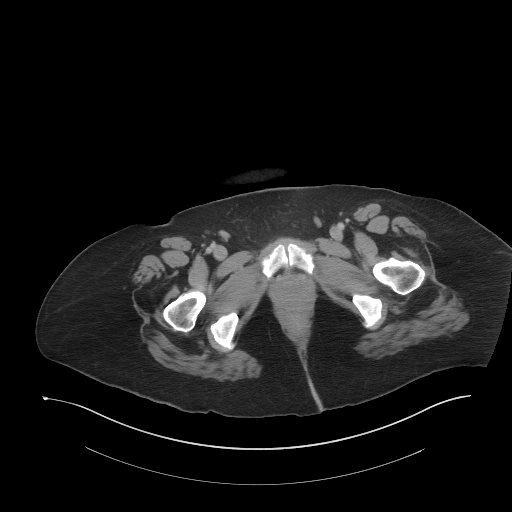
[im 28/91  soft-tissue]
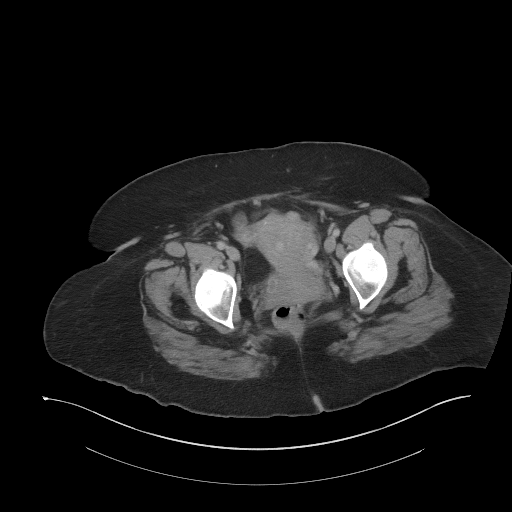
[im 32/91  soft-tissue]
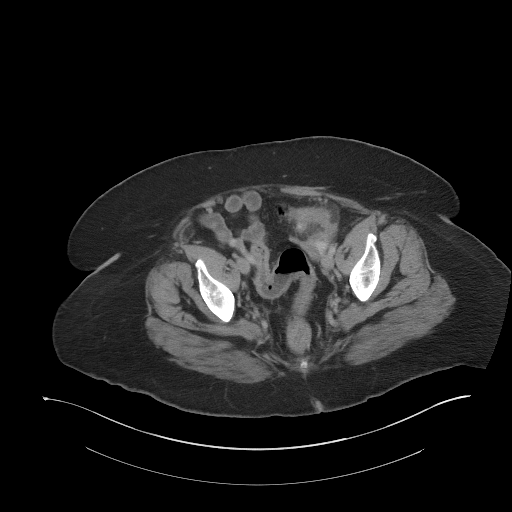
[im 41/91  soft-tissue]
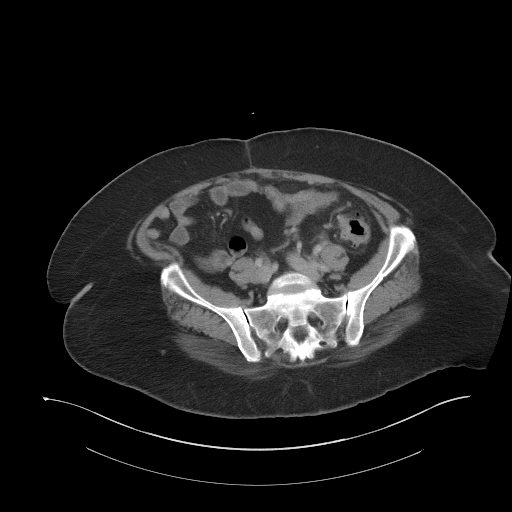
[im 46/91  soft-tissue]
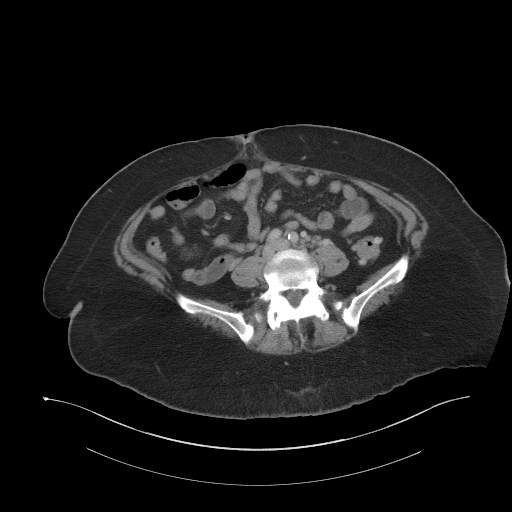
[im 50/91  soft-tissue]
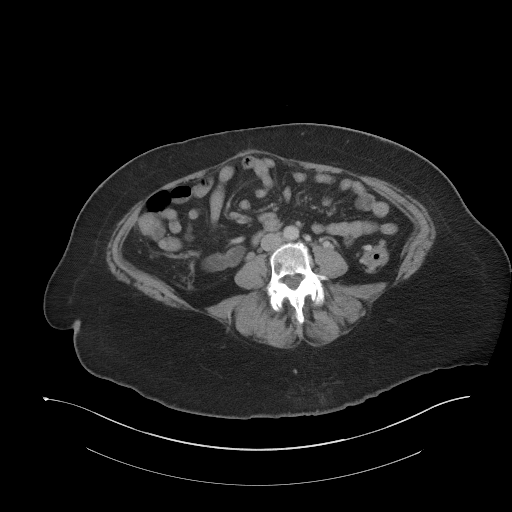
[im 59/91  soft-tissue]
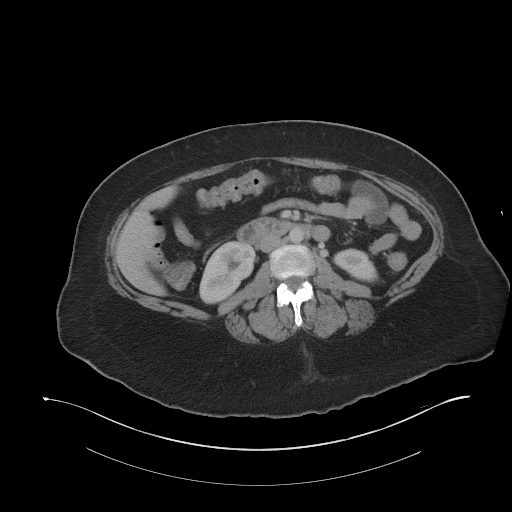
[im 59/91  bone]
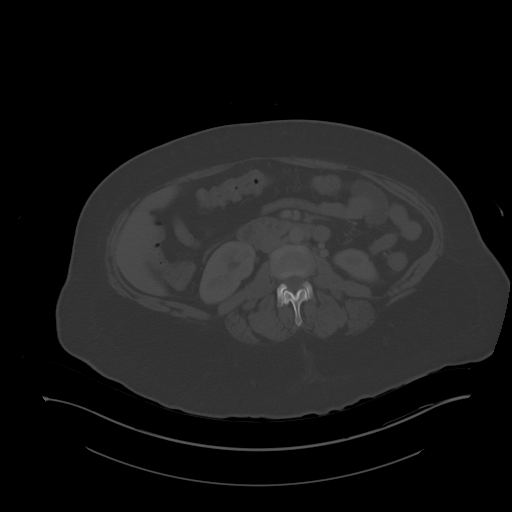
[im 64/91  soft-tissue]
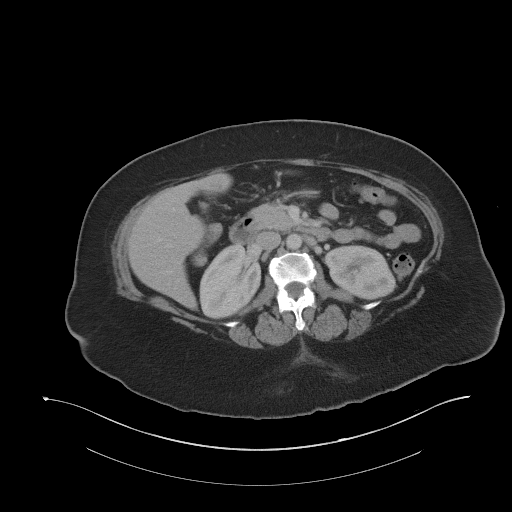
[im 73/91  soft-tissue]
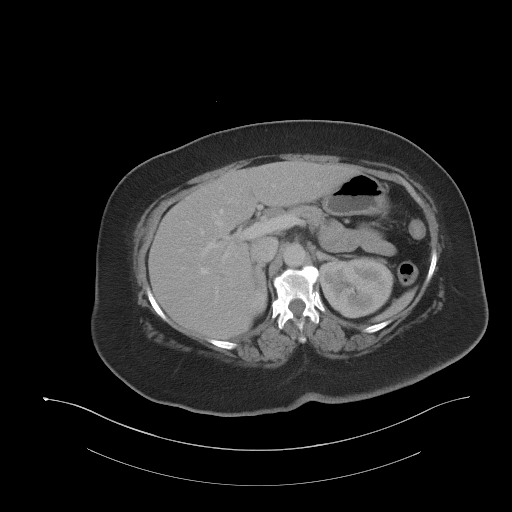
[im 77/91  soft-tissue]
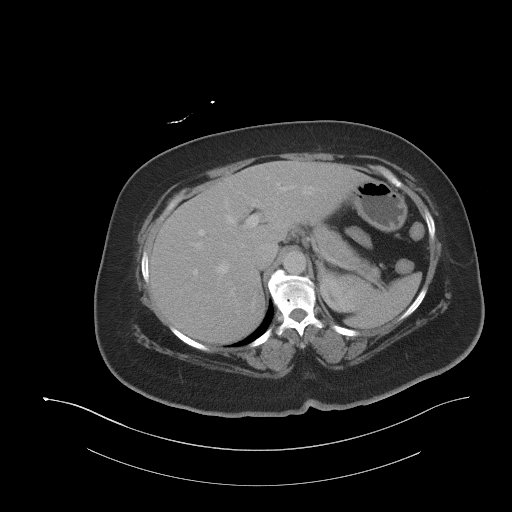
[im 86/91  soft-tissue]
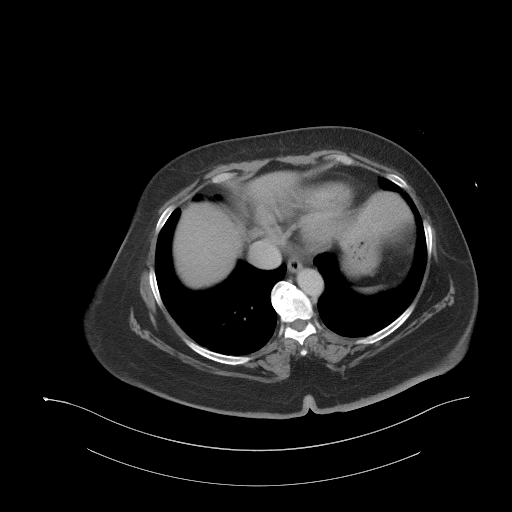

[Series 5: a/p w/ cor · coronal · 0.88mm/px · 3 of 164 slices shown]
[im 55/164  soft-tissue]
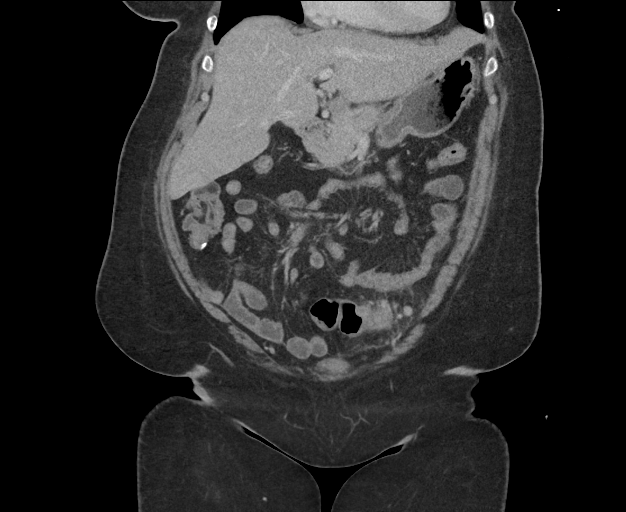
[im 73/164  soft-tissue]
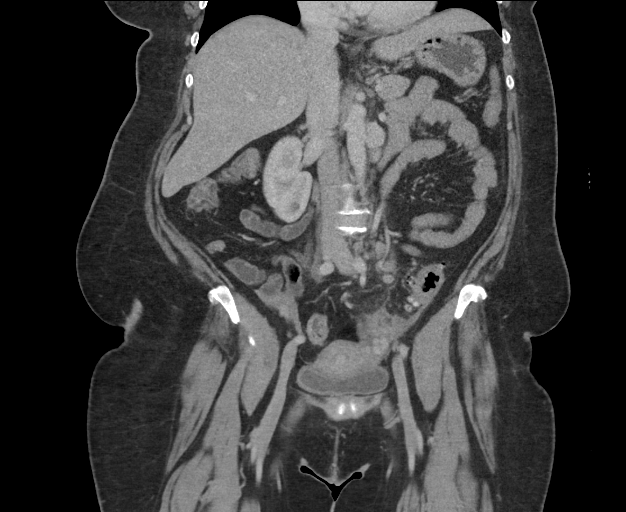
[im 91/164  soft-tissue]
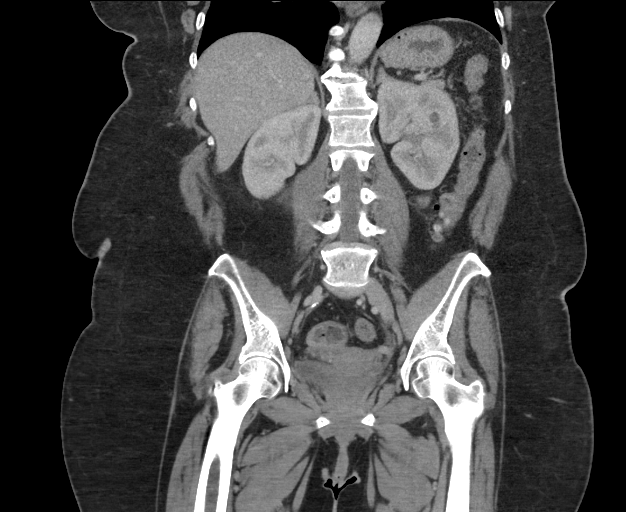

[16 of 46 positions shown; findings below may reference images not displayed]

FINDINGS: Lower chest: Clear lung bases.  Heart borderline enlarged.

Hepatobiliary: No focal liver abnormality is seen. Status post
cholecystectomy. No biliary dilatation.

Pancreas: Unremarkable. No pancreatic ductal dilatation or
surrounding inflammatory changes.

Spleen: Normal in size without focal abnormality.

Adrenals/Urinary Tract: No adrenal masses.

Kidneys are normal in size, orientation and position with symmetric
enhancement and excretion. Subcentimeter low-density renal masses,
largest measuring 8-9 mm, anterior midpole of the left kidney, all
consistent with cysts and stable from the prior CT. No stones. No
hydronephrosis. Normal ureters. Bladder is unremarkable.

Stomach/Bowel: Wall thickening and adjacent inflammation is noted
along the proximal to mid sigmoid colon, with there are multiple
diverticula, consistent mild uncomplicated diverticulitis. No
evidence of an abscess. No extraluminal or free air. There are
additional diverticula elsewhere along the colon. No other
inflammation. Small bowel and stomach are unremarkable. Appendix is
surgically absent.

Vascular/Lymphatic: No significant vascular findings are present. No
enlarged abdominal or pelvic lymph nodes.

Reproductive: Uterus normal in size. Prominent periuterine vessels.
No masses. Ovaries and adnexa are unremarkable.

Other: No abdominal wall hernia.  No ascites.

Musculoskeletal: No fracture or acute finding. No osteoblastic or
osteolytic lesions.
IMPRESSION: 1. Mild uncomplicated sigmoid diverticulitis. No abscess or
extraluminal air.
2. No other acute findings within the abdomen or pelvis.

## 2020-05-28 IMAGING — DX DG CHEST 1V PORT
1 series · 1 of 1 positions shown · non-contrast
Comparison: 8514.

CLINICAL DATA: Abdominal pain

EXAM:
PORTABLE CHEST 1 VIEW

[chest]
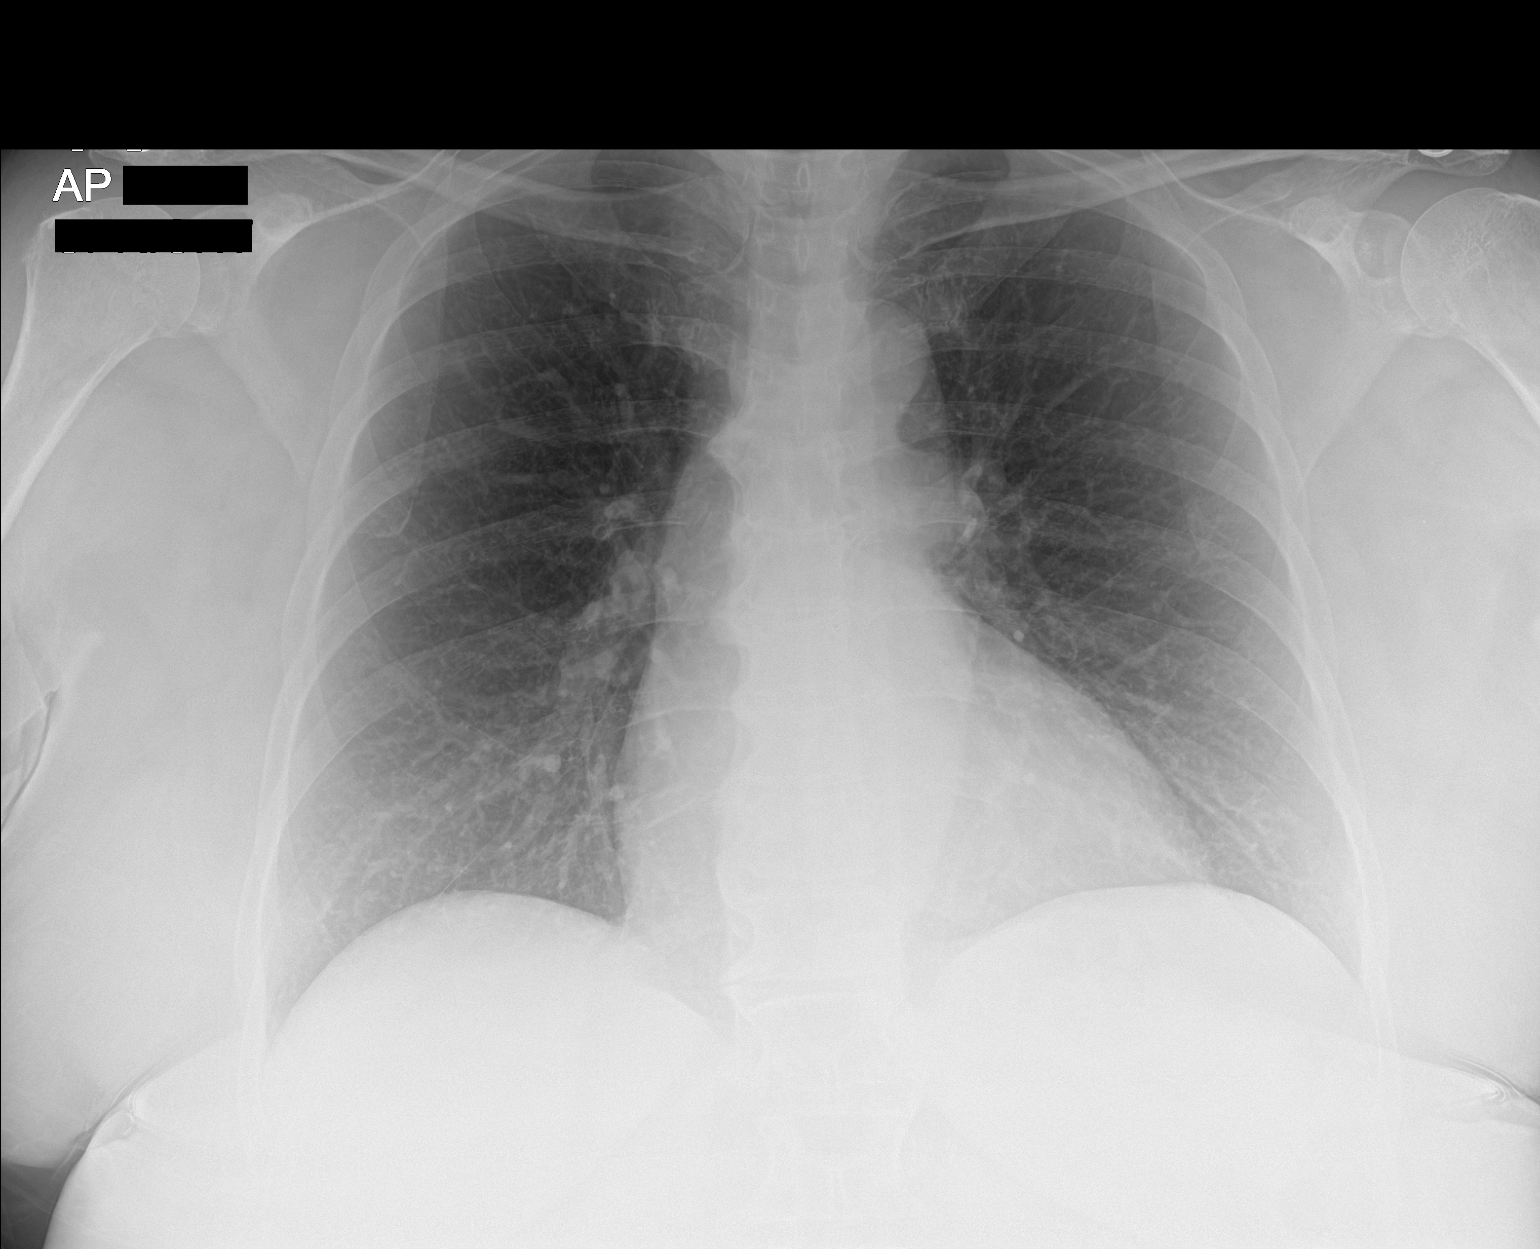

[1 of 1 positions shown; findings below may reference images not displayed]

FINDINGS: The heart size and mediastinal contours are within normal limits.
Both lungs are clear. No pleural effusion or pneumothorax. The
visualized skeletal structures are unremarkable.
IMPRESSION: No acute process in the chest.

## 2020-05-28 NOTE — Telephone Encounter (Signed)
Called patient to clarify her return to work date by Dr. Dema Severin who did her colon surgery for her disability. She is unsure and will call the office tomorrow for clarification and will get back to [place].

## 2020-05-29 ENCOUNTER — Encounter: Payer: Self-pay | Admitting: Nurse Practitioner

## 2020-06-05 ENCOUNTER — Other Ambulatory Visit (INDEPENDENT_AMBULATORY_CARE_PROVIDER_SITE_OTHER): Payer: BC Managed Care – PPO

## 2020-06-05 DIAGNOSIS — Z01818 Encounter for other preprocedural examination: Secondary | ICD-10-CM

## 2020-06-05 DIAGNOSIS — R319 Hematuria, unspecified: Secondary | ICD-10-CM

## 2020-06-05 LAB — POCT URINALYSIS DIPSTICK
Bilirubin, UA: NEGATIVE
Glucose, UA: NEGATIVE
Ketones, UA: NEGATIVE
Leukocytes, UA: NEGATIVE
Nitrite, UA: NEGATIVE
Protein, UA: NEGATIVE
Spec Grav, UA: 1.025 (ref 1.010–1.025)
Urobilinogen, UA: 0.2 E.U./dL
pH, UA: 6 (ref 5.0–8.0)

## 2020-06-06 ENCOUNTER — Encounter: Payer: Self-pay | Admitting: Nurse Practitioner

## 2020-06-07 LAB — URINE CULTURE

## 2020-06-24 ENCOUNTER — Ambulatory Visit: Payer: Self-pay | Admitting: Student

## 2020-07-07 ENCOUNTER — Encounter: Payer: Self-pay | Admitting: Nurse Practitioner

## 2020-07-09 ENCOUNTER — Telehealth: Payer: Self-pay

## 2020-07-09 NOTE — Telephone Encounter (Signed)
I called and left pt a vm letting her know her forms have been completed and faxed. A copy has been placed up front for her. YL,RMA

## 2020-07-28 ENCOUNTER — Ambulatory Visit: Payer: Self-pay | Admitting: Student

## 2020-07-28 NOTE — H&P (Signed)
TOTAL KNEE ADMISSION H&P  Patient is being admitted for right total knee arthroplasty.  Subjective:  Chief Complaint:right knee pain.  HPI: Katie Woodard, 55 y.o. female, has a history of pain and functional disability in the right knee due to arthritis and has failed non-surgical conservative treatments for greater than 12 weeks to includeNSAID's and/or analgesics and corticosteriod injections.  Onset of symptoms was gradual, starting 2 years ago with gradually worsening course since that time. The patient noted no past surgery on the right knee(s).  Patient currently rates pain in the right knee(s) at 8 out of 10 with activity. Patient has worsening of pain with activity and weight bearing, pain that interferes with activities of daily living and pain with passive range of motion.  Patient has evidence of subchondral cysts and joint space narrowing by imaging studies. There is no active infection.  Patient Active Problem List   Diagnosis Date Noted  . S/P laparoscopic-assisted sigmoidectomy 02/13/2020  . Diverticulitis of sigmoid colon 01/23/2019  . GERD (gastroesophageal reflux disease)   . Hypokalemia   . Abdominal pain 01/31/2014  . Abdominal pain, chronic, right lower quadrant 01/31/2014  . OBESITY 11/11/2009  . ABSCESS, TOOTH 11/11/2009  . DIVERTICULITIS OF COLON 02/26/2009  . RECTAL BLEEDING 02/12/2009  . ABDOMINAL PAIN, CHRONIC 02/12/2009  . Microcytic anemia 11/13/2008  . MIGRAINE HEADACHE 11/13/2008  . HEMOCCULT POSITIVE STOOL 11/13/2008  . MICROSCOPIC HEMATURIA 11/13/2008  . VAGINITIS, BACTERIAL 11/13/2008  . GERD 10/18/2008   Past Medical History:  Diagnosis Date  . Chronic kidney disease    mases in kidney  . DDD (degenerative disc disease), cervical   . Diverticulitis   . GERD (gastroesophageal reflux disease)    Takes OTC meds  . Headache   . Ovarian cyst   . Uterine fibroid     Past Surgical History:  Procedure Laterality Date  . APPENDECTOMY    .  CESAREAN SECTION  1988  . CHOLECYSTECTOMY  2005   [facility]  . FLEXIBLE SIGMOIDOSCOPY N/A 02/13/2020   Procedure: FLEXIBLE SIGMOIDOSCOPY;  Surgeon: Andria Meuse, MD;  Location: [facility];  Service: General;  Laterality: N/A;  . LAPAROSCOPIC APPENDECTOMY N/A 01/31/2014   Procedure: APPENDECTOMY LAPAROSCOPIC;  Surgeon: Cherylynn Ridges, MD;  Location: [facility] OR;  Service: General;  Laterality: N/A;    Current Outpatient Medications  Medication Sig Dispense Refill Last Dose  . acetaminophen (TYLENOL) 500 MG tablet Take 500-1,000 mg by mouth every 6 (six) hours as needed (for pain.).     Marland Kitchen amLODipine (NORVASC) 5 MG tablet Take 1 tablet (5 mg total) by mouth daily. 30 tablet 1   . famotidine (PEPCID) 20 MG tablet Take 20 mg by mouth 3 (three) times daily as needed for heartburn or indigestion.     . traMADol (ULTRAM) 50 MG tablet Take 1-2 tablets (50-100 mg total) by mouth every 6 (six) hours as needed (severe pain not controlled with tylenol and ibuprofen first). 30 tablet 0    No current facility-administered medications for this visit.   Allergies  Allergen Reactions  . Dilaudid [Hydromorphone Hcl] Nausea And Vomiting and Other (See Comments)    " head spinning sensation"     Social History   Tobacco Use  . Smoking status: Never Smoker  . Smokeless tobacco: Never Used  Substance Use Topics  . Alcohol use: No    Family History  Problem Relation Age of Onset  . Cancer Mother        breast  . Breast  cancer Mother        60s and again in her 72s  . Cancer Sister        breast x2  . Breast cancer Sister        early 12s and again in late 66s  . Cancer Maternal Aunt        ovarian     Review of Systems  Musculoskeletal: Positive for arthralgias.  All other systems reviewed and are negative.   Objective:  Physical Exam HENT:     Head: Normocephalic.  Eyes:     Extraocular Movements: Extraocular movements intact.     Pupils: Pupils are equal, round, and reactive to light.   Cardiovascular:     Rate and Rhythm: Normal rate and regular rhythm.     Pulses: Normal pulses.  Pulmonary:     Breath sounds: Normal breath sounds.  Abdominal:     Palpations: Abdomen is soft.     Tenderness: There is no abdominal tenderness.  Genitourinary:    Comments: Deferred Musculoskeletal:     Comments: Examination of the right knee reveals no skin wounds or lesions. She has swelling, trace effusion. No warmth or erythema. Varus deformity. Tenderness to palpation medial joint line, lateral joint line, peripatellar retinacular tissues with positive grind sign. Range of motion 0 to 80 degrees without any instability. Knee is very stiff. Painless range of motion of the hip  Skin:    General: Skin is warm and dry.  Neurological:     Mental Status: She is alert and oriented to person, place, and time.  Psychiatric:        Mood and Affect: Mood normal.     Vital signs in last 24 hours: @VSRANGES @  Labs:   Estimated body mass index is 39.32 kg/m as calculated from the following:   Height as of 04/29/20: 4' 11.2" (1.504 m).   Weight as of 04/29/20: 88.9 kg.   Imaging Review Plain radiographs demonstrate severe degenerative joint disease of the right knee(s). The overall alignment ismild varus. The bone quality appears to be adequate for age and reported activity level.      Assessment/Plan:  End stage arthritis, right knee   The patient history, physical examination, clinical judgment of the provider and imaging studies are consistent with end stage degenerative joint disease of the right knee(s) and total knee arthroplasty is deemed medically necessary. The treatment options including medical management, injection therapy arthroscopy and arthroplasty were discussed at length. The risks and benefits of total knee arthroplasty were presented and reviewed. The risks due to aseptic loosening, infection, stiffness, patella tracking problems, thromboembolic complications and  other imponderables were discussed. The patient acknowledged the explanation, agreed to proceed with the plan and consent was signed. Patient is being admitted for inpatient treatment for surgery, pain control, PT, OT, prophylactic antibiotics, VTE prophylaxis, progressive ambulation and ADL's and discharge planning. The patient is planning to be discharged same day     Patient's anticipated LOS is less than 2 midnights, meeting these requirements: - Younger than 68 - Lives within 1 hour of care - Has a competent adult at home to recover with post-op recover - NO history of  - Chronic pain requiring opiods  - Diabetes  - Coronary Artery Disease  - Heart failure  - Heart attack  - Stroke  - DVT/VTE  - Cardiac arrhythmia  - Respiratory Failure/COPD  - Renal failure  - Anemia  - Advanced Liver disease

## 2020-07-30 NOTE — Patient Instructions (Addendum)
DUE TO COVID-19 ONLY ONE VISITOR IS ALLOWED TO COME WITH YOU AND STAY IN THE WAITING ROOM ONLY DURING PRE OP AND PROCEDURE DAY OF SURGERY. THE 1 VISITOR  MAY VISIT WITH YOU AFTER SURGERY IN YOUR PRIVATE ROOM DURING VISITING HOURS ONLY!  YOU NEED TO HAVE A COVID 19 TEST ON Saturday 08/09/2020  @  0915, THIS TEST MUST BE DONE BEFORE SURGERY,  COVID TESTING SITE [address] [place] [address], IT IS ON THE RIGHT GOING OUT [address] APPROXIMATELY  2 MINUTES PAST [employer] ON THE RIGHT. ONCE YOUR COVID TEST IS COMPLETED,  PLEASE BEGIN THE QUARANTINE INSTRUCTIONS AS OUTLINED IN YOUR HANDOUT.                TARIAH LAMBERTY    Your procedure is scheduled on: 08/13/20   Report to [facility] Main  Entrance   Report to admitting at 9:00 AM     Call this number if you have problems the morning of surgery [place], NO CHEWING GUM [place].   No food after midnight.    You may have clear liquid until 8:30 AM     CLEAR LIQUID DIET   Foods Allowed                                                                     Foods Excluded  Coffee and tea, regular and decaf                             liquids that you cannot  Plain Jell-O any favor except red or purple                                           see through such as: Fruit ices (not with fruit pulp)                                     milk, soups, orange juice  Iced Popsicles                                    All solid food Carbonated beverages, regular and diet                                    Cranberry, grape and apple juices Sports drinks like Gatorade Lightly seasoned clear broth or consume(fat free) Sugar, honey syrup     .  At 8:30 AM drink pre surgery drink  . Nothing by mouth after 8:30 AM.   Take these medicines the morning of surgery with A SIP OF WATER: none  You may not  have any metal on your body including hair pins and              piercings  Do not wear jewelry, make-up, lotions, powders or perfumes, deodorant             Do not wear nail polish on your fingernails.  Do not shave  48 hours prior to surgery.             Do not bring valuables to the hospital. [place] IS NOT             RESPONSIBLE   FOR VALUABLES.  Contacts, dentures or bridgework may not be worn into surgery.       Patients discharged the day of surgery will not be allowed to drive home  . IF YOU ARE HAVING SURGERY AND GOING HOME THE SAME DAY, YOU MUST HAVE AN ADULT TO DRIVE YOU HOME AND BE WITH YOU FOR 24 HOURS  . YOU MAY GO HOME BY TAXI OR UBER OR ORTHERWISE, BUT AN ADULT MUST ACCOMPANY YOU HOME AND STAY WITH YOU FOR 24 HOURS.  _____________________________________________________________________             [facility] - Preparing for Surgery Before surgery, you can play an important role .  Because skin is not sterile, your skin needs to be as free of germs as possible.   You can reduce the number of germs on your skin by washing with CHG (chlorahexidine gluconate) soap before surgery.   CHG is an antiseptic cleaner which kills germs and bonds with the skin to continue killing germs even after washing. Please DO NOT use if you have an allergy to CHG or antibacterial soaps.   If your skin becomes reddened/irritated stop using the CHG and inform your nurse when you arrive at Short Stay. Do not shave (including legs and underarms) for at least 48 hours prior to the first CHG shower.   . Please follow these instructions carefully:  1.  Shower with CHG Soap the night before surgery and the  morning of Surgery.  2.  If you choose to wash your hair, wash your hair first as usual with your  normal  shampoo.  3.  After you shampoo, rinse your hair and body thoroughly to remove the  shampoo.                                        4.  Use CHG as you would any other liquid soap.   You can apply chg directly  to the skin and wash                       Gently with a scrungie or clean washcloth.  5.  Apply the CHG Soap to your body ONLY FROM THE NECK DOWN.   Do not use on face/ open                           Wound or open sores. Avoid contact with eyes, ears mouth and genitals (private parts).                       Wash face,  Genitals (private parts) with your normal soap.             6.  Wash thoroughly, paying special attention to the area where your surgery  will be performed.  7.  Thoroughly rinse your body with warm water from the neck down.  8.  DO NOT shower/wash with your normal soap after using and rinsing off  the CHG Soap.             9.  Pat yourself dry with a clean towel.            10.  Wear clean pajamas.            11.  Place clean sheets on your bed the night of your first shower and do not  sleep with pets. Day of Surgery : Do not apply any lotions/deodorants the morning of surgery.  Please wear clean clothes to the hospital/surgery center.  FAILURE TO FOLLOW THESE INSTRUCTIONS MAY RESULT IN THE CANCELLATION OF YOUR SURGERY PATIENT SIGNATURE_________________________________  NURSE SIGNATURE__________________________________  ________________________________________________________________________   [name]  An incentive spirometer is a tool that can help keep your lungs clear and active. This tool measures how well you are filling your lungs with each breath. Taking long deep breaths may help reverse or decrease the chance of developing breathing (pulmonary) problems (especially infection) following:  A long period of time when you are unable to move or be active. BEFORE THE PROCEDURE   If the spirometer includes an indicator to show your best effort, your nurse or respiratory therapist will set it to a desired goal.  If possible, sit up straight or lean slightly forward. Try not to slouch.  Hold the incentive spirometer in an  upright position. INSTRUCTIONS FOR USE  1. Sit on the edge of your bed if possible, or sit up as far as you can in bed or on a chair. 2. Hold the incentive spirometer in an upright position. 3. Breathe out normally. 4. Place the mouthpiece in your mouth and seal your lips tightly around it. 5. Breathe in slowly and as deeply as possible, raising the piston or the ball toward the top of the column. 6. Hold your breath for 3-5 seconds or for as long as possible. Allow the piston or ball to fall to the bottom of the column. 7. Remove the mouthpiece from your mouth and breathe out normally. 8. Rest for a few seconds and repeat Steps 1 through 7 at least 10 times every 1-2 hours when you are awake. Take your time and take a few normal breaths between deep breaths. 9. The spirometer may include an indicator to show your best effort. Use the indicator as a goal to work toward during each repetition. 10. After each set of 10 deep breaths, practice coughing to be sure your lungs are clear. If you have an incision (the cut made at the time of surgery), support your incision when coughing by placing a pillow or rolled up towels firmly against it. Once you are able to get out of bed, walk around indoors and cough well. You may stop using the incentive spirometer when instructed by your caregiver.  RISKS AND COMPLICATIONS  Take your time so you do not get dizzy or light-headed.  If you are in pain, you may need to take or ask for pain medication before doing incentive spirometry. It is harder to take a deep breath if you are having pain. AFTER USE  Rest and breathe slowly and easily.  It can be helpful to keep track of a log of your progress. Your caregiver can provide  you with a simple table to help with this. If you are using the spirometer at home, follow these instructions: [place] IF:   You are having difficultly using the spirometer.  You have trouble using the spirometer as often as  instructed.  Your pain medication is not giving enough relief while using the spirometer.  You develop fever of 100.5 F (38.1 C) or higher. SEEK IMMEDIATE MEDICAL CARE IF:   You cough up bloody sputum that had not been present before.  You develop fever of 102 F (38.9 C) or greater.  You develop worsening pain at or near the incision site. MAKE SURE YOU:   Understand these instructions.  Will watch your condition.  Will get help right away if you are not doing well or get worse. Document Released: 11/22/2006 Document Revised: 10/04/2011 Document Reviewed: 01/23/2007 [employer] Patient Information 2014 [employer], [place].   ________________________________________________________________________  WHAT IS A BLOOD TRANSFUSION? Blood Transfusion Information  A transfusion is the replacement of blood or some of its parts. Blood is made up of multiple cells which provide different functions.  Red blood cells carry oxygen and are used for blood loss replacement.  White blood cells fight against infection.  Platelets control bleeding.  Plasma helps clot blood.  Other blood products are available for specialized needs, such as hemophilia or other clotting disorders. BEFORE THE TRANSFUSION  Who gives blood for transfusions?   Healthy volunteers who are fully evaluated to make sure their blood is safe. This is blood bank blood. Transfusion therapy is the safest it has ever been in the practice of medicine. Before blood is taken from a donor, a complete history is taken to make sure that person has no history of diseases nor engages in risky social behavior (examples are intravenous drug use or sexual activity with multiple partners). The donor's travel history is screened to minimize risk of transmitting infections, such as malaria. The donated blood is tested for signs of infectious diseases, such as HIV and hepatitis. The blood is then tested to be sure it is compatible with you in  order to minimize the chance of a transfusion reaction. If you or a relative donates blood, this is often done in anticipation of surgery and is not appropriate for emergency situations. It takes many days to process the donated blood. RISKS AND COMPLICATIONS Although transfusion therapy is very safe and saves many lives, the main dangers of transfusion include:   Getting an infectious disease.  Developing a transfusion reaction. This is an allergic reaction to something in the blood you were given. Every precaution is taken to prevent this. The decision to have a blood transfusion has been considered carefully by your caregiver before blood is given. Blood is not given unless the benefits outweigh the risks. AFTER THE TRANSFUSION  Right after receiving a blood transfusion, you will usually feel much better and more energetic. This is especially true if your red blood cells have gotten low (anemic). The transfusion raises the level of the red blood cells which carry oxygen, and this usually causes an energy increase.  The nurse administering the transfusion will monitor you carefully for complications. HOME CARE INSTRUCTIONS  No special instructions are needed after a transfusion. You may find your energy is better. Speak with your caregiver about any limitations on activity for underlying diseases you may have. SEEK MEDICAL CARE IF:   Your condition is not improving after your transfusion.  You develop redness or irritation at the intravenous (IV)  site. SEEK IMMEDIATE MEDICAL CARE IF:  Any of the following symptoms occur over the next 12 hours:  Shaking chills.  You have a temperature by mouth above 102 F (38.9 C), not controlled by medicine.  Chest, back, or muscle pain.  People around you feel you are not acting correctly or are confused.  Shortness of breath or difficulty breathing.  Dizziness and fainting.  You get a rash or develop hives.  You have a decrease in urine  output.  Your urine turns a dark color or changes to pink, red, or brown. Any of the following symptoms occur over the next 10 days:  You have a temperature by mouth above 102 F (38.9 C), not controlled by medicine.  Shortness of breath.  Weakness after normal activity.  The white part of the eye turns yellow (jaundice).  You have a decrease in the amount of urine or are urinating less often.  Your urine turns a dark color or changes to pink, red, or brown. Document Released: 07/09/2000 Document Revised: 10/04/2011 Document Reviewed: 02/26/2008 [facility] Patient Information 2014 [place], [place].  _______________________________________________________________________

## 2020-08-04 ENCOUNTER — Other Ambulatory Visit: Payer: Self-pay

## 2020-08-04 ENCOUNTER — Encounter (HOSPITAL_COMMUNITY): Payer: Self-pay

## 2020-08-04 ENCOUNTER — Encounter (HOSPITAL_COMMUNITY)
Admission: RE | Admit: 2020-08-04 | Discharge: 2020-08-04 | Disposition: A | Discharge status: Home / Self Care | Payer: BC Managed Care – PPO | Source: Ambulatory Visit | Attending: Orthopedic Surgery | Admitting: Orthopedic Surgery

## 2020-08-04 DIAGNOSIS — Z01812 Encounter for preprocedural laboratory examination: Secondary | ICD-10-CM | POA: Insufficient documentation

## 2020-08-04 HISTORY — DX: Personal history of other medical treatment: Z92.89

## 2020-08-04 LAB — COMPREHENSIVE METABOLIC PANEL
ALT: 17 U/L (ref 0–44)
AST: 13 U/L — ABNORMAL LOW (ref 15–41)
Albumin: 4.5 g/dL (ref 3.5–5.0)
Alkaline Phosphatase: 65 U/L (ref 38–126)
Anion gap: 9 (ref 5–15)
BUN: 13 mg/dL (ref 6–20)
CO2: 27 mmol/L (ref 22–32)
Calcium: 9.5 mg/dL (ref 8.9–10.3)
Chloride: 103 mmol/L (ref 98–111)
Creatinine, Ser: 0.48 mg/dL (ref 0.44–1.00)
GFR, Estimated: >60 mL/min (ref >60)
Glucose, Bld: 127 mg/dL — ABNORMAL HIGH (ref 70–99)
Potassium: 3.9 mmol/L (ref 3.5–5.1)
Sodium: 139 mmol/L (ref 135–145)
Total Bilirubin: 0.5 mg/dL (ref 0.3–1.2)
Total Protein: 8.1 g/dL (ref 6.5–8.1)

## 2020-08-04 LAB — SURGICAL PCR SCREEN
MRSA, PCR: NEGATIVE
Staphylococcus aureus: NEGATIVE

## 2020-08-04 LAB — CBC
HCT: 42.5 % (ref 36.0–46.0)
Hemoglobin: 12.8 g/dL (ref 12.0–15.0)
MCH: 21 pg — ABNORMAL LOW (ref 26.0–34.0)
MCHC: 30.1 g/dL (ref 30.0–36.0)
MCV: 69.7 fL — ABNORMAL LOW (ref 80.0–100.0)
Platelets: 319 10*3/uL (ref 150–400)
RBC: 6.1 MIL/uL — ABNORMAL HIGH (ref 3.87–5.11)
RDW: 20 % — ABNORMAL HIGH (ref 11.5–15.5)
WBC: 6.9 10*3/uL (ref 4.0–10.5)
nRBC: 0 % (ref 0.0–0.2)

## 2020-08-04 LAB — URINALYSIS, ROUTINE W REFLEX MICROSCOPIC
Bilirubin Urine: NEGATIVE
Glucose, UA: NEGATIVE mg/dL
Hgb urine dipstick: NEGATIVE
Ketones, ur: NEGATIVE mg/dL
Leukocytes,Ua: NEGATIVE
Nitrite: NEGATIVE
Protein, ur: NEGATIVE mg/dL
Specific Gravity, Urine: 1.015 (ref 1.005–1.030)
pH: 7 (ref 5.0–8.0)

## 2020-08-04 LAB — PROTIME-INR
INR: 1 (ref 0.8–1.2)
Prothrombin Time: 12.5 seconds (ref 11.4–15.2)

## 2020-08-04 NOTE — Progress Notes (Addendum)
EKG/epic 02/05/2020   Patient verbalized understanding of instructions that were given to them at the PAT appointment. Patient was also instructed that they will need to review over the PAT instructions again at home before surgery.   Chart passed to [place] PA to review hgb drawn on 08/04/2020 of 6.10

## 2020-08-08 ENCOUNTER — Ambulatory Visit: Payer: Self-pay | Admitting: Student

## 2020-08-09 ENCOUNTER — Other Ambulatory Visit (HOSPITAL_COMMUNITY): Payer: BC Managed Care – PPO

## 2020-08-13 ENCOUNTER — Other Ambulatory Visit (HOSPITAL_COMMUNITY): Payer: BC Managed Care – PPO

## 2020-08-13 LAB — TYPE AND SCREEN
ABO/RH(D): B POS
Antibody Screen: NEGATIVE

## 2020-08-22 ENCOUNTER — Encounter (HOSPITAL_COMMUNITY)
Admission: RE | Admit: 2020-08-22 | Discharge: 2020-08-22 | Disposition: A | Discharge status: Home / Self Care | Payer: BC Managed Care – PPO | Source: Ambulatory Visit | Attending: Orthopedic Surgery | Admitting: Orthopedic Surgery

## 2020-08-22 ENCOUNTER — Other Ambulatory Visit: Payer: Self-pay

## 2020-08-22 ENCOUNTER — Encounter (HOSPITAL_COMMUNITY): Payer: Self-pay

## 2020-08-22 DIAGNOSIS — Z01812 Encounter for preprocedural laboratory examination: Secondary | ICD-10-CM | POA: Diagnosis present

## 2020-08-22 LAB — CBC
HCT: 40.7 % (ref 36.0–46.0)
Hemoglobin: 12.3 g/dL (ref 12.0–15.0)
MCH: 21.1 pg — ABNORMAL LOW (ref 26.0–34.0)
MCHC: 30.2 g/dL (ref 30.0–36.0)
MCV: 69.9 fL — ABNORMAL LOW (ref 80.0–100.0)
Platelets: 298 10*3/uL (ref 150–400)
RBC: 5.82 MIL/uL — ABNORMAL HIGH (ref 3.87–5.11)
RDW: 19.2 % — ABNORMAL HIGH (ref 11.5–15.5)
WBC: 7.1 10*3/uL (ref 4.0–10.5)
nRBC: 0 % (ref 0.0–0.2)

## 2020-08-22 LAB — BASIC METABOLIC PANEL
Anion gap: 9 (ref 5–15)
BUN: 14 mg/dL (ref 6–20)
CO2: 26 mmol/L (ref 22–32)
Calcium: 9.4 mg/dL (ref 8.9–10.3)
Chloride: 105 mmol/L (ref 98–111)
Creatinine, Ser: 0.53 mg/dL (ref 0.44–1.00)
GFR, Estimated: >60 mL/min (ref >60)
Glucose, Bld: 77 mg/dL (ref 70–99)
Potassium: 4.6 mmol/L (ref 3.5–5.1)
Sodium: 140 mmol/L (ref 135–145)

## 2020-08-22 LAB — SURGICAL PCR SCREEN
MRSA, PCR: NEGATIVE
Staphylococcus aureus: NEGATIVE

## 2020-08-22 NOTE — Progress Notes (Signed)
COVID Vaccine Completed:no Date COVID Vaccine completed: COVID vaccine manufacturer: [employer]   PCP - Minette Brine FNP Cardiologist - no  Chest x-ray - no EKG - 02/05/20-epic Stress Test - no ECHO - no Cardiac Cath - no Pacemaker/ICD device last checked:NA  Sleep Study - no CPAP -   Fasting Blood Sugar - NA Checks Blood Sugar _____ times a day  Blood Thinner Instructions:NA Aspirin Instructions: Last Dose:  Anesthesia review:   Patient denies shortness of breath, fever, cough and chest pain at PAT appointment yes  Patient verbalized understanding of instructions that were given to them at the PAT appointment. Patient was also instructed that they will need to review over the PAT instructions again at home before surgery.Yes Pt said that her "other Knee' hurts now so she doesn't climb stairs. Her BMI is 41.2. She has no SOB doing housework or with ADLs

## 2020-08-22 NOTE — Patient Instructions (Signed)
DUE TO COVID-19 ONLY ONE VISITOR IS ALLOWED TO COME WITH YOU AND STAY IN THE WAITING ROOM ONLY DURING PRE OP AND PROCEDURE DAY OF SURGERY. THE 1 VISITOR  MAY VISIT WITH YOU AFTER SURGERY IN YOUR PRIVATE ROOM DURING VISITING HOURS ONLY!  YOU NEED TO HAVE A COVID 19 TEST ON__1/31_____ @_8 :50______, THIS TEST MUST BE DONE BEFORE SURGERY,  COVID TESTING SITE [place] [address], IT IS ON THE RIGHT GOING OUT [address] APPROXIMATELY  2 MINUTES PAST [employer] ON THE RIGHT. ONCE YOUR COVID TEST IS COMPLETED,  PLEASE BEGIN THE QUARANTINE INSTRUCTIONS AS OUTLINED IN YOUR HANDOUT.                Katie Woodard    Your procedure is scheduled on: 08/28/20   Report to [facility] Main  Entrance   Report to Short stay 5:30 AM     Call this number if you have problems the morning of surgery [phone]    [place], NO CHEWING GUM [place].   No food after midnight.    You may have clear liquid until 4:30 AM.    At 4:300 AM drink pre surgery drink  . Nothing by mouth after 4:30 AM.    Take these medicines the morning of surgery with A SIP OF WATER:none                                   You may not have any metal on your body including hair pins and              piercings  Do not wear jewelry, make-up, lotions, powders or perfumes, deodorant             Do not wear nail polish on your fingernails.  Do not shave  48 hours prior to surgery.               Do not bring valuables to the hospital. Katie Woodard.  Contacts, dentures or bridgework may not be worn into surgery.      Patients discharged the day of surgery will not be allowed to drive home  . IF YOU ARE HAVING SURGERY AND GOING HOME THE SAME DAY, YOU MUST HAVE AN ADULT TO DRIVE YOU HOME AND BE WITH YOU FOR 24 HOURS.  YOU MAY GO HOME BY TAXI OR UBER OR ORTHERWISE, BUT AN ADULT MUST  ACCOMPANY YOU HOME AND STAY WITH YOU FOR 24 HOURS.  Name and phone number of your driver:  Special Instructions: N/A              Please read over the following fact sheets you were given: _____________________________________________________________________             [facility] - Preparing for Surgery Before surgery, you can play an important role.   Because skin is not sterile, your skin needs to be as free of germs as possible.   You can reduce the number of germs on your skin by washing with CHG (chlorahexidine gluconate) soap before surgery.   CHG is an antiseptic cleaner which kills germs and bonds with the skin to continue killing germs even after washing. Please DO NOT use if you have  an allergy to CHG or antibacterial soaps .  If your skin becomes reddened/irritated stop using the CHG and inform your nurse when you arrive at Short Stay. Do not shave (including legs and underarms) for at least 48 hours prior to the first CHG shower.    Please follow these instructions carefully:  1.  Shower with CHG Soap the night before surgery and the  morning of Surgery.  2.  If you choose to wash your hair, wash your hair first as usual with your  normal  shampoo.  3.  After you shampoo, rinse your hair and body thoroughly to remove the  shampoo.                                        4.  Use CHG as you would any other liquid soap.  You can apply chg directly  to the skin and wash                       Gently with a scrungie or clean washcloth.  5.  Apply the CHG Soap to your body ONLY FROM THE NECK DOWN.   Do not use on face/ open                           Wound or open sores. Avoid contact with eyes, ears mouth and genitals (private parts).                       Wash face,  Genitals (private parts) with your normal soap.             6.  Wash thoroughly, paying special attention to the area where your surgery  will be performed.  7.  Thoroughly rinse your body with warm water from the  neck down.  8.  DO NOT shower/wash with your normal soap after using and rinsing off  the CHG Soap.                9.  Pat yourself dry with a clean towel.            10.  Wear clean pajamas.            11.  Place clean sheets on your bed the night of your first shower and do not  sleep with pets. Day of Surgery : Do not apply any lotions/deodorants the morning of surgery.  Please wear clean clothes to the hospital/surgery center.  FAILURE TO FOLLOW THESE INSTRUCTIONS MAY RESULT IN THE CANCELLATION OF YOUR SURGERY PATIENT SIGNATURE_________________________________  NURSE SIGNATURE__________________________________  ________________________________________________________________________   Katie Woodard  An incentive spirometer is a tool that can help keep your lungs clear and active. This tool measures how well you are filling your lungs with each breath. Taking long deep breaths may help reverse or decrease the chance of developing breathing (pulmonary) problems (especially infection) following:  A long period of time when you are unable to move or be active. BEFORE THE PROCEDURE   If the spirometer includes an indicator to show your best effort, your nurse or respiratory therapist will set it to a desired goal.  If possible, sit up straight or lean slightly forward. Try not to slouch.  Hold the incentive spirometer in an upright position. INSTRUCTIONS FOR USE  1. Sit on the  edge of your bed if possible, or sit up as far as you can in bed or on a chair. 2. Hold the incentive spirometer in an upright position. 3. Breathe out normally. 4. Place the mouthpiece in your mouth and seal your lips tightly around it. 5. Breathe in slowly and as deeply as possible, raising the piston or the ball toward the top of the column. 6. Hold your breath for 3-5 seconds or for as long as possible. Allow the piston or ball to fall to the bottom of the column. 7. Remove the mouthpiece from your  mouth and breathe out normally. 8. Rest for a few seconds and repeat Steps 1 through 7 at least 10 times every 1-2 hours when you are awake. Take your time and take a few normal breaths between deep breaths. 9. The spirometer may include an indicator to show your best effort. Use the indicator as a goal to work toward during each repetition. 10. After each set of 10 deep breaths, practice coughing to be sure your lungs are clear. If you have an incision (the cut made at the time of surgery), support your incision when coughing by placing a pillow or rolled up towels firmly against it. Once you are able to get out of bed, walk around indoors and cough well. You may stop using the incentive spirometer when instructed by your caregiver.  RISKS AND COMPLICATIONS  Take your time so you do not get dizzy or light-headed.  If you are in pain, you may need to take or ask for pain medication before doing incentive spirometry. It is harder to take a deep breath if you are having pain. AFTER USE  Rest and breathe slowly and easily.  It can be helpful to keep track of a log of your progress. Your caregiver can provide you with a simple table to help with this. If you are using the spirometer at home, follow these instructions: [place] IF:   You are having difficultly using the spirometer.  You have trouble using the spirometer as often as instructed.  Your pain medication is not giving enough relief while using the spirometer.  You develop fever of 100.5 F (38.1 C) or higher. SEEK IMMEDIATE MEDICAL CARE IF:   You cough up bloody sputum that had not been present before.  You develop fever of 102 F (38.9 C) or greater.  You develop worsening pain at or near the incision site. MAKE SURE YOU:   Understand these instructions.  Will watch your condition.  Will get help right away if you are not doing well or get worse. Document Released: 11/22/2006 Document Revised: 10/04/2011  Document Reviewed: 01/23/2007 [facility] Patient Information 2014 [place], [place].   ________________________________________________________________________

## 2020-08-25 ENCOUNTER — Other Ambulatory Visit (HOSPITAL_COMMUNITY)
Admission: RE | Admit: 2020-08-25 | Discharge: 2020-08-25 | Disposition: A | Discharge status: Home / Self Care | Payer: BC Managed Care – PPO | Source: Ambulatory Visit | Attending: Orthopedic Surgery | Admitting: Orthopedic Surgery

## 2020-08-25 DIAGNOSIS — Z01812 Encounter for preprocedural laboratory examination: Secondary | ICD-10-CM | POA: Insufficient documentation

## 2020-08-25 DIAGNOSIS — U071 COVID-19: Secondary | ICD-10-CM | POA: Diagnosis not present

## 2020-08-25 LAB — SARS CORONAVIRUS 2 (TAT 6-24 HRS): SARS Coronavirus 2: POSITIVE — AB

## 2020-08-26 ENCOUNTER — Telehealth (HOSPITAL_COMMUNITY): Payer: Self-pay | Admitting: Adult Health

## 2020-08-26 NOTE — Progress Notes (Signed)
Katie Woodard with + covid results for this pt. The pt is to have surgery on 08/28/20 at [facility] at [place]. These are the current guidelines:  Positive Results for:  Asymptomatic: Procedure postponed and quarantined for 10 days. Symptomatic: Procedure postponed and quarantined for 14 days. Hospitalized with Covid: postponed and quarantined  for 21 days. Immunocompromised: Procedure postponed and quarantine for 20 days.  The pt will not be retested for 90 days from the + result.

## 2020-08-26 NOTE — Telephone Encounter (Signed)
Called to discuss with patient about COVID-19 symptoms and the use of one of the available treatments for those with mild to moderate Covid symptoms and at a high risk of hospitalization.  Pt appears to qualify for outpatient treatment due to co-morbid conditions and/or a member of an at-risk group in accordance with the FDA Emergency Use Authorization.    Patient underwent testing as part of presurgery work up.  She is asymptomatic and does not meet criteria for treatment.  Scot Dock

## 2020-09-02 NOTE — Progress Notes (Signed)
Katie Woodard made aware to arrive at 9:00 AM 09/10/20, reviewed instructions, she verbalized understanding.

## 2020-09-10 ENCOUNTER — Ambulatory Visit (HOSPITAL_COMMUNITY): Payer: Self-pay | Admitting: Certified Registered Nurse Anesthetist

## 2020-09-10 ENCOUNTER — Ambulatory Visit (HOSPITAL_COMMUNITY): Payer: Self-pay | Admitting: Physician Assistant

## 2020-09-10 ENCOUNTER — Ambulatory Visit (HOSPITAL_COMMUNITY): Payer: Self-pay

## 2020-09-10 ENCOUNTER — Encounter (HOSPITAL_COMMUNITY): Payer: Self-pay | Admitting: Orthopedic Surgery

## 2020-09-10 ENCOUNTER — Encounter (HOSPITAL_COMMUNITY)
Admission: RE | Disposition: A | Discharge status: Home / Self Care | Payer: Self-pay | Source: Home / Self Care | Attending: Orthopedic Surgery

## 2020-09-10 ENCOUNTER — Observation Stay (HOSPITAL_COMMUNITY)
Admission: RE | Admit: 2020-09-10 | Discharge: 2020-09-11 | Disposition: A | Discharge status: Home / Self Care | Payer: Self-pay | Attending: Orthopedic Surgery | Admitting: Orthopedic Surgery

## 2020-09-10 ENCOUNTER — Other Ambulatory Visit: Payer: Self-pay

## 2020-09-10 DIAGNOSIS — M1711 Unilateral primary osteoarthritis, right knee: Principal | ICD-10-CM | POA: Insufficient documentation

## 2020-09-10 DIAGNOSIS — Z96651 Presence of right artificial knee joint: Secondary | ICD-10-CM

## 2020-09-10 DIAGNOSIS — Z79899 Other long term (current) drug therapy: Secondary | ICD-10-CM | POA: Insufficient documentation

## 2020-09-10 DIAGNOSIS — N189 Chronic kidney disease, unspecified: Secondary | ICD-10-CM | POA: Insufficient documentation

## 2020-09-10 HISTORY — PX: KNEE ARTHROPLASTY: SHX992

## 2020-09-10 LAB — BASIC METABOLIC PANEL
Anion gap: 11 (ref 5–15)
BUN: 12 mg/dL (ref 6–20)
CO2: 24 mmol/L (ref 22–32)
Calcium: 9.6 mg/dL (ref 8.9–10.3)
Chloride: 105 mmol/L (ref 98–111)
Creatinine, Ser: 0.52 mg/dL (ref 0.44–1.00)
GFR, Estimated: >60 mL/min (ref >60)
Glucose, Bld: 120 mg/dL — ABNORMAL HIGH (ref 70–99)
Potassium: 3.5 mmol/L (ref 3.5–5.1)
Sodium: 140 mmol/L (ref 135–145)

## 2020-09-10 LAB — CBC
HCT: 42.7 % (ref 36.0–46.0)
Hemoglobin: 13.2 g/dL (ref 12.0–15.0)
MCH: 21.2 pg — ABNORMAL LOW (ref 26.0–34.0)
MCHC: 30.9 g/dL (ref 30.0–36.0)
MCV: 68.6 fL — ABNORMAL LOW (ref 80.0–100.0)
Platelets: 325 10*3/uL (ref 150–400)
RBC: 6.22 MIL/uL — ABNORMAL HIGH (ref 3.87–5.11)
RDW: 19.6 % — ABNORMAL HIGH (ref 11.5–15.5)
WBC: 7.3 10*3/uL (ref 4.0–10.5)
nRBC: 0 % (ref 0.0–0.2)

## 2020-09-10 SURGERY — ARTHROPLASTY, KNEE, TOTAL, USING IMAGELESS COMPUTER-ASSISTED NAVIGATION
Anesthesia: Monitor Anesthesia Care | Site: Knee | Laterality: Right

## 2020-09-10 MED ORDER — ONDANSETRON HCL 4 MG/2ML IJ SOLN: 4.0000 mg | Freq: Four times a day (QID) | INTRAMUSCULAR | Status: DC | PRN

## 2020-09-10 MED ORDER — OXYCODONE HCL 5 MG PO TABS
ORAL_TABLET | ORAL | Status: AC
  Filled 2020-09-10: qty 1

## 2020-09-10 MED ORDER — PHENOL 1.4 % MT LIQD: 1.0000 | OROMUCOSAL | Status: DC | PRN

## 2020-09-10 MED ORDER — ALBUMIN HUMAN 5 % IV SOLN: INTRAVENOUS | Status: DC | PRN

## 2020-09-10 MED ORDER — METOCLOPRAMIDE HCL 5 MG PO TABS
5.0000 mg | ORAL_TABLET | Freq: Three times a day (TID) | ORAL | Status: DC | PRN
  Filled 2020-09-10: qty 2

## 2020-09-10 MED ORDER — SODIUM CHLORIDE (PF) 0.9 % IJ SOLN
INTRAMUSCULAR | Status: DC | PRN
  Administered 2020-09-10: 30 mL

## 2020-09-10 MED ORDER — FENTANYL CITRATE (PF) 100 MCG/2ML IJ SOLN
50.0000 ug | INTRAMUSCULAR | Status: DC
  Administered 2020-09-10: 50 ug via INTRAVENOUS
  Filled 2020-09-10: qty 2

## 2020-09-10 MED ORDER — BUPIVACAINE-EPINEPHRINE (PF) 0.25% -1:200000 IJ SOLN
INTRAMUSCULAR | Status: AC
  Filled 2020-09-10: qty 30

## 2020-09-10 MED ORDER — SODIUM CHLORIDE 0.9 % IV SOLN: INTRAVENOUS | Status: DC

## 2020-09-10 MED ORDER — CHLORHEXIDINE GLUCONATE 0.12 % MT SOLN
15.0000 mL | Freq: Once | OROMUCOSAL | Status: AC
  Administered 2020-09-10: 15 mL via OROMUCOSAL

## 2020-09-10 MED ORDER — PROMETHAZINE HCL 25 MG/ML IJ SOLN
6.2500 mg | INTRAMUSCULAR | Status: DC | PRN
Start: 2020-09-10 — End: 2020-09-10

## 2020-09-10 MED ORDER — SENNA 8.6 MG PO TABS
1.0000 | ORAL_TABLET | Freq: Two times a day (BID) | ORAL | Status: DC
  Administered 2020-09-11: 8.6 mg via ORAL
  Filled 2020-09-10 (×2): qty 1

## 2020-09-10 MED ORDER — FENTANYL CITRATE (PF) 100 MCG/2ML IJ SOLN
INTRAMUSCULAR | Status: AC
  Filled 2020-09-10: qty 2

## 2020-09-10 MED ORDER — IRRISEPT - 450ML BOTTLE WITH 0.05% CHG IN STERILE WATER, USP 99.95% OPTIME
TOPICAL | Status: DC | PRN
  Administered 2020-09-10: 450 mL

## 2020-09-10 MED ORDER — DOCUSATE SODIUM 100 MG PO CAPS: 100.0000 mg | ORAL_CAPSULE | Freq: Two times a day (BID) | ORAL | 1 refills | Status: AC

## 2020-09-10 MED ORDER — KETOROLAC TROMETHAMINE 30 MG/ML IJ SOLN
INTRAMUSCULAR | Status: DC | PRN
  Administered 2020-09-10: 30 mg

## 2020-09-10 MED ORDER — DEXAMETHASONE SODIUM PHOSPHATE 10 MG/ML IJ SOLN
10.0000 mg | Freq: Once | INTRAMUSCULAR | Status: AC
  Administered 2020-09-11: 10 mg via INTRAVENOUS
  Filled 2020-09-10: qty 1

## 2020-09-10 MED ORDER — LIDOCAINE HCL (PF) 2 % IJ SOLN
INTRAMUSCULAR | Status: DC | PRN
  Administered 2020-09-10: 40 mg via INTRADERMAL

## 2020-09-10 MED ORDER — TRANEXAMIC ACID-NACL 1000-0.7 MG/100ML-% IV SOLN
1000.0000 mg | INTRAVENOUS | Status: AC
  Administered 2020-09-10: 1000 mg via INTRAVENOUS
  Filled 2020-09-10: qty 100

## 2020-09-10 MED ORDER — ACETAMINOPHEN 10 MG/ML IV SOLN
1000.0000 mg | Freq: Once | INTRAVENOUS | Status: AC
  Administered 2020-09-10: 1000 mg via INTRAVENOUS
  Filled 2020-09-10: qty 100

## 2020-09-10 MED ORDER — ONDANSETRON HCL 4 MG/2ML IJ SOLN
INTRAMUSCULAR | Status: AC
  Filled 2020-09-10: qty 2

## 2020-09-10 MED ORDER — ONDANSETRON HCL 4 MG PO TABS
4.0000 mg | ORAL_TABLET | Freq: Four times a day (QID) | ORAL | Status: DC | PRN
  Filled 2020-09-10: qty 1

## 2020-09-10 MED ORDER — SENNA 8.6 MG PO TABS: 2.0000 | ORAL_TABLET | Freq: Every day | ORAL | 1 refills | Status: AC

## 2020-09-10 MED ORDER — LIDOCAINE HCL (PF) 2 % IJ SOLN
INTRAMUSCULAR | Status: AC
  Filled 2020-09-10: qty 5

## 2020-09-10 MED ORDER — MELOXICAM 15 MG PO TABS
15.0000 mg | ORAL_TABLET | Freq: Every day | ORAL | 3 refills | Status: DC
Start: 2020-09-10 — End: 2022-01-21

## 2020-09-10 MED ORDER — MIDAZOLAM HCL 5 MG/5ML IJ SOLN
INTRAMUSCULAR | Status: DC | PRN
  Administered 2020-09-10: 2 mg via INTRAVENOUS

## 2020-09-10 MED ORDER — ASPIRIN 81 MG PO CHEW
81.0000 mg | CHEWABLE_TABLET | Freq: Two times a day (BID) | ORAL | Status: DC
  Administered 2020-09-11 (×2): 81 mg via ORAL
  Filled 2020-09-10 (×2): qty 1

## 2020-09-10 MED ORDER — DEXAMETHASONE SODIUM PHOSPHATE 10 MG/ML IJ SOLN
INTRAMUSCULAR | Status: AC
  Filled 2020-09-10: qty 1

## 2020-09-10 MED ORDER — PROPOFOL 10 MG/ML IV BOLUS
INTRAVENOUS | Status: AC
  Filled 2020-09-10: qty 20

## 2020-09-10 MED ORDER — FENTANYL CITRATE (PF) 100 MCG/2ML IJ SOLN
INTRAMUSCULAR | Status: DC | PRN
  Administered 2020-09-10 (×2): 25 ug via INTRAVENOUS
  Administered 2020-09-10: 50 ug via INTRAVENOUS
  Administered 2020-09-10 (×2): 25 ug via INTRAVENOUS
  Administered 2020-09-10: 50 ug via INTRAVENOUS

## 2020-09-10 MED ORDER — DEXAMETHASONE SODIUM PHOSPHATE 10 MG/ML IJ SOLN
INTRAMUSCULAR | Status: DC | PRN
  Administered 2020-09-10: 10 mg via INTRAVENOUS

## 2020-09-10 MED ORDER — LACTATED RINGERS IV SOLN: INTRAVENOUS | Status: DC

## 2020-09-10 MED ORDER — DOCUSATE SODIUM 100 MG PO CAPS
100.0000 mg | ORAL_CAPSULE | Freq: Two times a day (BID) | ORAL | Status: DC
  Administered 2020-09-11: 100 mg via ORAL
  Filled 2020-09-10 (×2): qty 1

## 2020-09-10 MED ORDER — OXYCODONE-ACETAMINOPHEN 5-325 MG PO TABS: 1.0000 | ORAL_TABLET | ORAL | 0 refills | Status: AC | PRN

## 2020-09-10 MED ORDER — OXYCODONE HCL 5 MG PO TABS
5.0000 mg | ORAL_TABLET | ORAL | Status: DC | PRN
  Administered 2020-09-10: 5 mg via ORAL

## 2020-09-10 MED ORDER — OXYCODONE HCL 5 MG PO TABS
10.0000 mg | ORAL_TABLET | ORAL | Status: DC | PRN
  Administered 2020-09-11 (×3): 15 mg via ORAL
  Filled 2020-09-10 (×3): qty 3

## 2020-09-10 MED ORDER — KETOROLAC TROMETHAMINE 30 MG/ML IJ SOLN
INTRAMUSCULAR | Status: AC
  Filled 2020-09-10: qty 1

## 2020-09-10 MED ORDER — OXYCODONE HCL 5 MG/5ML PO SOLN
5.0000 mg | Freq: Once | ORAL | Status: AC | PRN
Start: 2020-09-10 — End: 2020-09-10

## 2020-09-10 MED ORDER — MENTHOL 3 MG MT LOZG: 1.0000 | LOZENGE | OROMUCOSAL | Status: DC | PRN

## 2020-09-10 MED ORDER — METOCLOPRAMIDE HCL 5 MG/ML IJ SOLN: 5.0000 mg | Freq: Three times a day (TID) | INTRAMUSCULAR | Status: DC | PRN

## 2020-09-10 MED ORDER — OXYCODONE HCL 5 MG PO TABS
5.0000 mg | ORAL_TABLET | Freq: Once | ORAL | Status: AC | PRN
  Administered 2020-09-10: 5 mg via ORAL

## 2020-09-10 MED ORDER — KETOROLAC TROMETHAMINE 15 MG/ML IJ SOLN
15.0000 mg | Freq: Four times a day (QID) | INTRAMUSCULAR | Status: DC
  Administered 2020-09-10 – 2020-09-11 (×3): 15 mg via INTRAVENOUS
  Filled 2020-09-10 (×3): qty 1

## 2020-09-10 MED ORDER — ASPIRIN 81 MG PO CHEW: 81.0000 mg | CHEWABLE_TABLET | Freq: Two times a day (BID) | ORAL | 0 refills | Status: AC

## 2020-09-10 MED ORDER — STERILE WATER FOR IRRIGATION IR SOLN
Status: DC | PRN
  Administered 2020-09-10: 2000 mL

## 2020-09-10 MED ORDER — LACTATED RINGERS IV BOLUS
500.0000 mL | Freq: Once | INTRAVENOUS | Status: AC
  Administered 2020-09-10: 500 mL via INTRAVENOUS

## 2020-09-10 MED ORDER — MIDAZOLAM HCL 2 MG/2ML IJ SOLN
1.0000 mg | Freq: Once | INTRAMUSCULAR | Status: AC
  Administered 2020-09-10: 1 mg via INTRAVENOUS
  Filled 2020-09-10: qty 2

## 2020-09-10 MED ORDER — PROPOFOL 500 MG/50ML IV EMUL
INTRAVENOUS | Status: DC | PRN
  Administered 2020-09-10: 75 ug/kg/min via INTRAVENOUS

## 2020-09-10 MED ORDER — SODIUM CHLORIDE (PF) 0.9 % IJ SOLN
INTRAMUSCULAR | Status: AC
  Filled 2020-09-10: qty 50

## 2020-09-10 MED ORDER — ORAL CARE MOUTH RINSE: 15.0000 mL | Freq: Once | OROMUCOSAL | Status: AC

## 2020-09-10 MED ORDER — BUPIVACAINE IN DEXTROSE 0.75-8.25 % IT SOLN
INTRATHECAL | Status: DC | PRN
Start: 2020-09-10 — End: 2020-09-10
  Administered 2020-09-10: 2 mL via INTRATHECAL

## 2020-09-10 MED ORDER — MEPERIDINE HCL 50 MG/ML IJ SOLN: 6.2500 mg | INTRAMUSCULAR | Status: DC | PRN

## 2020-09-10 MED ORDER — POVIDONE-IODINE 10 % EX SWAB: 2.0000 "application " | Freq: Once | CUTANEOUS | Status: DC

## 2020-09-10 MED ORDER — ROPIVACAINE HCL 5 MG/ML IJ SOLN
INTRAMUSCULAR | Status: DC | PRN
  Administered 2020-09-10: 30 mL via PERINEURAL

## 2020-09-10 MED ORDER — POLYETHYLENE GLYCOL 3350 17 G PO PACK: 17.0000 g | PACK | Freq: Every day | ORAL | Status: DC | PRN

## 2020-09-10 MED ORDER — ONDANSETRON HCL 4 MG PO TABS: 4.0000 mg | ORAL_TABLET | Freq: Three times a day (TID) | ORAL | 0 refills | Status: DC | PRN

## 2020-09-10 MED ORDER — DIPHENHYDRAMINE HCL 12.5 MG/5ML PO ELIX: 12.5000 mg | ORAL_SOLUTION | ORAL | Status: DC | PRN

## 2020-09-10 MED ORDER — ISOPROPYL ALCOHOL 70 % SOLN
Status: DC | PRN
  Administered 2020-09-10: 1 via TOPICAL

## 2020-09-10 MED ORDER — DEXMEDETOMIDINE (PRECEDEX) IN NS 20 MCG/5ML (4 MCG/ML) IV SYRINGE
PREFILLED_SYRINGE | INTRAVENOUS | Status: DC | PRN
  Administered 2020-09-10: 10 ug via INTRAVENOUS

## 2020-09-10 MED ORDER — PROPOFOL 500 MG/50ML IV EMUL
INTRAVENOUS | Status: DC | PRN
  Administered 2020-09-10 (×3): 50 mg via INTRAVENOUS

## 2020-09-10 MED ORDER — ONDANSETRON HCL 4 MG/2ML IJ SOLN
INTRAMUSCULAR | Status: DC | PRN
  Administered 2020-09-10: 4 mg via INTRAVENOUS

## 2020-09-10 MED ORDER — LACTATED RINGERS IV BOLUS
250.0000 mL | Freq: Once | INTRAVENOUS | Status: AC
  Administered 2020-09-10: 250 mL via INTRAVENOUS

## 2020-09-10 MED ORDER — CEFAZOLIN SODIUM-DEXTROSE 2-4 GM/100ML-% IV SOLN
2.0000 g | INTRAVENOUS | Status: AC
  Administered 2020-09-10: 2 g via INTRAVENOUS
  Filled 2020-09-10: qty 100

## 2020-09-10 MED ORDER — MIDAZOLAM HCL 2 MG/2ML IJ SOLN
INTRAMUSCULAR | Status: AC
  Filled 2020-09-10: qty 2

## 2020-09-10 MED ORDER — ACETAMINOPHEN 325 MG PO TABS
325.0000 mg | ORAL_TABLET | Freq: Four times a day (QID) | ORAL | Status: DC | PRN
Start: 2020-09-11 — End: 2020-09-11

## 2020-09-10 MED ORDER — FENTANYL CITRATE (PF) 100 MCG/2ML IJ SOLN
25.0000 ug | INTRAMUSCULAR | Status: DC | PRN
  Administered 2020-09-10: 25 ug via INTRAVENOUS
  Administered 2020-09-10 (×2): 50 ug via INTRAVENOUS
  Administered 2020-09-10: 25 ug via INTRAVENOUS

## 2020-09-10 MED ORDER — ALUM & MAG HYDROXIDE-SIMETH 200-200-20 MG/5ML PO SUSP: 30.0000 mL | ORAL | Status: DC | PRN

## 2020-09-10 MED ORDER — 0.9 % SODIUM CHLORIDE (POUR BTL) OPTIME
TOPICAL | Status: DC | PRN
  Administered 2020-09-10: 1000 mL

## 2020-09-10 MED ORDER — PROPOFOL 1000 MG/100ML IV EMUL
INTRAVENOUS | Status: AC
  Filled 2020-09-10: qty 100

## 2020-09-10 MED ORDER — POVIDONE-IODINE 10 % EX SWAB
2.0000 "application " | Freq: Once | CUTANEOUS | Status: AC
  Administered 2020-09-10: 2 via TOPICAL

## 2020-09-10 MED ORDER — SODIUM CHLORIDE 0.9 % IR SOLN
Status: DC | PRN
  Administered 2020-09-10: 1000 mL

## 2020-09-10 MED ORDER — MORPHINE SULFATE (PF) 2 MG/ML IV SOLN
2.0000 mg | INTRAVENOUS | Status: DC | PRN
Start: 2020-09-10 — End: 2020-09-11
  Administered 2020-09-11: 2 mg via INTRAVENOUS
  Filled 2020-09-10: qty 1

## 2020-09-10 MED ORDER — BUPIVACAINE-EPINEPHRINE 0.25% -1:200000 IJ SOLN
INTRAMUSCULAR | Status: DC | PRN
  Administered 2020-09-10: 30 mL

## 2020-09-10 SURGICAL SUPPLY — 72 items
BAG ZIPLOCK 12X15 (MISCELLANEOUS) IMPLANT
BATTERY INSTRU NAVIGATION (MISCELLANEOUS) ×6 IMPLANT
BLADE SAW RECIPROCATING 77.5 (BLADE) ×2 IMPLANT
BNDG ELASTIC 4X5.8 VLCR STR LF (GAUZE/BANDAGES/DRESSINGS) ×2 IMPLANT
BNDG ELASTIC 6X5.8 VLCR STR LF (GAUZE/BANDAGES/DRESSINGS) ×2 IMPLANT
CHLORAPREP W/TINT 26 (MISCELLANEOUS) ×4 IMPLANT
COVER SURGICAL LIGHT HANDLE (MISCELLANEOUS) ×2 IMPLANT
COVER WAND RF STERILE (DRAPES) IMPLANT
CUFF TOURN SGL QUICK 34 (TOURNIQUET CUFF) ×2
CUFF TRNQT CYL 34X4.125X (TOURNIQUET CUFF) ×1 IMPLANT
DECANTER SPIKE VIAL GLASS SM (MISCELLANEOUS) ×4 IMPLANT
DERMABOND ADVANCED (GAUZE/BANDAGES/DRESSINGS) ×2
DERMABOND ADVANCED .7 DNX12 (GAUZE/BANDAGES/DRESSINGS) ×2 IMPLANT
DRAPE SHEET LG 3/4 BI-LAMINATE (DRAPES) ×6 IMPLANT
DRAPE U-SHAPE 47X51 STRL (DRAPES) ×2 IMPLANT
DRSG AQUACEL AG ADV 3.5X10 (GAUZE/BANDAGES/DRESSINGS) ×2 IMPLANT
DRSG AQUACEL AG ADV 3.5X14 (GAUZE/BANDAGES/DRESSINGS) ×2 IMPLANT
DRSG TEGADERM 4X4.75 (GAUZE/BANDAGES/DRESSINGS) IMPLANT
ELECT BLADE TIP CTD 4 INCH (ELECTRODE) ×2 IMPLANT
ELECT REM PT RETURN 15FT ADLT (MISCELLANEOUS) ×2 IMPLANT
EVACUATOR 1/8 PVC DRAIN (DRAIN) IMPLANT
GAUZE SPONGE 4X4 12PLY STRL (GAUZE/BANDAGES/DRESSINGS) ×2 IMPLANT
GLOVE BIO SURGEON STRL SZ8.5 (GLOVE) ×4 IMPLANT
GLOVE SRG 8 PF TXTR STRL LF DI (GLOVE) ×2 IMPLANT
GLOVE SURG ENC TEXT LTX SZ7.5 (GLOVE) ×6 IMPLANT
GLOVE SURG UNDER POLY LF SZ8 (GLOVE) ×4
GLOVE SURG UNDER POLY LF SZ8.5 (GLOVE) ×2 IMPLANT
GOWN SPEC L3 XXLG W/TWL (GOWN DISPOSABLE) ×2 IMPLANT
GOWN SPEC L4 XLG W/TWL (GOWN DISPOSABLE) ×2 IMPLANT
HANDPIECE INTERPULSE COAX TIP (DISPOSABLE) ×2
HOLDER FOLEY CATH W/STRAP (MISCELLANEOUS) ×2 IMPLANT
HOOD PEEL AWAY FLYTE STAYCOOL (MISCELLANEOUS) ×6 IMPLANT
INSERT TIB BEAR TRIATH SZ3 13 (Insert) ×2 IMPLANT
JET LAVAGE IRRISEPT WOUND (IRRIGATION / IRRIGATOR) ×2
KIT TURNOVER KIT A (KITS) ×2 IMPLANT
KNEE FEMORAL COMP RT RETAIN (Knees) ×2 IMPLANT
KNEE PATELLA ASYMMETRIC 9X29 (Knees) ×2 IMPLANT
KNEE TIBIAL COMPONENT SZ3 (Knees) ×2 IMPLANT
LAVAGE JET IRRISEPT WOUND (IRRIGATION / IRRIGATOR) ×1 IMPLANT
MARKER SKIN DUAL TIP RULER LAB (MISCELLANEOUS) ×2 IMPLANT
NDL SAFETY ECLIPSE 18X1.5 (NEEDLE) ×1 IMPLANT
NEEDLE HYPO 18GX1.5 SHARP (NEEDLE) ×2
NEEDLE SPNL 18GX3.5 QUINCKE PK (NEEDLE) ×2 IMPLANT
NS IRRIG 1000ML POUR BTL (IV SOLUTION) ×2 IMPLANT
PACK TOTAL KNEE CUSTOM (KITS) ×2 IMPLANT
PADDING CAST COTTON 6X4 STRL (CAST SUPPLIES) ×2 IMPLANT
PADDING CAST SYN 6 (CAST SUPPLIES) ×1
PADDING CAST SYNTHETIC 6X4 NS (CAST SUPPLIES) ×1 IMPLANT
PENCIL SMOKE EVACUATOR (MISCELLANEOUS) IMPLANT
PIN FLUTED HEDLESS FIX 3.5X1/8 (PIN) ×2 IMPLANT
PROTECTOR NERVE ULNAR (MISCELLANEOUS) ×2 IMPLANT
SAW OSC TIP CART 19.5X105X1.3 (SAW) ×2 IMPLANT
SEALER BIPOLAR AQUA 6.0 (INSTRUMENTS) ×2 IMPLANT
SET HNDPC FAN SPRY TIP SCT (DISPOSABLE) ×1 IMPLANT
SET PAD KNEE POSITIONER (MISCELLANEOUS) ×2 IMPLANT
SPONGE DRAIN TRACH 4X4 STRL 2S (GAUZE/BANDAGES/DRESSINGS) IMPLANT
SUT MNCRL AB 3-0 PS2 18 (SUTURE) ×2 IMPLANT
SUT MNCRL AB 4-0 PS2 18 (SUTURE) ×2 IMPLANT
SUT MON AB 2-0 CT1 36 (SUTURE) ×2 IMPLANT
SUT STRATAFIX PDO 1 14 VIOLET (SUTURE) ×2
SUT STRATFX PDO 1 14 VIOLET (SUTURE) ×1
SUT VIC AB 1 CTX 36 (SUTURE) ×4
SUT VIC AB 1 CTX36XBRD ANBCTR (SUTURE) ×2 IMPLANT
SUT VIC AB 2-0 CT1 27 (SUTURE) ×2
SUT VIC AB 2-0 CT1 TAPERPNT 27 (SUTURE) ×1 IMPLANT
SUTURE STRATFX PDO 1 14 VIOLET (SUTURE) ×1 IMPLANT
SYR 3ML LL SCALE MARK (SYRINGE) ×2 IMPLANT
TOWER CARTRIDGE SMART MIX (DISPOSABLE) IMPLANT
TRAY FOLEY MTR SLVR 16FR STAT (SET/KITS/TRAYS/PACK) IMPLANT
TUBE SUCTION HIGH CAP CLEAR NV (SUCTIONS) ×2 IMPLANT
WATER STERILE IRR 1000ML POUR (IV SOLUTION) ×4 IMPLANT
WRAP KNEE MAXI GEL POST OP (GAUZE/BANDAGES/DRESSINGS) ×2 IMPLANT

## 2020-09-10 NOTE — Op Note (Signed)
OPERATIVE REPORT  SURGEON: Rod Can, MD   ASSISTANT: Cherlynn June, PA-C  PREOPERATIVE DIAGNOSIS: Right knee arthritis.   POSTOPERATIVE DIAGNOSIS: Right knee arthritis.   PROCEDURE: Right total knee arthroplasty.   IMPLANTS: Stryker Triathlon CR femur, size 4. Stryker Tritanium tibia, size 3. X3 polyethelyene insert, size 13 mm, CS. 3 button asymmetric patella, size 29 mm.  ANESTHESIA:  MAC, Regional and Spinal  TOURNIQUET TIME: Not utilized.   ESTIMATED BLOOD LOSS: 350 mL.    ANTIBIOTICS: 2g Ancef.  DRAINS: None.  COMPLICATIONS: None   CONDITION: PACU - hemodynamically stable.   BRIEF CLINICAL NOTE: Katie Woodard is a 55 y.o. female with a long-standing history of Right knee arthritis. After failing conservative management, the patient was indicated for total knee arthroplasty. The risks, benefits, and alternatives to the procedure were explained, and the patient elected to proceed.  PROCEDURE IN DETAIL: Adductor canal block was obtained in the pre-op holding area. Once inside the operative room, spinal anesthesia was obtained, and a foley catheter was inserted. The patient was then positioned, a nonsterile tourniquet was placed, and the lower extremity was prepped and draped in the normal sterile surgical fashion.  A time-out was called verifying side and site of surgery. The patient received IV antibiotics within 60 minutes of beginning the procedure. The tourniquet was not utilized.   An anterior approach to the knee was performed utilizing a midvastus arthrotomy. A medial release was performed and the patellar fat pad was excised. Stryker navigation was used to cut the distal femur perpendicular to the mechanical axis. A freehand patellar resection was performed, and the patella was sized an prepared with 3 lug holes.  Nagivation was used to make a neutral proximal tibia resection, taking 6 mm of bone from  the less affected lateral side with 3 degrees of slope. The menisci were excised. A spacer block was placed, and the alignment and balance in extension were confirmed.   The distal femur was sized using the 3-degree external rotation guide referencing the posterior femoral cortex. The appropriate 4-in-1 cutting block was pinned into place. Rotation was checked using Whiteside's line, the epicondylar axis, and then confirmed with a spacer block in flexion. The remaining femoral cuts were performed, taking care to protect the MCL.  The tibia was sized and the trial tray was pinned into place. The remaining trail components were inserted. The knee was stable to varus and valgus stress through a full range of motion. The patella tracked centrally, and the PCL was well balanced. The trial components were removed, and the proximal tibial surface was prepared. Final components were impacted into place. The knee was tested for a final time and found to be well balanced.   The wound was copiously irrigated with Irrisept solution and normal saline using pule lavage.  Marcaine solution was injected into the periarticular soft tissue.  The wound was closed in layers using #1 Vicryl and Stratafix for the fascia, 2-0 Vicryl for the subcutaneous fat, 2-0 Monocryl for the deep dermal layer, 3-0 running Monocryl subcuticular Stitch, and 4-0 Monocryl stay sutures at both ends of the wound. Dermabond was applied to the skin.  Once the glue was fully dried, an Aquacell Ag and compressive dressing were applied.  Tthe patient was transported to the recovery room in stable condition.  Sponge, needle, and instrument counts were correct at the end of the case x2.  The patient tolerated the procedure well and there were no known complications.  Please note that  a surgical assistant was a medical necessity for this procedure in order to perform it in a safe and expeditious manner. Surgical assistant was necessary to retract the  ligaments and vital neurovascular structures to prevent injury to them and also necessary for proper positioning of the limb to allow for anatomic placement of the prosthesis.

## 2020-09-10 NOTE — Transfer of Care (Signed)
Immediate Anesthesia Transfer of Care Note  Patient: Katie Woodard  Procedure(s) Performed: COMPUTER ASSISTED TOTAL KNEE ARTHROPLASTY (Right Knee)  Patient Location: PACU  Anesthesia Type:Spinal  Level of Consciousness: awake, alert  and oriented  Airway & Oxygen Therapy: Patient Spontanous Breathing and Patient connected to face mask  Post-op Assessment: Report given to RN and Post -op Vital signs reviewed and stable  Post vital signs: Reviewed, stable  Last Vitals:  Vitals Value Taken Time  BP 131/85 09/10/20 1524  Temp    Pulse 85 09/10/20 1528  Resp 22 09/10/20 1528  SpO2 100 % 09/10/20 1528  Vitals shown include unvalidated device data.  Last Pain:  Vitals:   09/10/20 0927  TempSrc: Oral  PainSc:          Complications: No complications documented.

## 2020-09-10 NOTE — Anesthesia Postprocedure Evaluation (Signed)
Anesthesia Post Note  Patient: Katie Woodard  Procedure(s) Performed: COMPUTER ASSISTED TOTAL KNEE ARTHROPLASTY (Right Knee)     Patient location during evaluation: PACU Anesthesia Type: Regional, MAC and Spinal Level of consciousness: awake and alert and oriented Pain management: pain level controlled Vital Signs Assessment: post-procedure vital signs reviewed and stable Respiratory status: spontaneous breathing, nonlabored ventilation and respiratory function stable Cardiovascular status: blood pressure returned to baseline and stable Postop Assessment: no headache, no backache, spinal receding, patient able to bend at knees and no apparent nausea or vomiting Anesthetic complications: no   No complications documented.  Last Vitals:  Vitals:   09/10/20 1525 09/10/20 1530  BP: 131/85 (!) 141/85  Pulse: 86 86  Resp: 20 (!) 25  Temp: 36.6 C   SpO2:  100%    Last Pain:  Vitals:   09/10/20 0927  TempSrc: Oral  PainSc:                  [name]

## 2020-09-10 NOTE — H&P (Signed)
TOTAL KNEE ADMISSION H&P  Patient is being admitted for right total knee arthroplasty.  Subjective:  Chief Complaint:right knee pain.  HPI: Katie Woodard, 55 y.o. female, has a history of pain and functional disability in the right knee due to arthritis and has failed non-surgical conservative treatments for greater than 12 weeks to includeNSAID's and/or analgesics, corticosteriod injections, flexibility and strengthening excercises, supervised PT with diminished ADL's post treatment, use of assistive devices, weight reduction as appropriate and activity modification.  Onset of symptoms was gradual, starting >10 years ago with gradually worsening course since that time. The patient noted prior procedures on the knee to include  arthroscopy on the right knee(s).  Patient currently rates pain in the right knee(s) at 10 out of 10 with activity. Patient has night pain, worsening of pain with activity and weight bearing, pain that interferes with activities of daily living, pain with passive range of motion, crepitus and joint swelling.  Patient has evidence of subchondral cysts, subchondral sclerosis, periarticular osteophytes, joint subluxation and joint space narrowing by imaging studies. There is no active infection.  Patient Active Problem List   Diagnosis Date Noted  . S/P laparoscopic-assisted sigmoidectomy 02/13/2020  . Diverticulitis of sigmoid colon 01/23/2019  . GERD (gastroesophageal reflux disease)   . Hypokalemia   . Abdominal pain 01/31/2014  . Abdominal pain, chronic, right lower quadrant 01/31/2014  . OBESITY 11/11/2009  . ABSCESS, TOOTH 11/11/2009  . DIVERTICULITIS OF COLON 02/26/2009  . RECTAL BLEEDING 02/12/2009  . ABDOMINAL PAIN, CHRONIC 02/12/2009  . Microcytic anemia 11/13/2008  . MIGRAINE HEADACHE 11/13/2008  . HEMOCCULT POSITIVE STOOL 11/13/2008  . MICROSCOPIC HEMATURIA 11/13/2008  . VAGINITIS, BACTERIAL 11/13/2008  . GERD 10/18/2008   Past Medical History:   Diagnosis Date  . Chronic kidney disease    mases in kidney  . DDD (degenerative disc disease), cervical   . Diverticulitis   . GERD (gastroesophageal reflux disease)    Takes OTC meds  . Headache    migraines twice a week  . History of blood transfusion   . Ovarian cyst   . Uterine fibroid     Past Surgical History:  Procedure Laterality Date  . APPENDECTOMY    . CESAREAN SECTION  1988  . CHOLECYSTECTOMY  2005   [place] [facility]  01/2020  . FLEXIBLE SIGMOIDOSCOPY N/A 02/13/2020   Procedure: FLEXIBLE SIGMOIDOSCOPY;  Surgeon: Ileana Roup, MD;  Location: [facility];  Service: General;  Laterality: N/A;  . LAPAROSCOPIC APPENDECTOMY N/A 01/31/2014   Procedure: APPENDECTOMY LAPAROSCOPIC;  Surgeon: Gwenyth Ober, MD;  Location: [facility];  Service: General;  Laterality: N/A;    Current Facility-Administered Medications  Medication Dose Route Frequency Provider Last Rate Last Admin  . 0.9 %  sodium chloride infusion   Intravenous Continuous Cherlynn June B, PA      . acetaminophen (OFIRMEV) IV 1,000 mg  1,000 mg Intravenous Once [place], Larry B, [place]      . ceFAZolin (ANCEF) IVPB 2g/100 mL premix  2 g Intravenous On Call to OR Dorothyann Peng, PA      . fentaNYL (SUBLIMAZE) injection 50-100 mcg  50-100 mcg Intravenous UD Nunzio Cobbs M, DO   50 mcg at 09/10/20 1011  . lactated ringers infusion   Intravenous Continuous Suzette Battiest, MD      . lactated ringers infusion   Intravenous Continuous [name], DO 75 mL/hr at 09/10/20 7782 New Bag at 09/10/20 0923  . povidone-iodine 10 % swab 2  application  2 application Topical Once Dorothyann Peng, [place]      . tranexamic acid (CYKLOKAPRON) IVPB 1,000 mg  1,000 mg Intravenous To OR Dorothyann Peng, PA       Facility-Administered Medications Ordered in Other Encounters  Medication Dose Route Frequency Provider Last Rate Last Admin  . dexamethasone (DECADRON) injection   Intravenous Anesthesia Intra-op [name], DO   10 mg at 09/10/20 1015  . ropivacaine (PF) 5 mg/mL (0.5%) (NAROPIN) injection   Peri-NEURAL Anesthesia Intra-op [name], DO   30 mL at 09/10/20 1015   Allergies  Allergen Reactions  . Dilaudid [Hydromorphone Hcl] Nausea And Vomiting and Other (See Comments)    " head spinning sensation"     Social History   Tobacco Use  . Smoking status: Never Smoker  . Smokeless tobacco: Never Used  Substance Use Topics  . Alcohol use: No    Family History  Problem Relation Age of Onset  . Cancer Mother        breast  . Breast cancer Mother        23s and again in her 72s  . Cancer Sister        breast x2  . Breast cancer Sister        early 43s and again in late 61s  . Cancer Maternal Aunt        ovarian     Review of Systems  Constitutional: Negative.   HENT: Negative.   Eyes: Negative.   Respiratory: Negative.   Cardiovascular: Negative.   Gastrointestinal: Negative.   Endocrine: Negative.   Genitourinary: Negative.   Musculoskeletal: Positive for arthralgias.  Skin: Negative.   Allergic/Immunologic: Negative.   Neurological: Negative.   Hematological: Negative.   Psychiatric/Behavioral: Negative.     Objective:  Physical Exam Constitutional:      Appearance: Normal appearance. She is obese.  HENT:     Head: Normocephalic and atraumatic.     Nose: Nose normal.     Mouth/Throat:     Mouth: Mucous membranes are dry.     Pharynx: Oropharynx is clear.  Eyes:     Extraocular Movements: Extraocular movements intact.     Conjunctiva/sclera: Conjunctivae normal.     Pupils: Pupils are equal, round, and reactive to light.  Cardiovascular:     Rate and Rhythm: Normal rate and regular rhythm.     Pulses: Normal pulses.  Pulmonary:     Effort: Pulmonary effort is normal.  Abdominal:     General: Abdomen is flat.     Palpations: Abdomen is soft.  Genitourinary:    Comments: deferred Musculoskeletal:     Cervical back: Normal range of  motion and neck supple.     Right knee: Deformity, effusion, bony tenderness and crepitus present. Decreased range of motion.  Skin:    General: Skin is warm and dry.     Capillary Refill: Capillary refill takes less than 2 seconds.  Neurological:     General: No focal deficit present.     Mental Status: She is alert.  Psychiatric:        Mood and Affect: Mood normal.        Behavior: Behavior normal.        Thought Content: Thought content normal.        Judgment: Judgment normal.     Vital signs in last 24 hours: Temp:  [98.9 F (37.2 C)] 98.9 F (37.2 C) (02/16 0927) Pulse Rate:  [  71-86] 71 (02/16 1018) Resp:  [18-26] 21 (02/16 1018) BP: (150-168)/(82-105) 150/82 (02/16 1018) SpO2:  [96 %-100 %] 98 % (02/16 1018) Weight:  [92.5 kg] 92.5 kg (02/16 0921)  Labs:   Estimated body mass index is 41.2 kg/m as calculated from the following:   Height as of 08/22/20: 4\' 11"  (1.499 m).   Weight as of this encounter: 92.5 kg.   Imaging Review Plain radiographs demonstrate severe degenerative joint disease of the right knee(s). The overall alignment issignificant varus. The bone quality appears to be adequate for age and reported activity level.      Assessment/Plan:  End stage arthritis, right knee   The patient history, physical examination, clinical judgment of the provider and imaging studies are consistent with end stage degenerative joint disease of the right knee(s) and total knee arthroplasty is deemed medically necessary. The treatment options including medical management, injection therapy arthroscopy and arthroplasty were discussed at length. The risks and benefits of total knee arthroplasty were presented and reviewed. The risks due to aseptic loosening, infection, stiffness, patella tracking problems, thromboembolic complications and other imponderables were discussed. The patient acknowledged the explanation, agreed to proceed with the plan and consent was signed.  Patient is being admitted for inpatient treatment for surgery, pain control, PT, OT, prophylactic antibiotics, VTE prophylaxis, progressive ambulation and ADL's and discharge planning. The patient is planning to be discharged home with OPPT     Patient's anticipated LOS is less than 2 midnights, meeting these requirements: - Younger than 70 - Lives within 1 hour of care - Has a competent adult at home to recover with post-op recover - NO history of  - Chronic pain requiring opiods  - Diabetes  - Coronary Artery Disease  - Heart failure  - Heart attack  - Stroke  - DVT/VTE  - Cardiac arrhythmia  - Respiratory Failure/COPD  - Renal failure  - Anemia  - Advanced Liver disease

## 2020-09-10 NOTE — Anesthesia Procedure Notes (Signed)
Procedure Name: MAC Date/Time: 09/10/2020 12:49 PM Performed by: Michele Rockers, CRNA Pre-anesthesia Checklist: Patient identified, Emergency Drugs available, Suction available, Timeout performed and Patient being monitored Patient Re-evaluated:Patient Re-evaluated prior to induction Oxygen Delivery Method: Simple face mask

## 2020-09-10 NOTE — Anesthesia Procedure Notes (Signed)
Anesthesia Regional Block: Adductor canal block   Pre-Anesthetic Checklist: ,, timeout performed, Correct Patient, Correct Site, Correct Laterality, Correct Procedure, Correct Position, site marked, Risks and benefits discussed,  Surgical consent,  Pre-op evaluation,  At surgeon's request and post-op pain management  Laterality: Right  Prep: Maximum Sterile Barrier Precautions used, chloraprep       Needles:  Injection technique: Single-shot  Needle Type: Echogenic Stimulator Needle     Needle Length: 9cm  Needle Gauge: 22     Additional Needles:   Procedures:,,,, ultrasound used (permanent image in chart),,,,  Narrative:  Start time: 09/10/2020 10:10 AM End time: 09/10/2020 10:15 AM Injection made incrementally with aspirations every 5 mL.  Performed by: Personally  Anesthesiologist: [name], DO  Additional Notes: Monitors applied. No increased pain on injection. No increased resistance to injection. Injection made in 5cc increments. Good needle visualization. Patient tolerated procedure well.

## 2020-09-10 NOTE — Progress Notes (Signed)
AssistedDr. Beth Finucane with right, ultrasound guided, adductor canal block. Side rails up, monitors on throughout procedure. See vital signs in flow sheet. Tolerated Procedure well.  

## 2020-09-10 NOTE — Anesthesia Preprocedure Evaluation (Addendum)
Anesthesia Evaluation  Patient identified by MRN, date of birth, ID band Patient awake    Reviewed: Allergy & Precautions, NPO status , Patient's Chart, lab work & pertinent test results  Airway Mallampati: III  TM Distance: >3 FB Neck ROM: Full    Dental no notable dental hx. (+) Teeth Intact, Dental Advisory Given   Pulmonary  COVID 15d ago- asymptomatic    Pulmonary exam normal breath sounds clear to auscultation       Cardiovascular hypertension, Pt. on medications Normal cardiovascular exam Rhythm:Regular Rate:Normal     Neuro/Psych  Headaches, negative psych ROS   GI/Hepatic Neg liver ROS, GERD  Medicated and Controlled,  Endo/Other  Morbid obesityBMI 41  Renal/GU CRFRenal disease  negative genitourinary   Musculoskeletal  (+) Arthritis , Osteoarthritis,  R knee DJD   Abdominal (+) + obese,   Peds  Hematology negative hematology ROS (+) hct 42.7, plt 325   Anesthesia Other Findings   Reproductive/Obstetrics negative OB ROS                           Anesthesia Physical Anesthesia Plan  ASA: III  Anesthesia Plan: Spinal, Regional and MAC   Post-op Pain Management:  Regional for Post-op pain   Induction:   PONV Risk Score and Plan: 2 and Propofol infusion and TIVA  Airway Management Planned: Natural Airway and Nasal Cannula  Additional Equipment: None  Intra-op Plan:   Post-operative Plan:   Informed Consent: I have reviewed the patients History and Physical, chart, labs and discussed the procedure including the risks, benefits and alternatives for the proposed anesthesia with the patient or authorized representative who has indicated his/her understanding and acceptance.       Plan Discussed with: CRNA  Anesthesia Plan Comments:         Anesthesia Quick Evaluation

## 2020-09-10 NOTE — Anesthesia Procedure Notes (Signed)
Spinal  Patient location during procedure: OR Start time: 09/10/2020 12:53 PM End time: 09/10/2020 1:03 PM Staffing Performed: anesthesiologist  Anesthesiologist: [name], DO Preanesthetic Checklist Completed: patient identified, IV checked, risks and benefits discussed, surgical consent, monitors and equipment checked, pre-op evaluation and timeout performed Spinal Block Patient position: sitting Prep: DuraPrep and site prepped and draped Patient monitoring: cardiac monitor, continuous pulse ox and blood pressure Approach: midline Location: L3-4 Injection technique: single-shot Needle Needle type: Pencan  Needle gauge: 24 G Needle length: 9 cm Assessment Sensory level: T6 Additional Notes Functioning IV was confirmed and monitors were applied. Sterile prep and drape, including hand hygiene and sterile gloves were used. The patient was positioned and the spine was prepped. The skin was anesthetized with lidocaine.  Free flow of clear CSF was obtained prior to injecting local anesthetic into the CSF.  The spinal needle aspirated freely following injection.  The needle was carefully withdrawn.  The patient tolerated the procedure well.

## 2020-09-10 NOTE — Evaluation (Addendum)
Physical Therapy Evaluation Patient Details Name: Katie Woodard MRN: [id number] DOB: 1966-02-03 Today's Date: 09/10/2020   History of Present Illness  Patient is 55 y.o. female s/p Rt TKA on 09/10/20 with PMH significant for OA, CKD, GERD, migraines.  Clinical Impression  Pt is a 55y.o. female s/p Rt TKA POD 0. Pt reports that she is independent with mobility at baseline. Pt required MIN assist for stability and cues for safe hand placement with sit to stand transfers. Pt required manual Rt knee block from therapist in standing and with pre gait marching to promote knee extension due to knee buckling with weight bearing. PT deferred further mobility 2/2 safety concerns due to buckling. PT reviewed therapeutic intervention for promotion of DVT prevention, pt demonstrated understanding. Pt will have assist from her husband, daughter, and grandchildren upon discharge. Pt is currently not at a safe mobility level for discharge.Pt will benefit from skilled PT to increase independence and safety with mobility. Acute therapy to follow up durign stay.       Follow Up Recommendations Follow surgeon's recommendation for DC plan and follow-up therapies;Outpatient PT    Equipment Recommendations  Rolling walker with 5" wheels    Recommendations for Other Services       Precautions / Restrictions Precautions Precautions: Fall Restrictions Weight Bearing Restrictions: No Other Position/Activity Restrictions: WBAT      Mobility  Bed Mobility Overal bed mobility: Needs Assistance Bed Mobility: Supine to Sit;Sit to Supine     Supine to sit: Supervision;HOB elevated Sit to supine: Min assist;HOB elevated   General bed mobility comments: pt with use of bedrails and B UEs to scoot to EOB and close supervision for safety. Pt required MIN assist with Rt LE back onto bed for sit to supine transfer.    Transfers Overall transfer level: Needs assistance Equipment used: Rolling walker (2  wheeled) Transfers: Sit to/from Stand Sit to Stand: Min assist         General transfer comment: pt with MIN assist for safety and stability with sit to stand transfer. Pt required manual Rt knee block to promote knee extension in standing and with pre gait marching. Pt demonstrated significant Rt knee flexion with pre gait marching with no LOB.  Ambulation/Gait                Stairs            Wheelchair Mobility    Modified Rankin (Stroke Patients Only)       Balance Overall balance assessment: Needs assistance Sitting-balance support: Feet supported Sitting balance-Leahy Scale: Good     Standing balance support: Bilateral upper extremity supported;During functional activity Standing balance-Leahy Scale: Poor Standing balance comment: use of external support                             Pertinent Vitals/Pain Pain Assessment: 0-10 Pain Score: 7  Pain Location: Rt knee Pain Descriptors / Indicators: Discomfort;Sore;Tender Pain Intervention(s): Limited activity within patient's tolerance;Monitored during session;Repositioned;Ice applied    Home Living Family/patient expects to be discharged to:: Private residence Living Arrangements: Spouse/significant other;Children Available Help at Discharge: Family Type of Home: House Home Access: Stairs to enter Entrance Stairs-Rails: None Technical brewer of Steps: 2 Home Layout: One level Home Equipment: None Additional Comments: pt's will have assist from husband, daughter, and 3 teenage grandchildren 2    Prior Function Level of Independence: Independent  Hand Dominance   Dominant Hand: Left    Extremity/Trunk Assessment   Upper Extremity Assessment Upper Extremity Assessment: Overall WFL for tasks assessed    Lower Extremity Assessment Lower Extremity Assessment: RLE deficits/detail RLE Deficits / Details: pt with 3/5 Rt dorsi/plantar flexion strength and fair Rt  quad set strength. Pt unable to initiate Rt SLR 2/2 pain and weakness. RLE Sensation: WNL RLE Coordination: WNL    Cervical / Trunk Assessment Cervical / Trunk Assessment: Normal  Communication   Communication: No difficulties  Cognition Arousal/Alertness: Awake/alert Behavior During Therapy: WFL for tasks assessed/performed Overall Cognitive Status: Within Functional Limits for tasks assessed                                        General Comments General comments (skin integrity, edema, etc.): pt reported feeling nauseous during session and experienced 1 bout of emesis while EOB with slight improvement in nausea.    Exercises Total Joint Exercises Ankle Circles/Pumps: AROM;Both;10 reps;Supine   Assessment/Plan    PT Assessment Patient needs continued PT services  PT Problem List Decreased strength;Decreased range of motion;Decreased activity tolerance;Decreased balance;Decreased mobility;Decreased coordination;Decreased knowledge of use of DME;Pain       PT Treatment Interventions DME instruction;Gait training;Stair training;Functional mobility training;Therapeutic exercise;Therapeutic activities;Balance training;Patient/family education    PT Goals (Current goals can be found in the Care Plan section)  Acute Rehab PT Goals Patient Stated Goal: none stated PT Goal Formulation: With patient Time For Goal Achievement: 09/17/20 Potential to Achieve Goals: Good    Frequency 7X/week   Barriers to discharge        Co-evaluation               AM-PAC PT "6 Clicks" Mobility  Outcome Measure Help needed turning from your back to your side while in a flat bed without using bedrails?: None Help needed moving from lying on your back to sitting on the side of a flat bed without using bedrails?: A Little Help needed moving to and from a bed to a chair (including a wheelchair)?: A Lot Help needed standing up from a chair using your arms (e.g., wheelchair or  bedside chair)?: A Little Help needed to walk in hospital room?: A Lot Help needed climbing 3-5 steps with a railing? : A Lot 6 Click Score: 16    End of Session Equipment Utilized During Treatment: Gait belt Activity Tolerance: Patient tolerated treatment well Patient left: in bed;with call bell/phone within reach Nurse Communication: Mobility status PT Visit Diagnosis: Unsteadiness on feet (R26.81);Muscle weakness (generalized) (M62.81);Pain;Other abnormalities of gait and mobility (R26.89) Pain - Right/Left: Right Pain - part of body: Knee    Time: 2595-6387 PT Time Calculation (min) (ACUTE ONLY): 22 min   Charges:            Elna Breslow, SPT  Acute rehab    Elna Breslow 09/10/2020, 6:48 PM

## 2020-09-11 ENCOUNTER — Encounter (HOSPITAL_COMMUNITY): Payer: Self-pay | Admitting: Orthopedic Surgery

## 2020-09-11 LAB — BASIC METABOLIC PANEL
Anion gap: 11 (ref 5–15)
BUN: 9 mg/dL (ref 6–20)
CO2: 24 mmol/L (ref 22–32)
Calcium: 8.9 mg/dL (ref 8.9–10.3)
Chloride: 104 mmol/L (ref 98–111)
Creatinine, Ser: 0.68 mg/dL (ref 0.44–1.00)
GFR, Estimated: >60 mL/min (ref >60)
Glucose, Bld: 155 mg/dL — ABNORMAL HIGH (ref 70–99)
Potassium: 3.7 mmol/L (ref 3.5–5.1)
Sodium: 139 mmol/L (ref 135–145)

## 2020-09-11 LAB — CBC
HCT: 32.1 % — ABNORMAL LOW (ref 36.0–46.0)
Hemoglobin: 9.7 g/dL — ABNORMAL LOW (ref 12.0–15.0)
MCH: 21.3 pg — ABNORMAL LOW (ref 26.0–34.0)
MCHC: 30.2 g/dL (ref 30.0–36.0)
MCV: 70.5 fL — ABNORMAL LOW (ref 80.0–100.0)
Platelets: 282 10*3/uL (ref 150–400)
RBC: 4.55 MIL/uL (ref 3.87–5.11)
RDW: 18.2 % — ABNORMAL HIGH (ref 11.5–15.5)
WBC: 13.9 10*3/uL — ABNORMAL HIGH (ref 4.0–10.5)
nRBC: 0 % (ref 0.0–0.2)

## 2020-09-11 NOTE — TOC Transition Note (Signed)
Transition of Care [facility]) - CM/SW Discharge Note   Patient Details  Name: Katie Woodard MRN: [id number] Date of Birth: Jun 22, 1966  [facility] CM/SW Contact:  Lennart Pall, LCSW Phone Number: 09/11/2020, 1:17 PM   Clinical Narrative:    Met with pt to review dc needs.  Pt anticipates dc home today with spouse and two children able to assist.  Confirmed pt does need rw - ordered via [place].  She plans for OPPT at [facility].  No further TOC needs.   Final next level of care: OP Rehab Barriers to Discharge: No Barriers Identified   Patient Goals and CMS Choice Patient states their goals for this hospitalization and ongoing recovery are:: go home      Discharge Placement                       Discharge Plan and Services                DME Arranged: Gilford Rile youth DME Agency: [employer] Date DME Agency Contacted: 09/11/20 Time DME Agency Contacted: 1886 Representative spoke with at DME Agency: [facility] ([place]) Interventions     Readmission Risk Interventions No flowsheet data found.

## 2020-09-11 NOTE — Discharge Summary (Signed)
Physician Discharge Summary  Patient ID: Katie Woodard MRN: [id number] DOB/AGE: 1966-05-04 55 y.o.  Admit date: 09/10/2020 Discharge date: 09/11/2020  Admission Diagnoses:  Osteoarthritis of the knee, right  Discharge Diagnoses:  Active Problems:   S/P total knee replacement, right   Past Medical History:  Diagnosis Date  . Chronic kidney disease    mases in kidney  . DDD (degenerative disc disease), cervical   . Diverticulitis   . GERD (gastroesophageal reflux disease)    Takes OTC meds  . Headache    migraines twice a week  . History of blood transfusion   . Ovarian cyst   . Uterine fibroid     Surgeries: Procedure(s): COMPUTER ASSISTED TOTAL KNEE ARTHROPLASTY on 09/10/2020   Consultants (if any):   Discharged Condition: Improved  Hospital Course: Katie Woodard is an 55 y.o. female who was admitted 09/10/2020 with a diagnosis of osteoarthritis of the knee, right, and went to the operating room on 09/10/2020 and underwent the above named procedures.    She was given perioperative antibiotics:  Anti-infectives (From admission, onward)   Start     Dose/Rate Route Frequency Ordered Stop   09/10/20 0915  ceFAZolin (ANCEF) IVPB 2g/100 mL premix        2 g 200 mL/hr over 30 Minutes Intravenous On call to O.R. 09/10/20 0901 09/10/20 1300    .  She was given sequential compression devices, early ambulation, and aspirin for DVT prophylaxis.  She benefited maximally from the hospital stay and there were no complications.    Recent vital signs:  Vitals:   09/11/20 0622 09/11/20 0948  BP: 137/83 (!) 152/90  Pulse: 92 83  Resp: 16 16  Temp: 98.1 F (36.7 C) 97.9 F (36.6 C)  SpO2: 99% 98%    Recent laboratory studies:  Lab Results  Component Value Date   HGB 9.7 (L) 09/11/2020   HGB 13.2 09/10/2020   HGB 12.3 08/22/2020   Lab Results  Component Value Date   WBC 13.9 (H) 09/11/2020   PLT 282 09/11/2020   Lab Results  Component Value Date   INR 1.0  08/04/2020   Lab Results  Component Value Date   NA 139 09/11/2020   K 3.7 09/11/2020   CL 104 09/11/2020   CO2 24 09/11/2020   BUN 9 09/11/2020   CREATININE 0.68 09/11/2020   GLUCOSE 155 (H) 09/11/2020    Discharge Medications:   Allergies as of 09/11/2020      Reactions   Dilaudid [hydromorphone Hcl] Nausea And Vomiting, Other (See Comments)   " head spinning sensation"       Medication List    STOP taking these medications   acetaminophen 500 MG tablet Commonly known as: TYLENOL     TAKE these medications   amLODipine 5 MG tablet Commonly known as: NORVASC Take 1 tablet (5 mg total) by mouth daily.   aspirin 81 MG chewable tablet Commonly known as: Aspirin Childrens Chew 1 tablet (81 mg total) by mouth 2 (two) times daily with a meal.   cimetidine 200 MG tablet Commonly known as: TAGAMET Take 200 mg by mouth daily.   docusate sodium 100 MG capsule Commonly known as: Colace Take 1 capsule (100 mg total) by mouth 2 (two) times daily.   linaclotide 145 MCG Caps capsule Commonly known as: LINZESS Take 145 mcg by mouth daily before breakfast.   meloxicam 15 MG tablet Commonly known as: Mobic Take 1 tablet (15 mg total) by mouth daily.  ondansetron 4 MG tablet Commonly known as: Zofran Take 1 tablet (4 mg total) by mouth every 8 (eight) hours as needed for nausea or vomiting.   oxyCODONE-acetaminophen 5-325 MG tablet Commonly known as: Percocet Take 1 tablet by mouth every 4 (four) hours as needed for severe pain.   senna 8.6 MG Tabs tablet Commonly known as: SENOKOT Take 2 tablets (17.2 mg total) by mouth at bedtime.   Sinus + Headache 5-325 MG Tabs Generic drug: Phenylephrine-Acetaminophen Take 1 tablet by mouth daily as needed (allergies).       Diagnostic Studies: DG Knee Right Port  Result Date: 09/10/2020 CLINICAL DATA:  Right knee arthroplasty EXAM: PORTABLE RIGHT KNEE - 1-2 VIEW COMPARISON:  01/20/2018 FINDINGS: Frontal and cross-table  lateral views of the right knee are obtained. Cross-table lateral view is suboptimal due to oblique positioning. 3 component right knee arthroplasty is identified without evidence of acute complication. Postsurgical changes are seen within the soft tissues. IMPRESSION: 1. Unremarkable right knee arthroplasty as above. Electronically Signed   By: Randa Ngo M.D.   On: 09/10/2020 16:29    Disposition: Discharge disposition: 01-Home or Self Care     Dental Antibiotics:  In most cases prophylactic antibiotics for Dental procdeures after total joint surgery are not necessary.  Exceptions are as follows:  1. History of prior total joint infection  2. Severely immunocompromised (Organ Transplant, cancer chemotherapy, Rheumatoid biologic meds such as [place])  3. Poorly controlled diabetes (A1C &gt; 8.0, blood glucose over 200)  If you have one of these conditions, contact your surgeon for an antibiotic prescription, prior to your dental procedure.  Discharge Instructions    Call MD / Call 911   Complete by: As directed    If you experience chest pain or shortness of breath, CALL 911 and be transported to the hospital emergency room.  If you develope a fever above 101 F, pus (white drainage) or increased drainage or redness at the wound, or calf pain, call your surgeon's office.   Call MD / Call 911   Complete by: As directed    If you experience chest pain or shortness of breath, CALL 911 and be transported to the hospital emergency room.  If you develope a fever above 101 F, pus (white drainage) or increased drainage or redness at the wound, or calf pain, call your surgeon's office.   Constipation Prevention   Complete by: As directed    Drink plenty of fluids.  Prune juice may be helpful.  You may use a stool softener, such as Colace (over the counter) 100 mg twice a day.  Use MiraLax (over the counter) for constipation as needed.   Constipation Prevention   Complete by: As directed     Drink plenty of fluids.  Prune juice may be helpful.  You may use a stool softener, such as Colace (over the counter) 100 mg twice a day.  Use MiraLax (over the counter) for constipation as needed.   Diet - low sodium heart healthy   Complete by: As directed    Do not put a pillow under the knee. Place it under the heel.   Complete by: As directed    Do not put a pillow under the knee. Place it under the heel.   Complete by: As directed    Driving restrictions   Complete by: As directed    No driving for 6 weeks   Driving restrictions   Complete by: As directed    No  driving for 6 weeks   Increase activity slowly as tolerated   Complete by: As directed    Increase activity slowly as tolerated   Complete by: As directed    Lifting restrictions   Complete by: As directed    No lifting for 6 weeks   Lifting restrictions   Complete by: As directed    No lifting for 6 weeks   TED hose   Complete by: As directed    Use stockings (TED hose) for 2 weeks on both leg(s).  You may remove them at night for sleeping.   TED hose   Complete by: As directed    Use stockings (TED hose) for 2 weeks on both leg(s).  You may remove them at night for sleeping.       Follow-up Information    Swinteck, Aaron Edelman, MD. Schedule an appointment as soon as possible for a visit in 2 weeks.   Specialty: Orthopedic Surgery Why: For wound re-check Contact information: [address] [place] [address] [phone]                Signed: Dorothyann Peng 09/11/2020, 10:37 AM

## 2020-09-11 NOTE — Progress Notes (Signed)
Patent got up to the bedside commode this shift.

## 2020-09-11 NOTE — Progress Notes (Signed)
    Subjective:  Patient reports pain as moderate.  Denies N/V/CP/SOB. Patient up with therapy  Objective:   VITALS:   Vitals:   09/10/20 2327 09/11/20 0257 09/11/20 0622 09/11/20 0948  BP: (!) 154/90 (!) 150/78 137/83 (!) 152/90  Pulse: 75 83 92 83  Resp: 16 16 16 16   Temp: 98.6 F (37 C) 98.1 F (36.7 C) 98.1 F (36.7 C) 97.9 F (36.6 C)  TempSrc: Oral Oral Oral Oral  SpO2: 99% 97% 99% 98%  Weight:      Height:        NAD ABD soft Neurovascular intact Sensation intact distally Intact pulses distally Dorsiflexion/Plantar flexion intact Incision: dressing C/D/I   Lab Results  Component Value Date   WBC 13.9 (H) 09/11/2020   HGB 9.7 (L) 09/11/2020   HCT 32.1 (L) 09/11/2020   MCV 70.5 (L) 09/11/2020   PLT 282 09/11/2020   BMET    Component Value Date/Time   NA 139 09/11/2020 0311   K 3.7 09/11/2020 0311   CL 104 09/11/2020 0311   CO2 24 09/11/2020 0311   GLUCOSE 155 (H) 09/11/2020 0311   BUN 9 09/11/2020 0311   CREATININE 0.68 09/11/2020 0311   CALCIUM 8.9 09/11/2020 0311   GFRNONAA >60 09/11/2020 0311   GFRAA >60 03/02/2020 1350     Assessment/Plan: 1 Day Post-Op   Active Problems:   S/P total knee replacement, right   WBAT with walker DVT ppx: Aspirin, SCDs, TEDS ABLA: HgB 9.7 this AM. Asymptomatic at this time PO pain control PT/OT Dispo: D/C home once cleared by therapy     Dorothyann Peng 09/11/2020, 10:35 AM [facility] is now [facility] [place], [place], [place] [address] Phone: [phone] [email or web address] Facebook  [employer]

## 2020-09-11 NOTE — Progress Notes (Signed)
Physical Therapy Treatment Patient Details Name: Katie Woodard MRN: [id number] DOB: [birth year] Today's Date: 09/11/2020    History of Present Illness Patient is 55 y.o. female s/p Rt TKA on 09/10/20 with PMH significant for OA, CKD, GERD, migraines.    PT Comments    POD # 1 pm session Spouse present during session for Family education on mobility esp the stairs and HEP.   General bed mobility comments: OOB in recliner General Gait Details: with spouse "hands on" assisted with education on safe handling, walker use and ability General stair comments: with spouse "hands on" assisted up 2 steps with walker due to no rails Then returned to room to perform some TE's following HEP handout.  Instructed on proper tech, freq as well as use of ICE.   Addressed all mobility questions, discussed appropriate activity, educated on use of ICE.  Pt ready for D/C to home.   Follow Up Recommendations  Follow surgeon's recommendation for DC plan and follow-up therapies;Outpatient PT     Equipment Recommendations  Rolling walker with 5" wheels (youth)    Recommendations for Other Services       Precautions / Restrictions Precautions Precautions: Fall Precaution Comments: instructed no pillow under knee Restrictions Weight Bearing Restrictions: No Other Position/Activity Restrictions: WBAT    Mobility  Bed Mobility   General bed mobility comments: OOB in recliner    Transfers Overall transfer level: Needs assistance Equipment used: Rolling walker (2 wheeled) Transfers: Sit to/from [facility] Sit to Stand: Supervision Stand pivot transfers: Supervision       General transfer comment: 25% VC's on proper hand placement and safety with turns.  Self able with increased time.  Ambulation/Gait Ambulation/Gait assistance: Supervision Gait Distance (Feet): 26 Feet   Gait Pattern/deviations: Step-to pattern;Decreased step length - right;Decreased step length -  left Gait velocity: decreased   General Gait Details: with spouse "hands on" assisted with education on safe handling, walker use and ability   Stairs Stairs: Yes Stairs assistance: Min assist Stair Management: No rails;Step to pattern;Forwards;With walker Number of Stairs: 2 General stair comments: with spouse "hands on" assisted up 2 steps with walker due to no rails   Wheelchair Mobility    Modified Rankin (Stroke Patients Only)       Balance                                            Cognition Arousal/Alertness: Awake/alert Behavior During Therapy: WFL for tasks assessed/performed Overall Cognitive Status: Within Functional Limits for tasks assessed                                 General Comments: AxO x 3 very pleasant and motivated      Exercises   Total Knee Replacement TE's following HEP handout 10 reps B LE ankle pumps 05 reps towel squeezes 05 reps knee presses 05 reps heel slides  05 reps SAQ's 05 reps SLR's 05 reps ABD Educated on use of gait belt to assist with TE's Followed by ICE     General Comments        Pertinent Vitals/Pain Pain Assessment: 0-10 Pain Score: 7  Pain Location: Rt knee Pain Descriptors / Indicators: Discomfort;Sore;Tender Pain Intervention(s): Monitored during session;Premedicated before session;Repositioned;Ice applied    Home Living  Prior Function            PT Goals (current goals can now be found in the care plan section) Progress towards PT goals: Progressing toward goals    Frequency    7X/week      PT Plan Current plan remains appropriate    Co-evaluation              AM-PAC PT "6 Clicks" Mobility   Outcome Measure  Help needed turning from your back to your side while in a flat bed without using bedrails?: None Help needed moving from lying on your back to sitting on the side of a flat bed without using bedrails?: None Help  needed moving to and from a bed to a chair (including a wheelchair)?: A Little Help needed standing up from a chair using your arms (e.g., wheelchair or bedside chair)?: A Little Help needed to walk in hospital room?: A Little Help needed climbing 3-5 steps with a railing? : A Little 6 Click Score: 20    End of Session Equipment Utilized During Treatment: Gait belt Activity Tolerance: Patient tolerated treatment well Patient left: in chair;with call bell/phone within reach Nurse Communication: Mobility status PT Visit Diagnosis: Unsteadiness on feet (R26.81);Muscle weakness (generalized) (M62.81);Pain;Other abnormalities of gait and mobility (R26.89) Pain - Right/Left: Right Pain - part of body: Knee     Time: 1410-1450 PT Time Calculation (min) (ACUTE ONLY): 40 min  Charges:  $Gait Training: 8-22 mins $Therapeutic Exercise: 8-22 mins $Therapeutic Activity: 8-22 mins                     Rica Koyanagi  PTA Acute  Rehabilitation Services Pager      [phone] Office      [phone]

## 2020-09-11 NOTE — Progress Notes (Signed)
Physical Therapy Treatment Patient Details Name: Katie Woodard MRN: [id number] DOB: 1965/11/24 Today's Date: 09/11/2020    History of Present Illness Patient is 55 y.o. female s/p Rt TKA on 09/10/20 with PMH significant for OA, CKD, GERD, migraines.    PT Comments    POD # 1 am session General bed mobility comments: demonstarted and instructed how to self assist R LE using belt loop able with increased time General transfer comment: 25% VC's on proper hand placement and safety with turns.  Self able with increased time. General Gait Details: 25% VC's on proper upright posture and safety with turns as well as proper sequencing. Performed a few TE's but limited by pain and fatiguePt will need another PT session with spouse to address stairs and educate on mobility  Follow Up Recommendations  Follow surgeon's recommendation for DC plan and follow-up therapies;Outpatient PT     Equipment Recommendations  Rolling walker with 5" wheels (youth)    Recommendations for Other Services       Precautions / Restrictions Precautions Precautions: Fall Precaution Comments: instructed no pillow under knee Restrictions Weight Bearing Restrictions: No Other Position/Activity Restrictions: WBAT    Mobility  Bed Mobility Overal bed mobility: Needs Assistance Bed Mobility: Supine to Sit     Supine to sit: Supervision;HOB elevated     General bed mobility comments: demonstarted and instructed how to self assist R LE using belt loop able with increased time    Transfers Overall transfer level: Needs assistance Equipment used: Rolling walker (2 wheeled) Transfers: Sit to/from [facility] Sit to Stand: Supervision Stand pivot transfers: Supervision;Min guard       General transfer comment: 25% VC's on proper hand placement and safety with turns.  Self able with increased time.  Ambulation/Gait Ambulation/Gait assistance: Supervision;Min guard Gait Distance (Feet):  18 Feet   Gait Pattern/deviations: Step-to pattern;Decreased step length - right;Decreased step length - left Gait velocity: decreased   General Gait Details: 25% VC's on proper upright posture and safety with turns as well as proper sequencing.   Stairs             Wheelchair Mobility    Modified Rankin (Stroke Patients Only)       Balance                                            Cognition Arousal/Alertness: Awake/alert Behavior During Therapy: WFL for tasks assessed/performed Overall Cognitive Status: Within Functional Limits for tasks assessed                                 General Comments: AxO x 3 very pleasant and motivated      Exercises  10 reps AP, knee presses    General Comments        Pertinent Vitals/Pain Pain Assessment: 0-10 Pain Score: 7  Pain Location: Rt knee Pain Descriptors / Indicators: Discomfort;Sore;Tender Pain Intervention(s): Monitored during session;Premedicated before session;Repositioned;Ice applied    Home Living                      Prior Function            PT Goals (current goals can now be found in the care plan section) Progress towards PT goals: Progressing toward goals    Frequency  7X/week      PT Plan Current plan remains appropriate    Co-evaluation              AM-PAC PT "6 Clicks" Mobility   Outcome Measure  Help needed turning from your back to your side while in a flat bed without using bedrails?: None Help needed moving from lying on your back to sitting on the side of a flat bed without using bedrails?: None Help needed moving to and from a bed to a chair (including a wheelchair)?: A Little Help needed standing up from a chair using your arms (e.g., wheelchair or bedside chair)?: A Little Help needed to walk in hospital room?: A Little Help needed climbing 3-5 steps with a railing? : A Little 6 Click Score: 20    End of Session Equipment  Utilized During Treatment: Gait belt Activity Tolerance: Patient tolerated treatment well Patient left: in chair;with call bell/phone within reach Nurse Communication: Mobility status PT Visit Diagnosis: Unsteadiness on feet (R26.81);Muscle weakness (generalized) (M62.81);Pain;Other abnormalities of gait and mobility (R26.89) Pain - Right/Left: Right Pain - part of body: Knee     Time: 1020-1045 PT Time Calculation (min) (ACUTE ONLY): 25 min  Charges:  $Gait Training: 8-22 mins $Therapeutic Activity: 8-22 mins                     Rica Koyanagi  PTA Acute  Rehabilitation Services Pager      [phone] Office      [phone]

## 2020-09-15 ENCOUNTER — Ambulatory Visit (HOSPITAL_BASED_OUTPATIENT_CLINIC_OR_DEPARTMENT_OTHER)
Admission: RE | Admit: 2020-09-15 | Discharge: 2020-09-15 | Disposition: A | Discharge status: Home / Self Care | Payer: Self-pay | Source: Ambulatory Visit | Attending: Orthopedic Surgery | Admitting: Orthopedic Surgery

## 2020-09-15 ENCOUNTER — Other Ambulatory Visit: Payer: Self-pay

## 2020-09-15 ENCOUNTER — Other Ambulatory Visit (HOSPITAL_BASED_OUTPATIENT_CLINIC_OR_DEPARTMENT_OTHER): Payer: Self-pay | Admitting: Orthopedic Surgery

## 2020-09-15 DIAGNOSIS — R2241 Localized swelling, mass and lump, right lower limb: Secondary | ICD-10-CM

## 2021-06-19 ENCOUNTER — Encounter: Payer: Self-pay | Admitting: Emergency Medicine

## 2021-06-19 ENCOUNTER — Other Ambulatory Visit: Payer: Self-pay

## 2021-06-19 ENCOUNTER — Ambulatory Visit
Admission: EM | Admit: 2021-06-19 | Discharge: 2021-06-19 | Disposition: A | Discharge status: Other Acute Inpatient Hospital | Payer: 59 | Attending: Physician Assistant | Admitting: Physician Assistant

## 2021-06-19 ENCOUNTER — Encounter (HOSPITAL_COMMUNITY): Payer: Self-pay

## 2021-06-19 ENCOUNTER — Emergency Department (HOSPITAL_COMMUNITY): Payer: 59

## 2021-06-19 ENCOUNTER — Emergency Department (HOSPITAL_COMMUNITY)
Admission: EM | Admit: 2021-06-19 | Discharge: 2021-06-19 | Disposition: A | Discharge status: Home / Self Care | Payer: 59 | Attending: Emergency Medicine | Admitting: Emergency Medicine

## 2021-06-19 DIAGNOSIS — Z28310 Unvaccinated for covid-19: Secondary | ICD-10-CM | POA: Diagnosis not present

## 2021-06-19 DIAGNOSIS — Z79899 Other long term (current) drug therapy: Secondary | ICD-10-CM | POA: Insufficient documentation

## 2021-06-19 DIAGNOSIS — R079 Chest pain, unspecified: Secondary | ICD-10-CM | POA: Diagnosis not present

## 2021-06-19 DIAGNOSIS — R0600 Dyspnea, unspecified: Secondary | ICD-10-CM

## 2021-06-19 DIAGNOSIS — J101 Influenza due to other identified influenza virus with other respiratory manifestations: Secondary | ICD-10-CM | POA: Diagnosis not present

## 2021-06-19 DIAGNOSIS — R0682 Tachypnea, not elsewhere classified: Secondary | ICD-10-CM

## 2021-06-19 DIAGNOSIS — R000 Tachycardia, unspecified: Secondary | ICD-10-CM | POA: Diagnosis not present

## 2021-06-19 DIAGNOSIS — N189 Chronic kidney disease, unspecified: Secondary | ICD-10-CM | POA: Insufficient documentation

## 2021-06-19 DIAGNOSIS — R0602 Shortness of breath: Secondary | ICD-10-CM | POA: Diagnosis present

## 2021-06-19 DIAGNOSIS — Z20822 Contact with and (suspected) exposure to covid-19: Secondary | ICD-10-CM | POA: Insufficient documentation

## 2021-06-19 DIAGNOSIS — Z96651 Presence of right artificial knee joint: Secondary | ICD-10-CM | POA: Insufficient documentation

## 2021-06-19 LAB — CBC WITH DIFFERENTIAL/PLATELET
Abs Immature Granulocytes: 0.02 10*3/uL (ref 0.00–0.07)
Basophils Absolute: 0 10*3/uL (ref 0.0–0.1)
Basophils Relative: 0 %
Eosinophils Absolute: 0 10*3/uL (ref 0.0–0.5)
Eosinophils Relative: 0 %
HCT: 45.5 % (ref 36.0–46.0)
Hemoglobin: 13.2 g/dL (ref 12.0–15.0)
Immature Granulocytes: 0 %
Lymphocytes Relative: 23 %
Lymphs Abs: 1.5 10*3/uL (ref 0.7–4.0)
MCH: 19.6 pg — ABNORMAL LOW (ref 26.0–34.0)
MCHC: 29 g/dL — ABNORMAL LOW (ref 30.0–36.0)
MCV: 67.7 fL — ABNORMAL LOW (ref 80.0–100.0)
Monocytes Absolute: 0.5 10*3/uL (ref 0.1–1.0)
Monocytes Relative: 8 %
Neutro Abs: 4.4 10*3/uL (ref 1.7–7.7)
Neutrophils Relative %: 69 %
Platelets: ADEQUATE 10*3/uL (ref 150–400)
RBC: 6.72 MIL/uL — ABNORMAL HIGH (ref 3.87–5.11)
RDW: 20.4 % — ABNORMAL HIGH (ref 11.5–15.5)
WBC: 6.4 10*3/uL (ref 4.0–10.5)
nRBC: 0 % (ref 0.0–0.2)

## 2021-06-19 LAB — POCT INFLUENZA A/B
Influenza A, POC: NEGATIVE
Influenza B, POC: NEGATIVE

## 2021-06-19 LAB — PROTIME-INR
INR: 1 (ref 0.8–1.2)
Prothrombin Time: 13.2 seconds (ref 11.4–15.2)

## 2021-06-19 LAB — RESP PANEL BY RT-PCR (FLU A&B, COVID) ARPGX2
Influenza A by PCR: POSITIVE — AB
Influenza B by PCR: NEGATIVE
SARS Coronavirus 2 by RT PCR: NEGATIVE

## 2021-06-19 LAB — BASIC METABOLIC PANEL
Anion gap: 13 (ref 5–15)
BUN: 14 mg/dL (ref 6–20)
CO2: 19 mmol/L — ABNORMAL LOW (ref 22–32)
Calcium: 9 mg/dL (ref 8.9–10.3)
Chloride: 103 mmol/L (ref 98–111)
Creatinine, Ser: 0.54 mg/dL (ref 0.44–1.00)
GFR, Estimated: >60 mL/min (ref >60)
Glucose, Bld: 102 mg/dL — ABNORMAL HIGH (ref 70–99)
Potassium: 4.1 mmol/L (ref 3.5–5.1)
Sodium: 135 mmol/L (ref 135–145)

## 2021-06-19 LAB — BRAIN NATRIURETIC PEPTIDE: B Natriuretic Peptide: 8 pg/mL (ref 0.0–100.0)

## 2021-06-19 MED ORDER — OSELTAMIVIR PHOSPHATE 75 MG PO CAPS: 75.0000 mg | ORAL_CAPSULE | Freq: Two times a day (BID) | ORAL | 0 refills | Status: DC

## 2021-06-19 MED ORDER — ALBUTEROL SULFATE HFA 108 (90 BASE) MCG/ACT IN AERS
4.0000 | INHALATION_SPRAY | RESPIRATORY_TRACT | Status: AC
  Administered 2021-06-19 (×3): 4 via RESPIRATORY_TRACT
  Filled 2021-06-19: qty 6.7

## 2021-06-19 MED ORDER — ONDANSETRON 8 MG PO TBDP: 8.0000 mg | ORAL_TABLET | Freq: Three times a day (TID) | ORAL | 0 refills | Status: DC | PRN

## 2021-06-19 MED ORDER — MORPHINE SULFATE (PF) 4 MG/ML IV SOLN
8.0000 mg | Freq: Once | INTRAVENOUS | Status: AC
  Administered 2021-06-19: 8 mg via INTRAMUSCULAR
  Filled 2021-06-19: qty 2

## 2021-06-19 MED ORDER — MORPHINE SULFATE (PF) 4 MG/ML IV SOLN: 4.0000 mg | Freq: Once | INTRAVENOUS | Status: DC

## 2021-06-19 MED ORDER — OSELTAMIVIR PHOSPHATE 75 MG PO CAPS
75.0000 mg | ORAL_CAPSULE | Freq: Once | ORAL | Status: AC
  Administered 2021-06-19: 75 mg via ORAL
  Filled 2021-06-19: qty 1

## 2021-06-19 NOTE — ED Provider Notes (Signed)
[facility] Provider Note   CSN: [id number] Arrival date & time: 06/19/21  1038     History Chief Complaint  Patient presents with   Chest Pain   Shortness of Breath    Katie Woodard is a 55 y.o. female.  HPI     55 year old female comes in with chief complaint of chest pain and shortness of breath.  Patient has history of CKD, diverticulosis, blood transfusion requirement.  She sent to the ER from urgent care.  Patient reports 2-day history of URI-like symptoms along with right-sided chest pain, cough.  Chest pain is worse with deep inspiration.  Cough is mostly nonproductive.  She is wheezing, denies any history of asthma, COPD.  She had knee replacement done in February.  No history of PE, DVT.  3 days ago, she was able to ambulate without significant discomfort.  Patient has not received COVID-19 or influenza vaccine.  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   S/P laparoscopic-assisted sigmoidectomy 02/13/2020   Diverticulitis of sigmoid colon 01/23/2019   GERD (gastroesophageal reflux disease)    Hypokalemia    Abdominal pain 01/31/2014   Abdominal pain, chronic, right lower quadrant 01/31/2014   OBESITY 11/11/2009   ABSCESS, TOOTH 11/11/2009   DIVERTICULITIS OF COLON 02/26/2009   RECTAL BLEEDING 02/12/2009   ABDOMINAL PAIN, CHRONIC 02/12/2009   Microcytic anemia 11/13/2008   MIGRAINE HEADACHE 11/13/2008   HEMOCCULT POSITIVE STOOL 11/13/2008   MICROSCOPIC HEMATURIA 11/13/2008   VAGINITIS, BACTERIAL 11/13/2008   GERD 10/18/2008    Past Surgical  History:  Procedure Laterality Date   [place]  2005   [facility]   COLON SURGERY  01/2020   FLEXIBLE SIGMOIDOSCOPY N/A 02/13/2020   Procedure: FLEXIBLE SIGMOIDOSCOPY;  Surgeon: Ileana Roup, MD;  Location: [facility];  Service: General;  Laterality: N/A;   KNEE ARTHROPLASTY Right 09/10/2020   Procedure: COMPUTER ASSISTED TOTAL KNEE ARTHROPLASTY;  Surgeon: Rod Can, MD;  Location: [facility];  Service: Orthopedics;  Laterality: Right;  110min 3E   LAPAROSCOPIC APPENDECTOMY N/A 01/31/2014   Procedure: APPENDECTOMY LAPAROSCOPIC;  Surgeon: Gwenyth Ober, MD;  Location: [facility];  Service: General;  Laterality: N/A;     OB History     Gravida  4   Para  4   Term  4   Preterm      AB      Living         SAB      IAB      Ectopic      Multiple      Live Births              Family History  Problem Relation Age of Onset   Cancer Mother        breast   Breast cancer Mother        36s and again in her 57s   Cancer Sister  breast x2   Breast cancer Sister        early 51s and again in late 16s   Cancer Maternal Aunt        ovarian    Social History   Tobacco Use   Smoking status: Never   Smokeless tobacco: Never  Vaping Use   Vaping Use: Never used  Substance Use Topics   Alcohol use: No   Drug use: No    Home Medications Prior to Admission medications   Medication Sig Start Date End Date Taking? Authorizing Provider  acetaminophen (TYLENOL) 500 MG tablet Take 500-1,000 mg by mouth every 6 (six) hours as needed for mild pain, fever or headache.   Yes [provider]  ondansetron (ZOFRAN-ODT) 8 MG disintegrating tablet Take 1 tablet (8 mg total) by mouth every 8 (eight) hours as needed for nausea. 06/19/21  Yes Varney Biles, MD  oseltamivir (TAMIFLU) 75 MG capsule Take 1 capsule (75 mg total) by mouth every 12 (twelve) hours. 06/19/21  Yes Abdinasir Spadafore, MD  amLODipine (NORVASC) 5 MG tablet  Take 1 tablet (5 mg total) by mouth daily. Patient not taking: Reported on 06/19/2021 02/15/20   Ileana Roup, MD  cimetidine (TAGAMET) 200 MG tablet Take 200 mg by mouth daily. Patient not taking: Reported on 06/19/2021    [provider]  linaclotide Rolan Lipa) 145 MCG CAPS capsule Take 145 mcg by mouth daily before breakfast. Patient not taking: Reported on 06/19/2021    [provider]  meloxicam (MOBIC) 15 MG tablet Take 1 tablet (15 mg total) by mouth daily. Patient not taking: Reported on 06/19/2021 09/10/20   Rod Can, MD  oxyCODONE-acetaminophen (PERCOCET) 5-325 MG tablet Take 1 tablet by mouth every 4 (four) hours as needed for severe pain. Patient not taking: Reported on 06/19/2021 09/10/20 09/10/21  Rod Can, MD    Allergies    Dilaudid [hydromorphone hcl]  Review of Systems   Review of Systems  Constitutional:  Positive for activity change.  HENT:  Positive for congestion.   Respiratory:  Positive for cough, shortness of breath and wheezing.   Cardiovascular:  Positive for chest pain.  Gastrointestinal:  Negative for nausea and vomiting.  Skin:  Negative for rash.  All other systems reviewed and are negative.  Physical Exam Updated Vital Signs BP (!) 154/102 (BP Location: Right Arm)   Pulse (!) 113   Temp 99.8 F (37.7 C)   Resp (!) 30   Ht 4\' 11"  (1.499 m)   Wt 92.5 kg   SpO2 97%   BMI 41.20 kg/m   Physical Exam Vitals and nursing note reviewed.  Constitutional:      Appearance: She is well-developed.  HENT:     Head: Atraumatic.  Neck:     Vascular: No JVD.  Cardiovascular:     Rate and Rhythm: Normal rate.  Pulmonary:     Effort: Tachypnea present.     Breath sounds: Examination of the right-upper field reveals wheezing. Examination of the right-middle field reveals wheezing. Wheezing present.  Musculoskeletal:     Cervical back: Neck supple.     Right lower leg: No edema.     Left lower leg: No edema.  Skin:     General: Skin is warm and dry.  Neurological:     Mental Status: She is alert and oriented to person, place, and time.    ED Results / Procedures / Treatments   Labs (all labs ordered are listed, but only abnormal results  are displayed) Labs Reviewed  RESP PANEL BY RT-PCR (FLU A&B, COVID) ARPGX2 - Abnormal; Notable for the following components:      Result Value   Influenza A by PCR POSITIVE (*)    All other components within normal limits  BASIC METABOLIC PANEL - Abnormal; Notable for the following components:   CO2 19 (*)    Glucose, Bld 102 (*)    All other components within normal limits  CBC WITH DIFFERENTIAL/PLATELET - Abnormal; Notable for the following components:   RBC 6.72 (*)    MCV 67.7 (*)    MCH 19.6 (*)    MCHC 29.0 (*)    RDW 20.4 (*)    All other components within normal limits  BRAIN NATRIURETIC PEPTIDE  PROTIME-INR    EKG EKG Interpretation  Date/Time:  Friday June 19 2021 11:49:17 EST Ventricular Rate:  101 PR Interval:  141 QRS Duration: 76 QT Interval:  350 QTC Calculation: 416 R Axis:   -1 Text Interpretation: Sinus tachycardia Atrial premature complexes No acute changes No significant change since last tracing Confirmed by Varney Biles ([id number]) on 06/19/2021 1:17:37 PM  Radiology DG Chest Port 1 View  Result Date: 06/19/2021 CLINICAL DATA:  55 year old female with chest pain and shortness of breath for 1 week. EXAM: PORTABLE CHEST - 1 VIEW COMPARISON:  07/12/2019 FINDINGS: The mediastinal contours are within normal limits. No cardiomegaly. The lungs are clear bilaterally without evidence of focal consolidation, pleural effusion, or pneumothorax. No acute osseous abnormality. IMPRESSION: No acute cardiopulmonary process. Electronically Signed   By: Ruthann Cancer M.D.   On: 06/19/2021 11:54    Procedures Procedures   Medications Ordered in ED Medications  albuterol (VENTOLIN HFA) 108 (90 Base) MCG/ACT inhaler 4 puff (4 puffs Inhalation  Given 06/19/21 1347)  morphine 4 MG/ML injection 8 mg (8 mg Intramuscular Given 06/19/21 1348)  oseltamivir (TAMIFLU) capsule 75 mg (75 mg Oral Given 06/19/21 1427)    ED Course  I have reviewed the triage vital signs and the nursing notes.  Pertinent labs & imaging results that were available during my care of the patient were reviewed by me and considered in my medical decision making (see chart for details).  Clinical Course as of 06/19/21 1503  Fri Jun 19, 2021  1502 Influenza A By PCR(!): POSITIVE Patient is flu positive.  Reassessed.  Her respirations have come down to 20.  She looks more comfortable.  No labored respiration like it was when she first arrived.  On exam, wheezing has also subsided but still present.  Patient was ambulated in the ER.  Her O2 sats remained over 95% the entire time.  Patient is comfortable going home at this time.  She does indicate that she would prefer being on Tamiflu, which has been prescribed.  Strict ER return precautions also discussed [AN]    Clinical Course User Index [AN] Varney Biles, MD   MDM Rules/Calculators/A&P                           55 year old comes in with chief complaint of shortness of breath. She does not have any underlying lung disease or cardiac disease.  She is having 2 or 3 days of worsening shortness of breath with pleuritic chest pain.  She is also having some URI-like symptoms.  Suspicion is that most likely she has a flu or COVID-19.  She did have knee replacement in February, therefore PE is still possible along with  other etiologies like CHF.  For her wheezing, albuterol inhaler has been ordered. She is tachypneic and in mild respiratory distress, but compensating.  Might need to go on a BiPAP if her tachypnea continues  Final Clinical Impression(s) / ED Diagnoses Final diagnoses:  Influenza A    Rx / DC Orders ED Discharge Orders          Ordered    ondansetron (ZOFRAN-ODT) 8 MG disintegrating  tablet  Every 8 hours PRN        06/19/21 1441    oseltamivir (TAMIFLU) 75 MG capsule  Every 12 hours        06/19/21 1441             Varney Biles, MD 06/19/21 1503

## 2021-06-19 NOTE — ED Notes (Signed)
Patient walk down hallway with pulse ox stayed at 100% while walking.

## 2021-06-19 NOTE — Progress Notes (Signed)
On the Peak Expiratory Flow Rate patient was only able to do 60L/MIN.

## 2021-06-19 NOTE — ED Triage Notes (Addendum)
C/o fever, cough, extreme fatigue, shortness of breath x 2 days. Tried mucinex but says it has been making her breathing worse. Tachypnic in triage, RR 24, O2 96% with ambulation. Denies lung problems, diabetes, heart problems, smoking. C/o 6/10 right sided aching chest pain that worsens with breathing and coughing

## 2021-06-19 NOTE — ED Notes (Signed)
Pt verbalizes understanding of discharge instructions. Opportunity for questions and answers were provided. Pt discharged from the ED.   ?

## 2021-06-19 NOTE — ED Provider Notes (Addendum)
[facility]    CSN: [id number] Arrival date & time: 06/19/21  0853      History   Chief Complaint Chief Complaint  Patient presents with   Fever   Cough    HPI Katie Woodard is a 55 y.o. female.   Patient here today for evaluation of right sided chest pain and shortness of breath that started about 2-3 days ago. She reports she has had some fever. She has not had any history of lung disorders or cardiac history. She reports that chest pain is worsened with deep breathing, coughing.    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   S/P laparoscopic-assisted sigmoidectomy 02/13/2020   Diverticulitis of sigmoid colon 01/23/2019   GERD (gastroesophageal reflux disease)    Hypokalemia    Abdominal pain 01/31/2014   Abdominal pain, chronic, right lower quadrant 01/31/2014   OBESITY 11/11/2009   ABSCESS, TOOTH 11/11/2009   DIVERTICULITIS OF COLON 02/26/2009   RECTAL BLEEDING 02/12/2009   ABDOMINAL PAIN, CHRONIC 02/12/2009   Microcytic anemia 11/13/2008   MIGRAINE HEADACHE 11/13/2008   HEMOCCULT POSITIVE STOOL 11/13/2008   MICROSCOPIC HEMATURIA 11/13/2008   VAGINITIS, BACTERIAL 11/13/2008   GERD 10/18/2008    Past Surgical History:  Procedure Laterality Date   [place]  2005   [facility]   COLON SURGERY  01/2020   FLEXIBLE SIGMOIDOSCOPY N/A 02/13/2020   Procedure: FLEXIBLE  SIGMOIDOSCOPY;  Surgeon: Ileana Roup, MD;  Location: [facility];  Service: General;  Laterality: N/A;   KNEE ARTHROPLASTY Right 09/10/2020   Procedure: COMPUTER ASSISTED TOTAL KNEE ARTHROPLASTY;  Surgeon: Rod Can, MD;  Location: [facility];  Service: Orthopedics;  Laterality: Right;  124min 3E   LAPAROSCOPIC APPENDECTOMY N/A 01/31/2014   Procedure: APPENDECTOMY LAPAROSCOPIC;  Surgeon: Gwenyth Ober, MD;  Location: [facility];  Service: General;  Laterality: N/A;    OB History     Gravida  4   Para  4   Term  4   Preterm      AB      Living         SAB      IAB      Ectopic      Multiple      Live Births               Home Medications    Prior to Admission medications   Medication Sig Start Date End Date Taking? Authorizing Provider  amLODipine (NORVASC) 5 MG tablet Take 1 tablet (5 mg total) by mouth daily. Patient not taking: No sig reported 02/15/20   Ileana Roup, MD  cimetidine (TAGAMET) 200 MG tablet Take 200 mg by mouth daily.    [provider]  linaclotide (LINZESS) 145 MCG CAPS capsule Take 145 mcg by mouth  daily before breakfast.    [provider]  meloxicam (MOBIC) 15 MG tablet Take 1 tablet (15 mg total) by mouth daily. 09/10/20   Swinteck, Aaron Edelman, MD  ondansetron (ZOFRAN) 4 MG tablet Take 1 tablet (4 mg total) by mouth every 8 (eight) hours as needed for nausea or vomiting. 09/10/20   Swinteck, Aaron Edelman, MD  oxyCODONE-acetaminophen (PERCOCET) 5-325 MG tablet Take 1 tablet by mouth every 4 (four) hours as needed for severe pain. 09/10/20 09/10/21  Swinteck, Aaron Edelman, MD  Phenylephrine-Acetaminophen (SINUS + HEADACHE) 5-325 MG TABS Take 1 tablet by mouth daily as needed (allergies).    [provider]    Family History Family History  Problem Relation Age of Onset   Cancer Mother        breast   Breast cancer Mother        57s and again in her 65s   Cancer Sister        breast x2   Breast cancer Sister        early  40s and again in late 26s   Cancer Maternal Aunt        ovarian    Social History Social History   Tobacco Use   Smoking status: Never   Smokeless tobacco: Never  Vaping Use   Vaping Use: Never used  Substance Use Topics   Alcohol use: No   Drug use: No     Allergies   Dilaudid [hydromorphone hcl]   Review of Systems Review of Systems  Constitutional:  Positive for fever.  Eyes:  Negative for discharge and redness.  Respiratory:  Positive for cough and shortness of breath.   Cardiovascular:  Positive for chest pain.    Physical Exam Triage Vital Signs ED Triage Vitals [06/19/21 0952]  Enc Vitals Group     BP 138/87     Pulse Rate (!) 110     Resp (!) 24     Temp 98.6 F (37 C)     Temp Source Oral     SpO2 96 %     Weight      Height      Head Circumference      Peak Flow      Pain Score 6     Pain Loc      Pain Edu?      Excl. in [place]?    No data found.  Updated Vital Signs BP 138/87 (BP Location: Left Arm)   Pulse (!) 110   Temp 98.6 F (37 C) (Oral)   Resp (!) 24   SpO2 96%      Physical Exam Vitals and nursing note reviewed.  Constitutional:      General: She is in acute distress (mild tachypnea noted).     Appearance: Normal appearance.  HENT:     Head: Normocephalic and atraumatic.     Nose: Congestion present.  Eyes:     Conjunctiva/sclera: Conjunctivae normal.  Cardiovascular:     Rate and Rhythm: Regular rhythm. Tachycardia present.     Heart sounds: Normal heart sounds. No murmur heard. Pulmonary:     Effort: Respiratory distress (tachypnea noted) present.     Breath sounds: Normal breath sounds. No wheezing, rhonchi or rales.  Skin:    General: Skin is warm and dry.  Neurological:     Mental Status: She is alert.  Psychiatric:        Mood and Affect: Mood normal.        Thought  Content: Thought content normal.     UC Treatments / Results  Labs (all labs ordered are listed, but only abnormal results are displayed) Labs  Reviewed  POCT INFLUENZA A/B    EKG NSR with tachycardia noted  Radiology No results found.  Procedures Procedures (including critical care time)  Medications Ordered in UC Medications - No data to display  Initial Impression / Assessment and Plan / UC Course  I have reviewed the triage vital signs and the nursing notes.  Pertinent labs & imaging results that were available during my care of the patient were reviewed by me and considered in my medical decision making (see chart for details).    Recommended further evaluation in the ED to rule out PE, PNA, etc. Patient is agreeable to same and husband Audry Pili) will drive her POV.   Final Clinical Impressions(s) / UC Diagnoses   Final diagnoses:  Tachypnea  Right-sided chest pain  Tachycardia  Dyspnea, unspecified type   Discharge Instructions   None    ED Prescriptions   None    PDMP not reviewed this encounter.   Francene Finders, PA-C 06/19/21 1024    Francene Finders, PA-C 06/19/21 1025

## 2021-06-19 NOTE — Discharge Instructions (Addendum)
Use the inhaler every 2-4 hours for symptom relief. Start taking Tamiflu tonight.  Return to the ER if your shortness of breath gets worse.

## 2021-06-19 NOTE — ED Triage Notes (Signed)
Pt reports chest pain and sob for the past 2 days, Np cough. Tachypnea noted in triage. Sent from [facility]

## 2022-01-21 ENCOUNTER — Encounter: Payer: Self-pay | Admitting: Family Medicine

## 2022-01-21 ENCOUNTER — Ambulatory Visit (INDEPENDENT_AMBULATORY_CARE_PROVIDER_SITE_OTHER): Payer: 59 | Admitting: Family Medicine

## 2022-01-21 VITALS — BP 159/103 | HR 78 | Temp 97.6°F | Ht 59.0 in | Wt 244.0 lb

## 2022-01-21 DIAGNOSIS — F4321 Adjustment disorder with depressed mood: Secondary | ICD-10-CM

## 2022-01-21 DIAGNOSIS — R399 Unspecified symptoms and signs involving the genitourinary system: Secondary | ICD-10-CM

## 2022-01-21 DIAGNOSIS — K581 Irritable bowel syndrome with constipation: Secondary | ICD-10-CM

## 2022-01-21 DIAGNOSIS — N281 Cyst of kidney, acquired: Secondary | ICD-10-CM

## 2022-01-21 DIAGNOSIS — I10 Essential (primary) hypertension: Secondary | ICD-10-CM | POA: Diagnosis not present

## 2022-01-21 DIAGNOSIS — D259 Leiomyoma of uterus, unspecified: Secondary | ICD-10-CM | POA: Insufficient documentation

## 2022-01-21 DIAGNOSIS — K219 Gastro-esophageal reflux disease without esophagitis: Secondary | ICD-10-CM

## 2022-01-21 DIAGNOSIS — G8929 Other chronic pain: Secondary | ICD-10-CM | POA: Insufficient documentation

## 2022-01-21 DIAGNOSIS — K5792 Diverticulitis of intestine, part unspecified, without perforation or abscess without bleeding: Secondary | ICD-10-CM

## 2022-01-21 DIAGNOSIS — Z9049 Acquired absence of other specified parts of digestive tract: Secondary | ICD-10-CM

## 2022-01-21 DIAGNOSIS — Z6841 Body Mass Index (BMI) 40.0 and over, adult: Secondary | ICD-10-CM

## 2022-01-21 DIAGNOSIS — M5030 Other cervical disc degeneration, unspecified cervical region: Secondary | ICD-10-CM

## 2022-01-21 DIAGNOSIS — M25561 Pain in right knee: Secondary | ICD-10-CM

## 2022-01-21 DIAGNOSIS — Z96651 Presence of right artificial knee joint: Secondary | ICD-10-CM

## 2022-01-21 DIAGNOSIS — N189 Chronic kidney disease, unspecified: Secondary | ICD-10-CM | POA: Insufficient documentation

## 2022-01-21 DIAGNOSIS — N83209 Unspecified ovarian cyst, unspecified side: Secondary | ICD-10-CM | POA: Insufficient documentation

## 2022-01-21 HISTORY — DX: Other cervical disc degeneration, unspecified cervical region: M50.30

## 2022-01-21 LAB — POCT URINALYSIS DIPSTICK
Bilirubin, UA: NEGATIVE
Blood, UA: POSITIVE
Glucose, UA: NEGATIVE
Ketones, UA: NEGATIVE
Leukocytes, UA: NEGATIVE
Nitrite, UA: NEGATIVE
Protein, UA: POSITIVE — AB
Spec Grav, UA: 1.015 (ref 1.010–1.025)
Urobilinogen, UA: NEGATIVE E.U./dL — AB
pH, UA: 6 (ref 5.0–8.0)

## 2022-01-21 MED ORDER — CIMETIDINE 200 MG PO TABS: 200.0000 mg | ORAL_TABLET | Freq: Every day | ORAL | Status: DC

## 2022-01-21 NOTE — Assessment & Plan Note (Signed)
Recommend diet and exercise Restratification labs at next visit

## 2022-01-21 NOTE — Assessment & Plan Note (Addendum)
Benign hemorrhagic Renal function normal 05/2021

## 2022-01-21 NOTE — Assessment & Plan Note (Signed)
UA negative Urine culture

## 2022-01-21 NOTE — Assessment & Plan Note (Signed)
Other marked 1 on PHQ-9, when asked directly denies any SI or HI or plan We will refer to therapy Follow-up 1 month for reevaluation

## 2022-01-21 NOTE — Assessment & Plan Note (Signed)
Controlled with OTC Tagamet

## 2022-01-21 NOTE — Assessment & Plan Note (Addendum)
Elevated blood pressure Follow-up in 1 month for recheck Restratification labs at follow-up visit

## 2022-01-21 NOTE — Assessment & Plan Note (Signed)
Likely related to OA Controlled with OTC medications

## 2022-01-21 NOTE — Progress Notes (Signed)
Katie Woodard is a 56 y.o. female who presents today for an office visit.  Assessment/Plan:  Spent 47 minutes reviewing patient's chart and discussing past medical history and treatments as well as follow-ups Problem List Items Addressed This Visit       Cardiovascular and Mediastinum   Primary hypertension - Primary    Elevated blood pressure Follow-up in 1 month for recheck Restratification labs at follow-up visit        Digestive   GERD (gastroesophageal reflux disease)    Controlled with OTC Tagamet      Relevant Medications   cimetidine (TAGAMET) 200 MG tablet   Irritable bowel syndrome with constipation    Uncontrolled Follows with GI Request patient to update medications      Relevant Medications   cimetidine (TAGAMET) 200 MG tablet     Genitourinary   Renal cyst, left    Benign hemorrhagic Renal function normal 05/2021        Other   OBESITY    Recommend diet and exercise Restratification labs at next visit      S/P laparoscopic-assisted sigmoidectomy   S/P total knee replacement, right   Diverticulitis   UTI symptoms    UA negative Urine culture      Relevant Orders   POCT Urinalysis Dipstick (Completed)   Chronic pain of right knee    Likely related to OA Controlled with OTC medications      Grief    Other marked 1 on PHQ-9, when asked directly denies any SI or HI or plan We will refer to therapy Follow-up 1 month for reevaluation          Subjective:  HPI:  Katie Woodard is a 56 y.o. female who has OBESITY; GERD (gastroesophageal reflux disease); S/P laparoscopic-assisted sigmoidectomy; S/P total knee replacement, right; Diverticulitis; Primary hypertension; Irritable bowel syndrome with constipation; UTI symptoms; Chronic pain of right knee; Grief; and Renal cyst, left on their problem list..   She  has a past medical history of Abdominal pain, chronic, right lower quadrant (01/31/2014), Chronic kidney disease, DDD  (degenerative disc disease), cervical, DDD (degenerative disc disease), cervical (01/21/2022), Diverticulitis, DIVERTICULITIS OF COLON (02/26/2009), GERD (gastroesophageal reflux disease), Headache, History of blood transfusion, Hypokalemia, Microcytic anemia (11/13/2008), MIGRAINE HEADACHE (11/13/2008), Ovarian cyst, and Uterine fibroid..   She presents with chief complaint of Establish Care (Burning while urinating and has been treating with azo. She states that she has been getting puss filled bumps on face and all over body. Right knee pain. Alternative for Linzess due to Friday insurance not covering) .   Patient is here establish care.   Patient recently lost son her earlier this month.there is an unknown cause of death.  She said that he did have significant mental health.  She is very sad, but denies any SI/HI.  Denies any plan for suicide.  Patient reports having Acne, occassionally on face.  Patient has had Burining in Urine over the past 2 months, it is intermittent. Endorses mild diarhea.  She denies any N/V, blood stool, fever chills, hematuria.  Patient has a history of IBS-C. Dr. Benson Norway is her Thane Edu was previously taking Linzess, but could not afford it now and was switched to alternative, but doesn't know name.  It does not help as well as the ones listed.  She has had intermittent abdominal pain and constipation.  Patient has a history of  diverticulitis.she is s/p sigmoid resection, in 2021.   Patient has chronic right knee  pain.she is s/p right knee replacement in 2022.  She says that pain is improved, but still has pain, but it is controlled with OTC tylenol and diclofenac gel.   Patient has h/o benign left hemorrhagic cyst.she reports that she did follow up with Nephrology.  But that was told it was benign and did not need any more follow up.  This was seen on MRI in 2021  Denies H/o hypertension, but elevated blood pressure today.  Has been on amlodipine in the past.  She denies  headache, chest pain, shortness of breath, lower extremity swelling  Patient went to emergency department on 05/2021, did test positive for flu, CBC without leukocytosis, BMP showed mildly elevated blood sugar, with normal creatinine, BNP unremarkable.  Patient does not have any ongoing respiratory symptoms at this time.          Objective:  Physical Exam: BP (!) 159/103 (BP Location: Left Arm, Patient Position: Sitting, Cuff Size: Large)   Pulse 78   Temp 97.6 F (36.4 C) (Temporal)   Ht '4\' 11"'$  (1.499 m)   Wt 244 lb (110.7 kg)   SpO2 98%   BMI 49.28 kg/m    General: No acute distress. Awake and conversant.  Eyes: Normal conjunctiva, anicteric. Round symmetric pupils.  ENT: Hearing grossly intact. No nasal discharge.  Neck: Neck is supple. No masses or thyromegaly.  Respiratory: Respirations are non-labored. No auditory wheezing.  Skin: Warm. No rashes or ulcers.  Psych: Alert and oriented. Cooperative, initially tearful when discussing son, but does through the visit, Normal judgment.  CV: No cyanosis or JVD MSK: Right knee mildly tender to palpation, ambulates with mild limp no clubbing  Neuro: Sensation and CN II-XII grossly normal.        Alesia Banda, MD, MS

## 2022-01-21 NOTE — Assessment & Plan Note (Signed)
Uncontrolled Follows with GI Request patient to update medications

## 2022-01-21 NOTE — Patient Instructions (Signed)
It was very nice to see you today!  For increased, we are referring to behavioral health. Please follow-up in 4 weeks to check on your grief and to discuss blood pressure management.  PLEASE NOTE:  If you had any lab tests please let us know if you have not heard back within a few days. You may see your results on mychart before we have a chance to review them but we will give you a call once they are reviewed by [place]. If we ordered any referrals today, please let us know if you have not heard from their office within the next week.   Please try these tips to maintain a healthy lifestyle:  Eat at least 3 REAL meals and 1-2 snacks per day.  Aim for no more than 5 hours between eating.  If you eat breakfast, please do so within one hour of getting up.   Each meal should contain half fruits/vegetables, one quarter protein, and one quarter carbs (no bigger than a computer mouse)  Cut down on sweet beverages. This includes juice, soda, and sweet tea.   Drink at least 1 glass of water with each meal and aim for at least 8 glasses per day  Exercise at least 150 minutes every week.

## 2022-02-18 ENCOUNTER — Ambulatory Visit (INDEPENDENT_AMBULATORY_CARE_PROVIDER_SITE_OTHER): Payer: 59 | Admitting: Family Medicine

## 2022-02-18 ENCOUNTER — Encounter: Payer: Self-pay | Admitting: Family Medicine

## 2022-02-18 VITALS — BP 142/88 | HR 78 | Temp 97.8°F | Wt 222.4 lb

## 2022-02-18 DIAGNOSIS — I10 Essential (primary) hypertension: Secondary | ICD-10-CM

## 2022-02-18 DIAGNOSIS — R45851 Suicidal ideations: Secondary | ICD-10-CM

## 2022-02-18 DIAGNOSIS — F339 Major depressive disorder, recurrent, unspecified: Secondary | ICD-10-CM

## 2022-02-18 NOTE — Assessment & Plan Note (Signed)
Based on this assessment:    I provided these resources: - National suicide hotline (901)435-7065) - [place] (1-800-SUICIDE)  Reminded patient our clinic has doctors available for phone contact 24/7  Referral placed to behavioral health

## 2022-02-18 NOTE — Patient Instructions (Addendum)
It was very nice to see you today! You are doing great! Keep working on healthy living and exercise! We are referring you [facility]  When you're going through tough times, it's easy to feel lonely and overwhelmed. Remember that YOU ARE NOT ALONE and we at [facility] want to support you during this difficult time.  There are many ways to get help and support when you are ready.  You can call the following help lines: - National suicide hotline 256-659-7350) - [place] (1-800-SUICIDE)  There are many treatments that can help people during difficult times, and we can talk with you about medications, counseling, diet, and other options. We want to offer you HOPE that it won't always feel this bad.  If you are seriously thinking about hurting yourself or have a plan, please call 911 or go to any emergency room right away for immediate help.    PLEASE NOTE:  If you had any lab tests please let us know if you have not heard back within a few days. You may see your results on mychart before we have a chance to review them but we will give you a call once they are reviewed by [place]. If we ordered any referrals today, please let us know if you have not heard from their office within the next week.   Please try these tips to maintain a healthy lifestyle:  Eat at least 3 REAL meals and 1-2 snacks per day.  Aim for no more than 5 hours between eating.  If you eat breakfast, please do so within one hour of getting up.   Each meal should contain half fruits/vegetables, one quarter protein, and one quarter carbs (no bigger than a computer mouse)  Cut down on sweet beverages. This includes juice, soda, and sweet tea.   Drink at least 1 glass of water with each meal and aim for at least 8 glasses per day  Exercise at least 150 minutes every week.

## 2022-02-18 NOTE — Assessment & Plan Note (Signed)
Improved with lifestyle modifications Recommend continuation of this Recheck blood pressure in 3 months We will repeat restratification labs at that point

## 2022-02-18 NOTE — Progress Notes (Signed)
Assessment/Plan:   Problem List Items Addressed This Visit       Cardiovascular and Mediastinum   Primary hypertension    Improved with lifestyle modifications Recommend continuation of this Recheck blood pressure in 3 months We will repeat restratification labs at that point        Other   Depression, recurrent ([place]) - Primary    Patient reports that about the same Is interested in counseling      Relevant Orders   Ambulatory referral to [facility]   Passive suicidal ideations    Based on this assessment:    I provided these resources: - National suicide hotline (775)246-7155) - [place] (1-800-SUICIDE)  Reminded patient our clinic has doctors available for phone contact 24/7  Referral placed to behavioral health         Relevant Orders   Ambulatory referral to [facility]:  HPI:  Katie Woodard is a 56 y.o. female who has OBESITY; GERD (gastroesophageal reflux disease); S/P laparoscopic-assisted sigmoidectomy; S/P total knee replacement, right; Diverticulitis; Primary hypertension; Irritable bowel syndrome with constipation; UTI symptoms; Chronic pain of right knee; Grief; Renal cyst, left; Depression, recurrent ([place]); and Passive suicidal ideations on their problem list..   She  has a past medical history of Abdominal pain, chronic, right lower quadrant (01/31/2014), Chronic kidney disease, DDD (degenerative disc disease), cervical, DDD (degenerative disc disease), cervical (01/21/2022), Diverticulitis, DIVERTICULITIS OF COLON (02/26/2009), GERD (gastroesophageal reflux disease), Headache, History of blood transfusion, Hypokalemia, Microcytic anemia (11/13/2008), MIGRAINE HEADACHE (11/13/2008), Ovarian cyst, and Uterine fibroid..   She presents with chief complaint of Follow-up (4 week follow up on b/p and mood) .  Hypertension, established problem, improved BP Readings from Last 3 Encounters:  02/18/22 (!)  142/88  01/21/22 (!) 159/103  06/19/21 (!) 154/102   Home BP monitoring: None Current Medications: None , compliant without side effects. Interim History: lifestyle modifications including diet and exercise, intentional weight loss of 20 pounds  ROS: Denies any chest pain, shortness of breath, dyspnea on exertion, leg edema.   Depression/Anxiety, established problem, unstable Current Medications: None Side Effects: None Current Symptoms/Interim History: Still has grief over loss of son, they have not been able to forward retrieving his remains from the funeral home  Suicide Assessment  Plan: - How specific is the plan: Patient has done - How lethal are the means: None - Does the patient have access to the means: Reports none - Does the patient have social support: Yes through church  Protective factors (what has kept the patient from self-harm thus far): Exercise, supportive church environment  Substance use / abuse: Denies drugs and alcohol  Presence of hallucinations / delusions: None  History of SI / Attempts: Denies  Family history of attempted or completed suicide: Family history of mental illness  Duration and Intensity of SI: Worse with recent/grief   History of prior psychiatric hospitalizations: Unknown   Coping mechanisms: Exercise and religious involvement  How likely are you to act on these thoughts of hurting yourself or ending your life sometime over the next month? Patient reports NOT LIKELY AT ALL       02/18/2022    9:40 AM  Depression screen PHQ 2/9  Decreased Interest 3  Down, Depressed, Hopeless 2  PHQ - 2 Score 5  Altered sleeping 2  Tired, decreased energy 1  Change in appetite 3  Feeling bad or failure about yourself  3  Trouble concentrating 1  Moving slowly or fidgety/restless 1  Suicidal thoughts 2  PHQ-9 Score 18  Difficult doing work/chores Very difficult       02/18/2022    9:40 AM  GAD 7 : Generalized Anxiety Score   Nervous, Anxious, on Edge 2  Control/stop worrying 3  Worry too much - different things 2  Trouble relaxing 3  Restless 2  Easily annoyed or irritable 3  Afraid - awful might happen 3  Total GAD 7 Score 18  Anxiety Difficulty Very difficult    ROS: No SI or HI.    Past Surgical History:  Procedure Laterality Date   APPENDECTOMY     CESAREAN SECTION  1988   CHOLECYSTECTOMY  2005   [facility]   COLON SURGERY  01/2020   FLEXIBLE SIGMOIDOSCOPY N/A 02/13/2020   Procedure: FLEXIBLE SIGMOIDOSCOPY;  Surgeon: Ileana Roup, MD;  Location: [facility];  Service: General;  Laterality: N/A;   KNEE ARTHROPLASTY Right 09/10/2020   Procedure: COMPUTER ASSISTED TOTAL KNEE ARTHROPLASTY;  Surgeon: Rod Can, MD;  Location: [facility];  Service: Orthopedics;  Laterality: Right;  127mn 3E   LAPAROSCOPIC APPENDECTOMY N/A 01/31/2014   Procedure: APPENDECTOMY LAPAROSCOPIC;  Surgeon: JGwenyth Ober MD;  Location: [facility]  Service: General;  Laterality: N/A;    Outpatient Medications Prior to Visit  Medication Sig Dispense Refill   acetaminophen (TYLENOL) 500 MG tablet Take 500-1,000 mg by mouth every 6 (six) hours as needed for mild pain, fever or headache.     cimetidine (TAGAMET) 200 MG tablet Take 1 tablet (200 mg total) by mouth daily.     No facility-administered medications prior to visit.    Family History  Problem Relation Age of Onset   Cancer Mother        breast   Breast cancer Mother        561sand again in her 675s  Cancer Sister        breast x2   Breast cancer Sister        early 580sand again in late 575s  Cancer Maternal Aunt        ovarian    Social History   Socioeconomic History   Marital status: Married    Spouse name: Not on file   Number of children: Not on file   Years of education: Not on file   Highest education level: Not on file  Occupational History   Not on file  Tobacco Use   Smoking status: Never   Smokeless tobacco: Never  Vaping Use   Vaping Use:  Never used  Substance and Sexual Activity   Alcohol use: No   Drug use: No   Sexual activity: Not on file  Other Topics Concern   Not on file  Social History Narrative   Not on file   Social Determinants of Health   Financial Resource Strain: Not on file  Food Insecurity: Not on file  Transportation Needs: Not on file  Physical Activity: Not on file  Stress: Not on file  Social Connections: Not on file  Intimate Partner Violence: Not on file  Objective:  Physical Exam: BP (!) 142/88 (BP Location: Left Arm, Patient Position: Sitting, Cuff Size: Large)   Pulse 78   Temp 97.8 F (36.6 C) (Temporal)   Wt 222 lb 6.4 oz (100.9 kg)   SpO2 98%   BMI 44.92 kg/m    General: No acute distress. Awake and conversant.  Eyes: Normal conjunctiva, anicteric. Round symmetric pupils.  ENT: Hearing grossly intact. No nasal discharge.  Neck: Neck is supple. No masses or thyromegaly.  Respiratory: Respirations are non-labored. No auditory wheezing.  Skin: Warm. No rashes or ulcers.  Psych: Alert and oriented. Cooperative, depressed affect, tearful normal judgment.  CV: No cyanosis or JVD MSK: Normal ambulation. No clubbing  Neuro: Sensation and CN II-XII grossly normal.        Alesia Banda, MD, MS

## 2022-02-18 NOTE — Assessment & Plan Note (Signed)
Patient reports that about the same Is interested in counseling

## 2022-05-21 ENCOUNTER — Ambulatory Visit: Payer: Self-pay | Admitting: Family Medicine

## 2022-06-09 ENCOUNTER — Other Ambulatory Visit: Payer: Self-pay | Admitting: Family Medicine

## 2022-06-09 ENCOUNTER — Other Ambulatory Visit (HOSPITAL_BASED_OUTPATIENT_CLINIC_OR_DEPARTMENT_OTHER): Payer: Self-pay | Admitting: Nurse Practitioner

## 2022-06-09 DIAGNOSIS — K219 Gastro-esophageal reflux disease without esophagitis: Secondary | ICD-10-CM

## 2022-06-09 DIAGNOSIS — Z1231 Encounter for screening mammogram for malignant neoplasm of breast: Secondary | ICD-10-CM

## 2022-06-10 MED ORDER — CIMETIDINE 200 MG PO TABS: 200.0000 mg | ORAL_TABLET | Freq: Every day | ORAL | Status: DC

## 2022-06-23 ENCOUNTER — Ambulatory Visit (HOSPITAL_BASED_OUTPATIENT_CLINIC_OR_DEPARTMENT_OTHER)
Admission: RE | Admit: 2022-06-23 | Discharge: 2022-06-23 | Disposition: A | Discharge status: Home / Self Care | Payer: No Typology Code available for payment source | Source: Ambulatory Visit | Attending: Nurse Practitioner | Admitting: Nurse Practitioner

## 2022-06-23 DIAGNOSIS — Z1231 Encounter for screening mammogram for malignant neoplasm of breast: Secondary | ICD-10-CM | POA: Insufficient documentation

## 2022-07-09 ENCOUNTER — Other Ambulatory Visit: Payer: Self-pay | Admitting: Internal Medicine

## 2022-07-09 DIAGNOSIS — Z96651 Presence of right artificial knee joint: Secondary | ICD-10-CM

## 2022-08-18 ENCOUNTER — Ambulatory Visit
Admission: RE | Admit: 2022-08-18 | Discharge: 2022-08-18 | Disposition: A | Discharge status: Home / Self Care | Payer: Disability Insurance | Source: Ambulatory Visit | Attending: Internal Medicine | Admitting: Internal Medicine

## 2022-08-18 ENCOUNTER — Ambulatory Visit
Admission: RE | Admit: 2022-08-18 | Discharge: 2022-08-18 | Disposition: A | Discharge status: Home / Self Care | Payer: Disability Insurance | Attending: Internal Medicine | Admitting: Internal Medicine

## 2022-08-18 ENCOUNTER — Other Ambulatory Visit: Payer: Self-pay | Admitting: Internal Medicine

## 2022-08-18 DIAGNOSIS — Z96651 Presence of right artificial knee joint: Secondary | ICD-10-CM | POA: Insufficient documentation

## 2022-09-25 ENCOUNTER — Ambulatory Visit (INDEPENDENT_AMBULATORY_CARE_PROVIDER_SITE_OTHER): Payer: 59

## 2022-09-25 ENCOUNTER — Ambulatory Visit (HOSPITAL_COMMUNITY)
Admission: EM | Admit: 2022-09-25 | Discharge: 2022-09-25 | Disposition: A | Discharge status: Home / Self Care | Payer: 59 | Attending: Emergency Medicine | Admitting: Emergency Medicine

## 2022-09-25 ENCOUNTER — Encounter (HOSPITAL_COMMUNITY): Payer: Self-pay | Admitting: Emergency Medicine

## 2022-09-25 DIAGNOSIS — R079 Chest pain, unspecified: Secondary | ICD-10-CM | POA: Insufficient documentation

## 2022-09-25 DIAGNOSIS — J069 Acute upper respiratory infection, unspecified: Secondary | ICD-10-CM | POA: Insufficient documentation

## 2022-09-25 DIAGNOSIS — Z1152 Encounter for screening for COVID-19: Secondary | ICD-10-CM | POA: Insufficient documentation

## 2022-09-25 DIAGNOSIS — I10 Essential (primary) hypertension: Secondary | ICD-10-CM | POA: Insufficient documentation

## 2022-09-25 DIAGNOSIS — R059 Cough, unspecified: Secondary | ICD-10-CM | POA: Diagnosis not present

## 2022-09-25 HISTORY — DX: Type 2 diabetes mellitus without complications: E11.9

## 2022-09-25 HISTORY — DX: Essential (primary) hypertension: I10

## 2022-09-25 MED ORDER — IBUPROFEN 800 MG PO TABS: 800.0000 mg | ORAL_TABLET | Freq: Three times a day (TID) | ORAL | 0 refills | Status: DC

## 2022-09-25 MED ORDER — BENZONATATE 200 MG PO CAPS: 200.0000 mg | ORAL_CAPSULE | Freq: Three times a day (TID) | ORAL | 0 refills | Status: AC | PRN

## 2022-09-25 MED ORDER — ALBUTEROL SULFATE HFA 108 (90 BASE) MCG/ACT IN AERS: 2.0000 | INHALATION_SPRAY | RESPIRATORY_TRACT | 0 refills | Status: DC | PRN

## 2022-09-25 NOTE — ED Provider Notes (Addendum)
[facility]    CSN: [id number] Arrival date & time: 09/25/22  1359      History   Chief Complaint Chief Complaint  Patient presents with   Headache   Generalized Body Aches    HPI Katie Woodard is a 57 y.o. female.   Patient presents to clinic with husband and is complaining of headaches and generalized body aches, reports pain 10/10, 'all over.' Endorses shortness of breath, non-productive cough and central chest pain, 8/10 currently. Has not taken anything for pain. Reports her blood pressure is normally elevated, but not usually this high. Takes valsartan for this, has taken today. Denies N/V/D.   On exam she is laying down on the exam table, requiring assistance with sitting up from husband and staff.   Husband is assisting to provide some history.     The history is provided by the patient and the spouse.  Headache Associated symptoms: cough, myalgias and sore throat   Associated symptoms: no abdominal pain, no back pain, no ear pain, no eye pain, no fever, no neck pain, no seizures and no vomiting     Past Medical History:  Diagnosis Date   Abdominal pain, chronic, right lower quadrant 01/31/2014   Chronic kidney disease    mases in kidney   DDD (degenerative disc disease), cervical    DDD (degenerative disc disease), cervical 01/21/2022   Diabetes mellitus without complication (HCC)    Diverticulitis    DIVERTICULITIS OF COLON 02/26/2009   Qualifier: Diagnosis of  By: Daphine Deutscher FNP, Nykedtra     GERD (gastroesophageal reflux disease)    Takes OTC meds   Headache    migraines twice a week   History of blood transfusion    Hypertension    Hypokalemia    Microcytic anemia 11/13/2008   Qualifier: Diagnosis of  By: Daphine Deutscher FNP, Nykedtra     MIGRAINE HEADACHE 11/13/2008   Qualifier: Diagnosis of  By: Daphine Deutscher FNP, Nykedtra     Ovarian cyst    Uterine fibroid     Patient Active Problem List   Diagnosis Date Noted   Depression, recurrent (HCC)  02/18/2022   Passive suicidal ideations 02/18/2022   Diverticulitis 01/21/2022   Primary hypertension 01/21/2022   Irritable bowel syndrome with constipation 01/21/2022   UTI symptoms 01/21/2022   Chronic pain of right knee 01/21/2022   Grief 01/21/2022   Renal cyst, left 01/21/2022   S/P total knee replacement, 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   S/P laparoscopic-assisted sigmoidectomy 02/13/2020   GERD (gastroesophageal reflux disease)    OBESITY 11/11/2009    Past Surgical History:  Procedure Laterality Date   APPENDECTOMY     CESAREAN SECTION  1988   CHOLECYSTECTOMY  2005   [facility]   COLON SURGERY  01/2020   FLEXIBLE SIGMOIDOSCOPY N/A 02/13/2020   Procedure: FLEXIBLE SIGMOIDOSCOPY;  Surgeon: Andria Meuse, MD;  Location: [facility];  Service: General;  Laterality: N/A;   KNEE ARTHROPLASTY Right 09/10/2020   Procedure: COMPUTER ASSISTED TOTAL KNEE ARTHROPLASTY;  Surgeon: Samson Frederic, MD;  Location: [facility];  Service: Orthopedics;  Laterality: Right;  3E   LAPAROSCOPIC APPENDECTOMY N/A 01/31/2014   Procedure: APPENDECTOMY LAPAROSCOPIC;  Surgeon: Cherylynn Ridges, MD;  Location: [facility];  Service: General;  Laterality: N/A;    OB History     Gravida  4   Para  4   Term  4   Preterm      AB      Living  SAB      IAB      Ectopic      Multiple      Live Births               Home Medications    Prior to Admission medications   Medication Sig Start Date End Date Taking? Authorizing Provider  albuterol (VENTOLIN HFA) 108 (90 Base) MCG/ACT inhaler Inhale 2 puffs into the lungs every 4 (four) hours as needed for wheezing or shortness of breath. 09/25/22 10/25/22 Yes Rinaldo Ratel, [place] N, FNP  benzonatate (TESSALON) 200 MG capsule Take 1 capsule (200 mg total) by mouth 3 (three) times daily as needed for up to 7 days for cough. 09/25/22 10/02/22 Yes Rinaldo Ratel, [place] N, FNP  ibuprofen (ADVIL) 800 MG tablet Take 1 tablet (800 mg total) by mouth 3 (three) times daily. 09/25/22   Yes Rinaldo Ratel, [place] N, FNP  acetaminophen (TYLENOL) 500 MG tablet Take 500-1,000 mg by mouth every 6 (six) hours as needed for mild pain, fever or headache.    [provider]  cimetidine (TAGAMET) 200 MG tablet Take 1 tablet (200 mg total) by mouth daily. 06/10/22   Garnette Gunner, MD    Family History Family History  Problem Relation Age of Onset   Cancer Mother        breast   Breast cancer Mother        6s and again in her 81s   Cancer Sister        breast x2   Breast cancer Sister        early 58s and again in late 61s   Cancer Maternal Aunt        ovarian    Social History Social History   Tobacco Use   Smoking status: Never   Smokeless tobacco: Never  Vaping Use   Vaping Use: Never used  Substance Use Topics   Alcohol use: No   Drug use: No     Allergies   Dilaudid [hydromorphone hcl] and Amlodipine   Review of Systems Review of Systems  Constitutional:  Positive for chills. Negative for fever.  HENT:  Positive for sore throat. Negative for ear pain.   Eyes:  Negative for pain and visual disturbance.  Respiratory:  Positive for cough and shortness of breath. Negative for wheezing.   Cardiovascular:  Positive for chest pain. Negative for palpitations.  Gastrointestinal:  Negative for abdominal pain and vomiting.  Genitourinary:  Negative for dysuria and hematuria.  Musculoskeletal:  Positive for arthralgias and myalgias. Negative for back pain and neck pain.  Skin:  Negative for color change and rash.  Neurological:  Positive for headaches. Negative for seizures and syncope.  All other systems reviewed and are negative.    Physical Exam Triage Vital Signs ED Triage Vitals  Enc Vitals Group     BP 09/25/22 1457 (!) 160/122     Pulse Rate 09/25/22 1457 91     Resp 09/25/22 1457 (!) 22     Temp 09/25/22 1457 99.4 F (37.4 C)     Temp Source 09/25/22 1457 Oral     SpO2 09/25/22 1457 99 %     Weight --      Height --      Head  Circumference --      Peak Flow --      Pain Score 09/25/22 1453 10     Pain Loc --      Pain Edu? --  Excl. in GC? --    No data found.  Updated Vital Signs BP (!) 170/116 (BP Location: Left Arm)   Pulse 91   Temp 99.4 F (37.4 C) (Oral)   Resp (!) 22   SpO2 99%   Visual Acuity Right Eye Distance:   Left Eye Distance:   Bilateral Distance:    Right Eye Near:   Left Eye Near:    Bilateral Near:     Physical Exam Vitals and nursing note reviewed.  Constitutional:      General: She is not in acute distress.    Appearance: Normal appearance. She is well-developed.  HENT:     Head: Normocephalic and atraumatic.     Right Ear: External ear normal.     Left Ear: External ear normal.     Nose: Nose normal.     Mouth/Throat:     Mouth: Mucous membranes are moist.     Pharynx: Posterior oropharyngeal erythema present.  Eyes:     General: Lids are normal.     Extraocular Movements: Extraocular movements intact.     Conjunctiva/sclera: Conjunctivae normal.     Pupils: Pupils are equal, round, and reactive to light.  Cardiovascular:     Rate and Rhythm: Normal rate and regular rhythm.     Pulses: Normal pulses.     Heart sounds: Normal heart sounds, S1 normal and S2 normal. No murmur heard. Pulmonary:     Effort: Pulmonary effort is normal. No respiratory distress.     Breath sounds: Normal breath sounds.     Comments: Lungs vesicular posteriorly. Musculoskeletal:        General: No swelling. Normal range of motion.     Cervical back: Normal range of motion and neck supple.  Skin:    General: Skin is warm and dry.     Capillary Refill: Capillary refill takes less than 2 seconds.  Neurological:     Mental Status: She is alert.     GCS: GCS eye subscore is 4. GCS verbal subscore is 5. GCS motor subscore is 6.  Psychiatric:        Mood and Affect: Mood normal.        Behavior: Behavior is cooperative.      UC Treatments / Results  Labs (all labs ordered are  listed, but only abnormal results are displayed) Labs Reviewed  SARS CORONAVIRUS 2 (TAT 6-24 HRS)    EKG   Radiology DG Chest 2 View  Result Date: 09/25/2022 CLINICAL DATA:  Headache and body aches EXAM: CHEST - 2 VIEW COMPARISON:  June 19, 2021 FINDINGS: The heart size and mediastinal contours are within normal limits. Both lungs are clear. The visualized skeletal structures are unremarkable. IMPRESSION: No active cardiopulmonary disease. Electronically Signed   By: Gerome Sam III M.D.   On: 09/25/2022 15:26    Procedures Procedures (including critical care time)  Medications Ordered in UC Medications - No data to display  Initial Impression / Assessment and Plan / UC Course  I have reviewed the triage vital signs and the nursing notes.  Pertinent labs & imaging results that were available during my care of the patient were reviewed by me and considered in my medical decision making (see chart for details).  Vitals and triage reviewed, patient appears hemodynamically stable. She is hypertensive, reports compliance with valsartan. Without vision changes or worst headache of her life. Oxygenation 99% on room air, subjective shortness of breath, chest x-ray without active cardiopulmonary disease or infection.  I agree with this interpretation. Endorses central chest pain 8 /10, EKG my interpretation normal sinus rhythm at 90 bpm, PR interval of 156, QRS 76 ms, QT 386. Without STE or STD. Normal EKG. Will swab for COVID-19, will call if results are positive as she is a candidate for antiviral therapy.  Suspect symptoms are largely due to a viral upper respiratory infection.  Symptomatic treatment discussed.  Given strict emergency department precautions, patient and partner verbalized understanding.    Final Clinical Impressions(s) / UC Diagnoses   Final diagnoses:  Viral upper respiratory tract infection with cough     Discharge Instructions      Overall your symptoms are  consistent with a viral upper respiratory infection.  Please use the albuterol inhaler as needed for shortness of breath.  You can take the [facility] as needed for cough.  Please alternate between 800 mg ibuprofen and Tylenol, as needed for aches and pains.  Your chest x-ray was without signs of infection, your EKG was without signs of a heart attack.  Please do not hesitate to seek emergency care if you develop worsening of shortness of breath, worsening of chest pain, or worsening of blood pressure.     ED Prescriptions     Medication Sig Dispense Auth. Provider   albuterol (VENTOLIN HFA) 108 (90 Base) MCG/ACT inhaler Inhale 2 puffs into the lungs every 4 (four) hours as needed for wheezing or shortness of breath. 18 g Rinaldo Ratel, [place] N, [place]   ibuprofen (ADVIL) 800 MG tablet Take 1 tablet (800 mg total) by mouth 3 (three) times daily. 21 tablet Rinaldo Ratel, [place] N, [place]   benzonatate (TESSALON) 200 MG capsule Take 1 capsule (200 mg total) by mouth 3 (three) times daily as needed for up to 7 days for cough. 20 capsule Terrelle Ruffolo, [place] N, [place]      I have reviewed the PDMP during this encounter.   Shalyn Koral, [place] N, [place] 09/25/22 1720    Sadira Standard, [place] N, FNP 09/25/22 1722    Malea Swilling, [place] N, [place] 09/26/22 1017

## 2022-09-25 NOTE — Discharge Instructions (Addendum)
Overall your symptoms are consistent with a viral upper respiratory infection.  Please use the albuterol inhaler as needed for shortness of breath.  You can take the [facility] as needed for cough.  Please alternate between 800 mg ibuprofen and Tylenol, as needed for aches and pains.  Your chest x-ray was without signs of infection, your EKG was without signs of a heart attack.  Please do not hesitate to seek emergency care if you develop worsening of shortness of breath, worsening of chest pain, or worsening of blood pressure.

## 2022-09-25 NOTE — ED Triage Notes (Signed)
For two days patient c/o headache and body aches. Had cold medications and tylenol.

## 2022-09-26 LAB — SARS CORONAVIRUS 2 (TAT 6-24 HRS): SARS Coronavirus 2: NEGATIVE

## 2022-10-04 ENCOUNTER — Emergency Department (HOSPITAL_COMMUNITY)
Admission: EM | Admit: 2022-10-04 | Discharge: 2022-10-04 | Disposition: A | Discharge status: Home / Self Care | Payer: 59 | Attending: Emergency Medicine | Admitting: Emergency Medicine

## 2022-10-04 ENCOUNTER — Emergency Department (HOSPITAL_COMMUNITY): Payer: 59

## 2022-10-04 ENCOUNTER — Encounter (HOSPITAL_COMMUNITY): Payer: Self-pay | Admitting: Emergency Medicine

## 2022-10-04 ENCOUNTER — Other Ambulatory Visit: Payer: Self-pay

## 2022-10-04 DIAGNOSIS — G43109 Migraine with aura, not intractable, without status migrainosus: Secondary | ICD-10-CM

## 2022-10-04 DIAGNOSIS — N189 Chronic kidney disease, unspecified: Secondary | ICD-10-CM | POA: Insufficient documentation

## 2022-10-04 DIAGNOSIS — I639 Cerebral infarction, unspecified: Secondary | ICD-10-CM | POA: Diagnosis not present

## 2022-10-04 DIAGNOSIS — E1122 Type 2 diabetes mellitus with diabetic chronic kidney disease: Secondary | ICD-10-CM | POA: Insufficient documentation

## 2022-10-04 DIAGNOSIS — I160 Hypertensive urgency: Secondary | ICD-10-CM | POA: Diagnosis not present

## 2022-10-04 DIAGNOSIS — R0602 Shortness of breath: Secondary | ICD-10-CM | POA: Diagnosis not present

## 2022-10-04 DIAGNOSIS — G43909 Migraine, unspecified, not intractable, without status migrainosus: Secondary | ICD-10-CM | POA: Diagnosis not present

## 2022-10-04 DIAGNOSIS — I10 Essential (primary) hypertension: Secondary | ICD-10-CM | POA: Diagnosis not present

## 2022-10-04 DIAGNOSIS — Z96651 Presence of right artificial knee joint: Secondary | ICD-10-CM | POA: Diagnosis not present

## 2022-10-04 DIAGNOSIS — R29818 Other symptoms and signs involving the nervous system: Secondary | ICD-10-CM | POA: Diagnosis not present

## 2022-10-04 DIAGNOSIS — F1290 Cannabis use, unspecified, uncomplicated: Secondary | ICD-10-CM | POA: Diagnosis not present

## 2022-10-04 DIAGNOSIS — R531 Weakness: Secondary | ICD-10-CM | POA: Insufficient documentation

## 2022-10-04 DIAGNOSIS — I6523 Occlusion and stenosis of bilateral carotid arteries: Secondary | ICD-10-CM | POA: Diagnosis not present

## 2022-10-04 DIAGNOSIS — I129 Hypertensive chronic kidney disease with stage 1 through stage 4 chronic kidney disease, or unspecified chronic kidney disease: Secondary | ICD-10-CM | POA: Diagnosis not present

## 2022-10-04 DIAGNOSIS — R0600 Dyspnea, unspecified: Secondary | ICD-10-CM | POA: Diagnosis not present

## 2022-10-04 DIAGNOSIS — R519 Headache, unspecified: Secondary | ICD-10-CM | POA: Diagnosis not present

## 2022-10-04 LAB — URINALYSIS, ROUTINE W REFLEX MICROSCOPIC
Bacteria, UA: NONE SEEN
Bilirubin Urine: NEGATIVE
Glucose, UA: NEGATIVE mg/dL
Ketones, ur: 5 mg/dL — AB
Leukocytes,Ua: NEGATIVE
Nitrite: NEGATIVE
Protein, ur: NEGATIVE mg/dL
Specific Gravity, Urine: 1.031 — ABNORMAL HIGH (ref 1.005–1.030)
pH: 7 (ref 5.0–8.0)

## 2022-10-04 LAB — RAPID URINE DRUG SCREEN, HOSP PERFORMED
Amphetamines: NOT DETECTED
Barbiturates: NOT DETECTED
Benzodiazepines: NOT DETECTED
Cocaine: NOT DETECTED
Opiates: NOT DETECTED
Tetrahydrocannabinol: POSITIVE — AB

## 2022-10-04 LAB — CBC WITH DIFFERENTIAL/PLATELET
Abs Immature Granulocytes: 0.03 10*3/uL (ref 0.00–0.07)
Basophils Absolute: 0 10*3/uL (ref 0.0–0.1)
Basophils Relative: 0 %
Eosinophils Absolute: 0.1 10*3/uL (ref 0.0–0.5)
Eosinophils Relative: 1 %
HCT: 39.5 % (ref 36.0–46.0)
Hemoglobin: 11.8 g/dL — ABNORMAL LOW (ref 12.0–15.0)
Immature Granulocytes: 0 %
Lymphocytes Relative: 19 %
Lymphs Abs: 1.9 10*3/uL (ref 0.7–4.0)
MCH: 20.2 pg — ABNORMAL LOW (ref 26.0–34.0)
MCHC: 29.9 g/dL — ABNORMAL LOW (ref 30.0–36.0)
MCV: 67.5 fL — ABNORMAL LOW (ref 80.0–100.0)
Monocytes Absolute: 0.5 10*3/uL (ref 0.1–1.0)
Monocytes Relative: 5 %
Neutro Abs: 7.9 10*3/uL — ABNORMAL HIGH (ref 1.7–7.7)
Neutrophils Relative %: 75 %
Platelets: 358 10*3/uL (ref 150–400)
RBC: 5.85 MIL/uL — ABNORMAL HIGH (ref 3.87–5.11)
RDW: 19 % — ABNORMAL HIGH (ref 11.5–15.5)
WBC: 10.5 10*3/uL (ref 4.0–10.5)
nRBC: 0 % (ref 0.0–0.2)

## 2022-10-04 LAB — COMPREHENSIVE METABOLIC PANEL
ALT: 13 U/L (ref 0–44)
AST: 18 U/L (ref 15–41)
Albumin: 3.6 g/dL (ref 3.5–5.0)
Alkaline Phosphatase: 62 U/L (ref 38–126)
Anion gap: 16 — ABNORMAL HIGH (ref 5–15)
BUN: 7 mg/dL (ref 6–20)
CO2: 19 mmol/L — ABNORMAL LOW (ref 22–32)
Calcium: 9.4 mg/dL (ref 8.9–10.3)
Chloride: 102 mmol/L (ref 98–111)
Creatinine, Ser: 0.49 mg/dL (ref 0.44–1.00)
GFR, Estimated: >60 mL/min (ref >60)
Glucose, Bld: 112 mg/dL — ABNORMAL HIGH (ref 70–99)
Potassium: 3.8 mmol/L (ref 3.5–5.1)
Sodium: 137 mmol/L (ref 135–145)
Total Bilirubin: 0.1 mg/dL — ABNORMAL LOW (ref 0.3–1.2)
Total Protein: 6.9 g/dL (ref 6.5–8.1)

## 2022-10-04 LAB — TROPONIN I (HIGH SENSITIVITY)
Troponin I (High Sensitivity): 9 ng/L (ref <18)
Troponin I (High Sensitivity): 10 ng/L (ref <18)

## 2022-10-04 LAB — I-STAT CHEM 8, ED
BUN: 7 mg/dL (ref 6–20)
Calcium, Ion: 1.02 mmol/L — ABNORMAL LOW (ref 1.15–1.40)
Chloride: 102 mmol/L (ref 98–111)
Creatinine, Ser: 0.3 mg/dL — ABNORMAL LOW (ref 0.44–1.00)
Glucose, Bld: 118 mg/dL — ABNORMAL HIGH (ref 70–99)
HCT: 40 % (ref 36.0–46.0)
Hemoglobin: 13.6 g/dL (ref 12.0–15.0)
Potassium: 3.7 mmol/L (ref 3.5–5.1)
Sodium: 137 mmol/L (ref 135–145)
TCO2: 28 mmol/L (ref 22–32)

## 2022-10-04 LAB — SEDIMENTATION RATE: Sed Rate: 18 mm/hr (ref 0–22)

## 2022-10-04 LAB — I-STAT BETA HCG BLOOD, ED (MC, WL, AP ONLY): I-stat hCG, quantitative: <5 m[IU]/mL (ref <5)

## 2022-10-04 LAB — C-REACTIVE PROTEIN: CRP: 1 mg/dL — ABNORMAL HIGH (ref <1.0)

## 2022-10-04 LAB — ETHANOL: Alcohol, Ethyl (B): <10 mg/dL (ref <10)

## 2022-10-04 MED ORDER — TETRACAINE HCL 0.5 % OP SOLN
2.0000 [drp] | Freq: Once | OPHTHALMIC | Status: AC
  Administered 2022-10-04: 2 [drp] via OPHTHALMIC
  Filled 2022-10-04: qty 4

## 2022-10-04 MED ORDER — FENTANYL CITRATE PF 50 MCG/ML IJ SOSY
50.0000 ug | PREFILLED_SYRINGE | Freq: Once | INTRAMUSCULAR | Status: AC
  Administered 2022-10-04: 50 ug via INTRAVENOUS
  Filled 2022-10-04 (×2): qty 1

## 2022-10-04 MED ORDER — METOCLOPRAMIDE HCL 5 MG/ML IJ SOLN: 10.0000 mg | Freq: Once | INTRAMUSCULAR | Status: DC

## 2022-10-04 MED ORDER — KETOROLAC TROMETHAMINE 15 MG/ML IJ SOLN: 15.0000 mg | Freq: Once | INTRAMUSCULAR | Status: DC

## 2022-10-04 MED ORDER — IOHEXOL 350 MG/ML SOLN
50.0000 mL | Freq: Once | INTRAVENOUS | Status: AC | PRN
  Administered 2022-10-04: 50 mL via INTRAVENOUS

## 2022-10-04 MED ORDER — KETOROLAC TROMETHAMINE 15 MG/ML IJ SOLN
15.0000 mg | Freq: Once | INTRAMUSCULAR | Status: AC
  Administered 2022-10-04: 15 mg via INTRAVENOUS
  Filled 2022-10-04: qty 1

## 2022-10-04 MED ORDER — METOCLOPRAMIDE HCL 5 MG/ML IJ SOLN
10.0000 mg | Freq: Once | INTRAMUSCULAR | Status: AC
  Administered 2022-10-04: 10 mg via INTRAVENOUS
  Filled 2022-10-04: qty 2

## 2022-10-04 MED ORDER — IOHEXOL 350 MG/ML SOLN
75.0000 mL | Freq: Once | INTRAVENOUS | Status: AC | PRN
  Administered 2022-10-04: 75 mL via INTRAVENOUS

## 2022-10-04 MED ORDER — DIPHENHYDRAMINE HCL 50 MG/ML IJ SOLN: 25.0000 mg | Freq: Once | INTRAMUSCULAR | Status: DC

## 2022-10-04 MED ORDER — FLUORESCEIN SODIUM 1 MG OP STRP
1.0000 | ORAL_STRIP | Freq: Once | OPHTHALMIC | Status: AC
  Administered 2022-10-04: 1 via OPHTHALMIC
  Filled 2022-10-04: qty 1

## 2022-10-04 MED ORDER — DIPHENHYDRAMINE HCL 50 MG/ML IJ SOLN
25.0000 mg | Freq: Once | INTRAMUSCULAR | Status: AC
  Administered 2022-10-04: 25 mg via INTRAVENOUS
  Filled 2022-10-04: qty 1

## 2022-10-04 NOTE — ED Provider Triage Note (Signed)
Emergency Medicine Provider Triage Evaluation Note  Katie Woodard , a 57 y.o. female  was evaluated in triage.  Pt complains of blood sugar and BP high x 2 days, headache today with trouble seeing out of right eye (blurry onset last night at 5pm when she was getting ready to lay down to rest bc head was hurting, hurts and feels swollen. Also feeling dizzy- described as pre syncope. Also shob. No lower ext swelling. No history of similar symptoms previously.  New diagnosis HTN and DM in December, on meds.  Review of Systems  Positive:  Negative:   Physical Exam  BP (!) 178/112 (BP Location: Right Arm)   Pulse 89   Temp 98.6 F (37 C) (Oral)   Resp 18   Ht '4\' 11"'$  (1.499 m)   Wt 99.8 kg   SpO2 100%   BMI 44.43 kg/m  Gen:   Awake, tearful Resp:  Normal effort  MSK:   Moves extremities without difficulty  Other:  Generally weak, no arm drift, no sensory defficit  Medical Decision Making  Medically screening exam initiated at 1:27 PM.  Appropriate orders placed.  ZEIDY STILLION was informed that the remainder of the evaluation will be completed by another provider, this initial triage assessment does not replace that evaluation, and the importance of remaining in the ED until their evaluation is complete.     Tacy Learn, PA-C 10/04/22 1332

## 2022-10-04 NOTE — Consult Note (Signed)
NEUROLOGY CONSULTATION NOTE   Date of service: October 04, 2022 Patient Name: Katie Woodard MRN:  [id number] DOB:  1965/11/28 Reason for consult: "R hemianopsia, code stroke" Requesting Provider: Sherwood Gambler, MD _ _ _   _ __   _ __ _ _  __ __   _ __   __ _  History of Present Illness  Katie Woodard is a 57 y.o. female with PMH significant for migraine headaches, chronic kidney disease, cervical degenerative disc disease, diabetes, hypertension, abdominal pain, history of blood transfusion, ovarian cyst and uterine fibroids who presents to the ED complaining of right-sided headache and decreased right vision.  Neuro was consulted and upon hearing of the symptoms, neurology advised a code stroke to be called.  We met patient in the ED on assessment she exhibits right hemianopsia, mild R sided weakness, decreased sensation to right arm and leg, slowed cerebellar testing including rapid alternative movements on right side. BP was 190/104  Patient was taken emergently to CT, where a CT angio/Perfusion was completed and was negative. CT head was previously done at 1435 today which showed no acute process. MRI pending.  Patient visibly in pain, endorses a 10 out of 10 headache on the right temple area. Fentanyl was given for headache.  Patient states she went to bed last night around 830 with no issues.  Upon waking this morning around 7/730 she had a headache and decreased right-sided vision.  LKW: 20303/10 mRS: 0 tNKASE: No, outside of window Thrombectomy: Np, no LVO  NIHSS components Score: Comment  1a Level of Conscious 0'[x]'$  1'[]'$  2'[]'$  3'[]'$      1b LOC Questions 0'[x]'$  1'[]'$  2'[]'$       1c LOC Commands 0'[x]'$  1'[]'$  2'[]'$       2 Best Gaze 0'[x]'$  1'[]'$  2'[]'$       3 Visual 0'[]'$  1'[]'$  2'[x]'$  3'[]'$     R hemianopsia  4 Facial Palsy 0'[x]'$  1'[]'$  2'[]'$  3'[]'$      5a Motor Arm - left 0'[x]'$  1'[]'$  2'[]'$  3'[]'$  4'[]'$  UN'[]'$    5b Motor Arm - Right 0'[]'$  1'[x]'$  2'[]'$  3'[]'$  4'[]'$  UN'[]'$    6a Motor Leg - Left 0'[x]'$  1'[]'$  2'[]'$  3'[]'$  4'[]'$  UN'[]'$    6b Motor Leg - Right 0'[]'$   1'[]'$  2'[x]'$  3'[]'$  4'[]'$  UN'[]'$    7 Limb Ataxia 0'[x]'$  1'[]'$  2'[]'$  3'[]'$  UN'[]'$     8 Sensory 0'[]'$  1'[x]'$  2'[]'$  UN'[]'$     Decreased on R side  9 Best Language 0'[x]'$  1'[]'$  2'[]'$  3'[]'$      10 Dysarthria 0'[x]'$  1'[]'$  2'[]'$  UN'[]'$      11 Extinct. and Inattention 0'[x]'$  1'[]'$  2'[]'$       TOTAL:    6      ROS   Constitutional Denies weight loss, fever and chills.   HEENT Denies changes in vision and hearing.   Respiratory Denies SOB and cough.   CV Denies palpitations and CP   GI Denies abdominal pain, nausea, vomiting and diarrhea.   GU Denies dysuria and urinary frequency.   MSK Denies myalgia and joint pain.   Skin Denies rash and pruritus.   Neurological Denies syncope. +for 10/10 headache R temple area.   Psychiatric Denies recent changes in mood. Denies anxiety and depression.    Past History   Past Medical History:  Diagnosis Date   Abdominal pain, chronic, right lower quadrant 01/31/2014   Chronic kidney disease    mases in kidney   DDD (degenerative disc disease), cervical    DDD (degenerative disc disease), cervical 01/21/2022   Diabetes mellitus without  complication ([place])    Diverticulitis    DIVERTICULITIS OF COLON 02/26/2009   Qualifier: Diagnosis of  By: Hassell Done FNP, Nykedtra     GERD (gastroesophageal reflux disease)    Takes OTC meds   Headache    migraines twice a week   History of blood transfusion    Hypertension    Hypokalemia    Microcytic anemia 11/13/2008   Qualifier: Diagnosis of  By: Hassell Done FNP, Nykedtra     MIGRAINE HEADACHE 11/13/2008   Qualifier: Diagnosis of  By: Hassell Done FNP, Nykedtra     Ovarian cyst    Uterine fibroid    Past Surgical History:  Procedure Laterality Date   [place]  2005   [facility]   COLON SURGERY  01/2020   FLEXIBLE SIGMOIDOSCOPY N/A 02/13/2020   Procedure: FLEXIBLE SIGMOIDOSCOPY;  Surgeon: Ileana Roup, MD;  Location: [facility];  Service: General;  Laterality: N/A;   KNEE ARTHROPLASTY Right 09/10/2020   Procedure:  COMPUTER ASSISTED TOTAL KNEE ARTHROPLASTY;  Surgeon: Rod Can, MD;  Location: [facility];  Service: Orthopedics;  Laterality: Right;  166mn 3E   LAPAROSCOPIC APPENDECTOMY N/A 01/31/2014   Procedure: APPENDECTOMY LAPAROSCOPIC;  Surgeon: JGwenyth Ober MD;  [facility]: [facility]  Service: General;  Laterality: N/A;   Family History  Problem Relation Age of Onset   Cancer Mother        breast   Breast cancer Mother        52sand again in her 654s  Cancer Sister        breast x2   Breast cancer Sister        early 537sand again in late 538s  Cancer Maternal Aunt        ovarian   Social History   Socioeconomic History   Marital status: Married    Spouse name: Not on file   Number of children: Not on file   Years of education: Not on file   Highest education level: Not on file  Occupational History   Not on file  Tobacco Use   Smoking status: Never   Smokeless tobacco: Never  Vaping Use   Vaping Use: Never used  Substance and Sexual Activity   Alcohol use: No   Drug use: No   Sexual activity: Not on file  Other Topics Concern   Not on file  Social History Narrative   Not on file   Social Determinants of Health   Financial Resource Strain: Not on file  Food Insecurity: Not on file  Transportation Needs: Not on file  Physical Activity: Not on file  Stress: Not on file  Social Connections: Not on file   Allergies  Allergen Reactions   Dilaudid [Hydromorphone Hcl] Nausea And Vomiting and Other (See Comments)    "Head spinning sensation"    Amlodipine Nausea Only    Medications  (Not in a hospital admission)    Vitals   Vitals:   10/04/22 1322 10/04/22 1323 10/04/22 1640 10/04/22 1700  BP:  (!) 178/112 (!) 188/109 (!) 194/98  Pulse: 89  86 77  Resp: '18  19 17  '$ Temp: 98.6 F (37 C)     TempSrc: Oral     SpO2: 100%  100% 100%  Weight:  99.8 kg    Height:  '4\' 11"'$  (1.499 m)       Body mass index is 44.43 kg/m.  Physical Exam  General: Laying  comfortably in bed; in obvious pain  HENT: Normal oropharynx and mucosa. Normal external appearance of ears and nose.  Neck: Supple, no pain or tenderness  CV: No JVD. No peripheral edema.  Pulmonary: Symmetric Chest rise. Slightly labored breathing.  Abdomen: Soft to touch, non-tender. Ext: No cyanosis, edema, or deformity  Skin: No rash. Normal palpation of skin.   Musculoskeletal: Normal digits and nails by inspection.   Neurologic Examination  Mental status/Cognition: Alert, oriented to self, place, month and year, good attention.  Speech/language: Fluent, comprehension intact, object naming intact, repetition intact.  Cranial nerves:   CN II Pupils equal and reactive to light. R hemianopsia.    CN III,IV,VI EOM intact, no gaze preference or deviation, no nystagmus    CN V normal sensation in V1, V2, and V3 segments bilaterally    CN VII no asymmetry, no nasolabial fold flattening    CN VIII normal hearing to speech    CN IX & X normal palatal elevation, no uvular deviation    CN XI 5/5 head turn and 5/5 shoulder shrug bilaterally    CN XII midline tongue protrusion    Motor:  RUE: 4-/5 with drift. 4/5 grip,  RLE: 3/5 with drift, 3/5 plantar/dorsal flexion  LUE: 4+/5, no drift, 4+/5 grip LLE: 4/5, no drift, 4/5 plantar/dorsal flexion  Normal bulk and tone. No tremor.   Sensation:  Light touch Mildly decreased to touch in RUE.   Pin prick    Temperature    Vibration   Proprioception    Coordination/Complex Motor:  - Finger to Nose intact BL - Heel to shin intact BL - Rapid alternating movement are slowed on the right. - Gait: deferred for patient safety.  Labs   CBC:  Recent Labs  Lab 10/04/22 1335 10/04/22 1350  WBC 10.5  --   NEUTROABS 7.9*  --   HGB 11.8* 13.6  HCT 39.5 40.0  MCV 67.5*  --   PLT 358  --     Basic Metabolic Panel:  Lab Results  Component Value Date   NA 137 10/04/2022   K 3.7 10/04/2022   CO2 19 (L) 10/04/2022   GLUCOSE 118 (H)  10/04/2022   BUN 7 10/04/2022   CREATININE 0.30 (L) 10/04/2022   CALCIUM 9.4 10/04/2022   GFRNONAA >60 10/04/2022   GFRAA >60 03/02/2020   Lipid Panel:  Lab Results  Component Value Date   LDLCALC 94 11/11/2009   HgbA1c:  Lab Results  Component Value Date   HGBA1C 5.9 (H) 02/05/2020   Urine Drug Screen: No results found for: "LABOPIA", "COCAINSCRNUR", "LABBENZ", "AMPHETMU", "THCU", "LABBARB"  Alcohol Level     Component Value Date/Time   ETH <10 10/04/2022 1638    CT Head without contrast(Personally reviewed): CTH was negative for a large hypodensity concerning for a large territory infarct or hyperdensity concerning for an ICH  CT angio Head and Neck with contrast(Personally reviewed): No LVO  CT Perfusion(Personally reviewed): No mismatch  MRI Brain(Personally reviewed): Pending  Impression   MADELYNE TAYLOE is a 57 y.o. female with PMH significant for migraine headaches, chronic kidney disease, cervical degenerative disc disease, diabetes, hypertension, abdominal pain, history of blood transfusion, ovarian cyst and uterine fibroids who woke up this AM with R frontotemporal headache, R hemianopsia, mild R arm numbness and mild RUE and RLE weakness.  Presentation is concerning for left PCA stroke. Other differential includes hemiplegic migraines, PRES. However, no prior hx of focal deficit with her headaches,  no prior hx of PRES and would expect PRES to cause BL vision deficit. Unlikely for temporal arteritis to cause hemianopasia, would expect more of a mono-ocular vision deficit.  Recommendations  Plan: Recommend that primary team order following: - Frequent Neuro checks per stroke unit protocol - Recommend brain imaging with MRI Brain without contrast - Recommend obtaining TTE - Recommend obtaining Lipid panel with LDL - Please start statin if LDL > 70 - Recommend HbA1c to evaluate for diabetes and how well it is controlled. - Antithrombotic - aspirin '81mg'$   daily with  - Recommend DVT ppx - SBP goal - permissive hypertension first 24 h < 220/110. Held home meds.  - Recommend Telemetry monitoring for arrythmia - Recommend bedside swallow screen prior to PO intake. - Stroke education booklet - Recommend PT/OT/SLP consult   ______________________________________________________________________   Thank you for the opportunity to take part in the care of this patient. If you have any further questions, please contact the neurology consultation attending.  Signed,  [place] Pager Number [phone] _ _ _   _ __   _ __ _ _  __ __   _ __   __ _

## 2022-10-04 NOTE — ED Provider Notes (Signed)
[facility] Provider Note   CSN: [id number] Arrival date & time: 10/04/22  1240  An emergency department physician performed an initial assessment on this suspected stroke patient at 1725 (EDP activated).  History  Chief Complaint  Patient presents with   Hypertension   Headache   Blurred Vision    Katie Woodard is a 57 y.o. female.  HPI 57 year old female presents with a chief complaint of headache.  Headache started yesterday though it was a lot worse when she woke up this morning.  She went to bed last night around 8 PM and states she had no other symptoms.  However since awakening she is having right eye pain, vision changes in the right side of her vision, and right-sided weakness.  The weakness and numbness is in her right arm and leg.  No fevers, cough, vomiting.  She feels like the light hurts the right eye.  She has been compliant with her blood pressure meds but over the last 2 days her blood pressures been running around 180 when normally runs around 150.  She has a history of type 2 diabetes as well.  Home Medications Prior to Admission medications   Medication Sig Start Date End Date Taking? Authorizing Provider  acetaminophen (TYLENOL) 500 MG tablet Take 500-1,000 mg by mouth every 6 (six) hours as needed for mild pain, fever or headache.    [provider]  albuterol (VENTOLIN HFA) 108 (90 Base) MCG/ACT inhaler Inhale 2 puffs into the lungs every 4 (four) hours as needed for wheezing or shortness of breath. 09/25/22 10/25/22  Garrison, [place] N, FNP  cimetidine (TAGAMET) 200 MG tablet Take 1 tablet (200 mg total) by mouth daily. 06/10/22   Bonnita Hollow, MD  ibuprofen (ADVIL) 800 MG tablet Take 1 tablet (800 mg total) by mouth 3 (three) times daily. 09/25/22   Garrison, [place] N, FNP      Allergies    Dilaudid [hydromorphone hcl] and Amlodipine    Review of Systems   Review of Systems  Constitutional:   Negative for fever.  Eyes:  Positive for pain and visual disturbance.  Respiratory:  Positive for shortness of breath (for 2 days).   Cardiovascular:  Negative for chest pain.  Gastrointestinal:  Negative for vomiting.  Neurological:  Positive for weakness, numbness and headaches.    Physical Exam Updated Vital Signs BP (!) 162/101   Pulse 79   Temp 98.9 F (37.2 C) (Oral)   Resp 17   Ht '4\' 11"'$  (1.499 m)   Wt 99.8 kg   SpO2 98%   BMI 44.43 kg/m  Physical Exam Vitals and nursing note reviewed.  Constitutional:      Appearance: She is well-developed. She is obese. She is not diaphoretic.  HENT:     Head: Normocephalic and atraumatic.   Eyes:     Extraocular Movements: Extraocular movements intact.     Pupils: Pupils are equal, round, and reactive to light.     Comments: Right eye has photophobia (only when shined in right eye)  Cardiovascular:     Rate and Rhythm: Normal rate and regular rhythm.     Heart sounds: Normal heart sounds.  Pulmonary:     Effort: Pulmonary effort is normal.     Breath sounds: Normal breath sounds.  Abdominal:     Palpations: Abdomen is soft.     Tenderness: There is no abdominal tenderness.  Musculoskeletal:     Cervical back: No  rigidity.  Skin:    General: Skin is warm and dry.  Neurological:     Mental Status: She is alert.     Comments: CN 3-12 grossly intact. 5/5 strength in both left upper and lower extremities. Right upper extremity has some mild weakness, and right leg is hard to lift off bed. Feels tingling in right leg on light touch.      ED Results / Procedures / Treatments   Labs (all labs ordered are listed, but only abnormal results are displayed) Labs Reviewed  CBC WITH DIFFERENTIAL/PLATELET - Abnormal; Notable for the following components:      Result Value   RBC 5.85 (*)    Hemoglobin 11.8 (*)    MCV 67.5 (*)    MCH 20.2 (*)    MCHC 29.9 (*)    RDW 19.0 (*)    Neutro Abs 7.9 (*)    All other components within  normal limits  COMPREHENSIVE METABOLIC PANEL - Abnormal; Notable for the following components:   CO2 19 (*)    Glucose, Bld 112 (*)    Total Bilirubin 0.1 (*)    Anion gap 16 (*)    All other components within normal limits  RAPID URINE DRUG SCREEN, HOSP PERFORMED - Abnormal; Notable for the following components:   Tetrahydrocannabinol POSITIVE (*)    All other components within normal limits  URINALYSIS, ROUTINE W REFLEX MICROSCOPIC - Abnormal; Notable for the following components:   Color, Urine STRAW (*)    Specific Gravity, Urine 1.031 (*)    Hgb urine dipstick SMALL (*)    Ketones, ur 5 (*)    All other components within normal limits  C-REACTIVE PROTEIN - Abnormal; Notable for the following components:   CRP 1.0 (*)    All other components within normal limits  I-STAT CHEM 8, ED - Abnormal; Notable for the following components:   Creatinine, Ser 0.30 (*)    Glucose, Bld 118 (*)    Calcium, Ion 1.02 (*)    All other components within normal limits  ETHANOL  SEDIMENTATION RATE  PROTIME-INR  APTT  I-STAT BETA HCG BLOOD, ED ([facility], [facility], [facility] ONLY)  TROPONIN I (HIGH SENSITIVITY)  TROPONIN I (HIGH SENSITIVITY)    EKG EKG Interpretation  Date/Time:  Monday October 04 2022 16:34:23 EDT Ventricular Rate:  82 PR Interval:  169 QRS Duration: 88 QT Interval:  408 QTC Calculation: 477 R Axis:   -26 Text Interpretation: Sinus rhythm  nonspecific T waves similar to Sep 25 2022 Confirmed by Sherwood Gambler [phone]) on 10/04/2022 4:37:41 PM  Radiology MR BRAIN WO CONTRAST  Result Date: 10/04/2022 CLINICAL DATA:  Right-sided hemianopsia, possible right-sided weakness, stroke suspected EXAM: MRI HEAD WITHOUT CONTRAST TECHNIQUE: Multiplanar, multiecho pulse sequences of the brain and surrounding structures were obtained without intravenous contrast. COMPARISON:  None Available. FINDINGS: Brain: No restricted diffusion to suggest acute or subacute infarct. No acute hemorrhage, mass, mass  effect, or midline shift. No hydrocephalus or extra-axial collection. Normal pituitary and craniocervical junction. No hemosiderin deposition to suggest remote hemorrhage. Cerebral volume is normal for age. Scattered T2 hyperintense signal in the periventricular white matter, likely the sequela of mild chronic small vessel ischemic disease. Vascular: Normal arterial flow voids. Skull and upper cervical spine: Normal marrow signal. Sinuses/Orbits: Clear paranasal sinuses. No acute finding in the orbits. Other: Fluid in left mastoid air cells. IMPRESSION: No acute intracranial process. No evidence of acute or subacute infarct. Electronically Signed   By: Francetta Found.D.  On: 10/04/2022 20:59   CT ANGIO HEAD NECK W WO CM (CODE STROKE)  Result Date: 10/04/2022 CLINICAL DATA:  Provided history: Right-sided hemianopsia. Possible right-sided weakness. EXAM: CT ANGIOGRAPHY HEAD AND NECK CT PERFUSION BRAIN TECHNIQUE: Multidetector CT imaging of the head and neck was performed using the standard protocol during bolus administration of intravenous contrast. Multiplanar CT image reconstructions and MIPs were obtained to evaluate the vascular anatomy. Carotid stenosis measurements (when applicable) are obtained utilizing NASCET criteria, using the distal internal carotid diameter as the denominator. Multiphase CT imaging of the brain was performed following IV bolus contrast injection. Subsequent parametric perfusion maps were calculated using RAPID software. RADIATION DOSE REDUCTION: This exam was performed according to the departmental dose-optimization program which includes automated exposure control, adjustment of the mA and/or kV according to patient size and/or use of iterative reconstruction technique. CONTRAST:  18m OMNIPAQUE IOHEXOL 350 MG/ML SOLN COMPARISON:  Noncontrast head CT performed earlier today 10/04/2022. FINDINGS: CTA NECK FINDINGS Aortic arch: Common origin of the innominate and left common carotid  arteries. No hemodynamically significant innominate or proximal subclavian artery stenosis. Right carotid system: CCA and ICA patent within the neck without stenosis or significant atherosclerotic disease. Left carotid system: CCA and ICA patent within the neck no stenosis. Mild atherosclerotic plaque about the carotid bifurcation. Tortuosity and partially retropharyngeal course of the cervical ICA. Vertebral arteries: Patent within the neck without stenosis or significant atherosclerotic disease. Skeleton: Cervical spondylosis. No acute fracture or aggressive osseous lesion. Other neck: Multiple thyroid nodules measuring up to 13 mm, not meeting consensus criteria for ultrasound follow-up based on size. No follow-up imaging recommended. Reference: J Am Coll Radiol. 2015 Feb;12(2): 143-50. 8 mm nodule within the right parotid gland (series 7, image 146). Upper chest: 6 mm pulmonary nodule within the right upper lobe (series 7, image 294). Review of the MIP images confirms the above findings CTA HEAD FINDINGS Anterior circulation: The intracranial internal carotid arteries are patent. Atherosclerotic plaque within both vessels with no more than mild stenosis. The M1 middle cerebral arteries are patent. No M2 proximal branch occlusion or high-grade proximal stenosis. The anterior cerebral arteries are patent. No intracranial aneurysm is identified. Posterior circulation: The intracranial vertebral arteries are patent. The basilar artery is patent. The posterior cerebral arteries are patent. Posterior communicating arteries are present, bilaterally. Venous sinuses: Within the limitations of contrast timing, no convincing thrombus. Anatomic variants: None significant. Review of the MIP images confirms the above findings CT Brain Perfusion Findings: CBF (<30%) Volume: 035mPerfusion (Tmax>6.0s) volume: 60m32mismatch Volume: 60mL51mfarction Location:None identified. No emergent large vessel occlusion identified. This result,  and the CT perfusion head results, were called by telephone at the time of interpretation on 10/04/2022 at 5:53 pm to provider [facility] verbally acknowledged these results. IMPRESSION: CTA neck: 1. The common carotid, internal carotid and vertebral arteries are patent within the neck without stenosis. Mild atherosclerotic plaque about the left carotid bifurcation. 2. 6 mm pulmonary nodule within the right upper lobe. Non-contrast chest CT at 6-12 months is recommended. If the nodule is stable at time of repeat CT, then future CT at 18-24 months (from today's scan) is considered optional for low-risk patients, but is recommended for high-risk patients. This recommendation follows the consensus statement: Guidelines for Management of Incidental Pulmonary Nodules Detected on CT Images: From the Fleischner Society 2017; Radiology 2017; 284:228-243. 3. 8 mm nodule within the right parotid gland, which may reflect an enlarged intraparotid lymph node or primary parotid  neoplasm. Nonemergent ENT follow-up recommended. CTA head: 1. No intracranial large vessel occlusion or proximal high-grade arterial stenosis identified. 2. Atherosclerotic plaque within the intracranial internal carotid arteries with no more than mild stenosis. CT perfusion head: The perfusion software identifies no core infarct. The perfusion software identifies no critically hypoperfused parenchyma (utilizing the Tmax>6 seconds threshold). No mismatch volume reported. Electronically Signed   By: Kellie Simmering D.O.   On: 10/04/2022 18:15   CT CEREBRAL PERFUSION W CONTRAST  Result Date: 10/04/2022 CLINICAL DATA:  Provided history: Right-sided hemianopsia. Possible right-sided weakness. EXAM: CT ANGIOGRAPHY HEAD AND NECK CT PERFUSION BRAIN TECHNIQUE: Multidetector CT imaging of the head and neck was performed using the standard protocol during bolus administration of intravenous contrast. Multiplanar CT image reconstructions and MIPs were  obtained to evaluate the vascular anatomy. Carotid stenosis measurements (when applicable) are obtained utilizing NASCET criteria, using the distal internal carotid diameter as the denominator. Multiphase CT imaging of the brain was performed following IV bolus contrast injection. Subsequent parametric perfusion maps were calculated using RAPID software. RADIATION DOSE REDUCTION: This exam was performed according to the departmental dose-optimization program which includes automated exposure control, adjustment of the mA and/or kV according to patient size and/or use of iterative reconstruction technique. CONTRAST:  76m OMNIPAQUE IOHEXOL 350 MG/ML SOLN COMPARISON:  Noncontrast head CT performed earlier today 10/04/2022. FINDINGS: CTA NECK FINDINGS Aortic arch: Common origin of the innominate and left common carotid arteries. No hemodynamically significant innominate or proximal subclavian artery stenosis. Right carotid system: CCA and ICA patent within the neck without stenosis or significant atherosclerotic disease. Left carotid system: CCA and ICA patent within the neck no stenosis. Mild atherosclerotic plaque about the carotid bifurcation. Tortuosity and partially retropharyngeal course of the cervical ICA. Vertebral arteries: Patent within the neck without stenosis or significant atherosclerotic disease. Skeleton: Cervical spondylosis. No acute fracture or aggressive osseous lesion. Other neck: Multiple thyroid nodules measuring up to 13 mm, not meeting consensus criteria for ultrasound follow-up based on size. No follow-up imaging recommended. Reference: J Am Coll Radiol. 2015 Feb;12(2): 143-50. 8 mm nodule within the right parotid gland (series 7, image 146). Upper chest: 6 mm pulmonary nodule within the right upper lobe (series 7, image 294). Review of the MIP images confirms the above findings CTA HEAD FINDINGS Anterior circulation: The intracranial internal carotid arteries are patent. Atherosclerotic  plaque within both vessels with no more than mild stenosis. The M1 middle cerebral arteries are patent. No M2 proximal branch occlusion or high-grade proximal stenosis. The anterior cerebral arteries are patent. No intracranial aneurysm is identified. Posterior circulation: The intracranial vertebral arteries are patent. The basilar artery is patent. The posterior cerebral arteries are patent. Posterior communicating arteries are present, bilaterally. Venous sinuses: Within the limitations of contrast timing, no convincing thrombus. Anatomic variants: None significant. Review of the MIP images confirms the above findings CT Brain Perfusion Findings: CBF (<30%) Volume: 017mPerfusion (Tmax>6.0s) volume: 56m456mismatch Volume: 56mL71mfarction Location:None identified. No emergent large vessel occlusion identified. This result, and the CT perfusion head results, were called by telephone at the time of interpretation on 10/04/2022 at 5:53 pm to provider [facility] verbally acknowledged these results. IMPRESSION: CTA neck: 1. The common carotid, internal carotid and vertebral arteries are patent within the neck without stenosis. Mild atherosclerotic plaque about the left carotid bifurcation. 2. 6 mm pulmonary nodule within the right upper lobe. Non-contrast chest CT at 6-12 months is recommended. If the nodule is stable at time of  repeat CT, then future CT at 18-24 months (from today's scan) is considered optional for low-risk patients, but is recommended for high-risk patients. This recommendation follows the consensus statement: Guidelines for Management of Incidental Pulmonary Nodules Detected on CT Images: From the Fleischner Society 2017; Radiology 2017; 284:228-243. 3. 8 mm nodule within the right parotid gland, which may reflect an enlarged intraparotid lymph node or primary parotid neoplasm. Nonemergent ENT follow-up recommended. CTA head: 1. No intracranial large vessel occlusion or proximal high-grade  arterial stenosis identified. 2. Atherosclerotic plaque within the intracranial internal carotid arteries with no more than mild stenosis. CT perfusion head: The perfusion software identifies no core infarct. The perfusion software identifies no critically hypoperfused parenchyma (utilizing the Tmax>6 seconds threshold). No mismatch volume reported. Electronically Signed   By: Kellie Simmering D.O.   On: 10/04/2022 18:15   DG Chest Portable 1 View  Result Date: 10/04/2022 CLINICAL DATA:  HTN, dyspnea EXAM: PORTABLE CHEST 1 VIEW COMPARISON:  09/25/2022 chest radiograph. FINDINGS: Stable cardiomediastinal silhouette with mild cardiomegaly. No pneumothorax. No pleural effusion. Lungs appear clear, with no acute consolidative airspace disease and no pulmonary edema. IMPRESSION: Mild cardiomegaly without pulmonary edema. No active pulmonary disease. Electronically Signed   By: Ilona Sorrel M.D.   On: 10/04/2022 17:53   CT HEAD WO CONTRAST  Result Date: 10/04/2022 CLINICAL DATA:  Neuro deficit, acute, stroke suspected. Hyperglycemia. Headache. Right eye visual disturbance. EXAM: CT HEAD WITHOUT CONTRAST TECHNIQUE: Contiguous axial images were obtained from the base of the skull through the vertex without intravenous contrast. RADIATION DOSE REDUCTION: This exam was performed according to the departmental dose-optimization program which includes automated exposure control, adjustment of the mA and/or kV according to patient size and/or use of iterative reconstruction technique. COMPARISON:  None Available. FINDINGS: Brain: The brain shows a normal appearance without evidence of malformation, atrophy, old or acute small or large vessel infarction, mass lesion, hemorrhage, hydrocephalus or extra-axial collection. Vascular: There is atherosclerotic calcification of the major vessels at the base of the brain. Skull: Normal.  No traumatic finding.  No focal bone lesion. Sinuses/Orbits: Sinuses are clear. Orbits appear  normal. Mastoids are clear. Other: None significant IMPRESSION: No acute CT finding. Normal appearance of the brain for age. Atherosclerotic calcification of the major vessels at the base of the brain. Electronically Signed   By: Nelson Chimes M.D.   On: 10/04/2022 14:53    Procedures Procedures    Medications Ordered in ED Medications  fluorescein ophthalmic strip 1 strip (1 strip Right Eye Given 10/04/22 2155)  tetracaine (PONTOCAINE) 0.5 % ophthalmic solution 2 drop (2 drops Right Eye Given 10/04/22 2155)  iohexol (OMNIPAQUE) 350 MG/ML injection 75 mL (75 mLs Intravenous Contrast Given 10/04/22 1745)  iohexol (OMNIPAQUE) 350 MG/ML injection 50 mL (50 mLs Intravenous Contrast Given 10/04/22 1752)  fentaNYL (SUBLIMAZE) injection 50 mcg (50 mcg Intravenous Given 10/04/22 1806)  metoCLOPramide (REGLAN) injection 10 mg (10 mg Intravenous Given 10/04/22 2153)  diphenhydrAMINE (BENADRYL) injection 25 mg (25 mg Intravenous Given 10/04/22 2155)  ketorolac (TORADOL) 15 MG/ML injection 15 mg (15 mg Intravenous Given 10/04/22 2153)    ED Course/ Medical Decision Making/ A&P Clinical Course as of 10/04/22 2304  Mon Oct 04, 2022  1719 After my initial evaluation, I discussed case with Dr. Lorrin Goodell of neurology. Given the LNW and eye symptoms, advises to call a code stroke. No TNK but may be thrombectomy candidate depending on CTA findings.  [SG]  2111 Patient states her headache is.  Her  MRI is unremarkable.  Discussed with Dr. Leonel Ramsay, likely a complicated migraine.  Will treat her headache.  Given her ocular symptoms resolved I think it is unlikely this is a primary eye problem. [SG]    Clinical Course User Index [SG] Sherwood Gambler, MD                             Medical Decision Making Amount and/or Complexity of Data Reviewed Labs: ordered.    Details: Troponin negative.  No AKI or significant anemia compared to baseline. Radiology: ordered and independent interpretation performed.     Details: CT head without head bleed.  MRI without stroke. ECG/medicine tests: independent interpretation performed.    Details: No acute ischemia.  Risk Prescription drug management.   Patient presents with headache.  Seems like is most likely a complicated migraine.  As above a code stroke was called though no large vessel occlusion and ultimately no stroke on MRI.  No evidence of PRES. her eye was already feeling better as above and after headache cocktail her headache is significantly better and she feels well enough for discharge.  Her blood pressure is also come down though still elevated.  She will need to follow-up with PCP.  However I do not think this is a hypertensive emergency.  Discussed we could evaluate her eye a little closer and while I do not think this would cause neurosymptoms or right arm/leg symptoms, certainly we would not want to miss glaucoma.  However patient is feeling better and the eye has essentially resolved so she would like to defer this.  It seems unlikely to be temporal arteritis.  Will discharge home with return precautions and outpatient neuro follow-up.  She does carry history of prior migraines but nothing like this.        Final Clinical Impression(s) / ED Diagnoses Final diagnoses:  Complicated migraine  Hypertensive urgency    Rx / DC Orders ED Discharge Orders          Ordered    Ambulatory referral to Neurology       Comments: An appointment is requested in approximately: 2 weeks   10/04/22 2254              Sherwood Gambler, MD 10/04/22 [phone]

## 2022-10-04 NOTE — Discharge Instructions (Addendum)
If you develop continued, recurrent, or worsening headache, fever, neck stiffness, vomiting, blurry or double vision, eye pain, weakness or numbness in your arms or legs, trouble speaking, or any other new/concerning symptoms then return to the ER for evaluation.

## 2022-10-04 NOTE — Code Documentation (Signed)
Katie Woodard is a 57 yr old female with a PMH of CKD, Obesity, Migraine, HTN, obesity who presents to [facility] on 10/04/2022 with a complaint of H/a and pain all over. Pt is from home where she was last known well last night at 2000, and is now c/o severe H/A, right sided visual disturbance, and rt sided weakness which has been present since awakening this morning. She is not on any known blood thinners.     Code stroke activated in ED room 5. Stroke team to pt. Labs, CBG have been obtained. Pt is hypertensive. Airway has been cleared by EDP. Pt to CT with Stroke Team. NIHSS 6. Pt with rt hemianopia, rt arm and leg weakness, and right sensory loss. The following imaging was obtained: CT, CTA CTP. Per Dr. Lorrin Goodell, CT neg for acute hemorrhage. CTA neg for LVO, with no perfusion mismatch present.     Pt back to room 5 where her workup will continue.She was given fentanyl for pain.  She will need q 2 hr VS and NIHSS. Pt not a candidate for thrombolytic as OOW. Pt ineligible for mechanical thrombectomy as no LVO. Bedside handoff with EDRN complete.

## 2022-10-04 NOTE — ED Notes (Signed)
Pt transported to MRI 

## 2022-10-06 ENCOUNTER — Encounter: Payer: Self-pay | Admitting: Neurology

## 2022-10-07 DIAGNOSIS — E1165 Type 2 diabetes mellitus with hyperglycemia: Secondary | ICD-10-CM | POA: Diagnosis not present

## 2022-10-07 DIAGNOSIS — K219 Gastro-esophageal reflux disease without esophagitis: Secondary | ICD-10-CM | POA: Diagnosis not present

## 2022-10-07 DIAGNOSIS — I10 Essential (primary) hypertension: Secondary | ICD-10-CM | POA: Diagnosis not present

## 2022-10-07 DIAGNOSIS — Z0001 Encounter for general adult medical examination with abnormal findings: Secondary | ICD-10-CM | POA: Diagnosis not present

## 2022-10-08 LAB — LAB REPORT - SCANNED
A1c: 6.4
Albumin, Urine POC: 1.3
Albumin/Creatinine Ratio, Urine, POC: 12
Creatinine, POC: 108 mg/dL
EGFR: 107

## 2022-10-15 ENCOUNTER — Encounter: Payer: Self-pay | Admitting: Neurology

## 2022-10-15 ENCOUNTER — Ambulatory Visit: Payer: 59 | Admitting: Neurology

## 2022-10-15 DIAGNOSIS — Z029 Encounter for administrative examinations, unspecified: Secondary | ICD-10-CM

## 2022-10-15 NOTE — Progress Notes (Deleted)
NEUROLOGY CONSULTATION NOTE  Katie Woodard MRN: [id number] DOB: [birth year]  Referring provider: Sherwood Gambler, MD (ED referral) Primary care provider: Rachell Cipro, MD  Reason for consult:  headache  Assessment/Plan:   ***   Subjective:  Katie Woodard is a 57 year old female with HTN, CKD, DM II, DDD of cervical spine, and GERD who presents for headache.  History supplemented by ED note.  She developed a headache on 3/10, but was worse when she woke up the following morning.  ***.  Over the previous couple of days, her SBP was around 180 when it is usually around 150.  She went to the ED for further evaluation.  Blood pressure was 162/101.  Sed rate was 18 and CRP 1.  CT perfusion head was negative.  CTA head and neck showed atherosclerosis within the left carotid bifurcation and bilateral intracranial carotid arteries but no LVO or hemodynamically significant stenosis.  MRI of brain revealed mild chronic small vessel ischemic changes but no acute intracranial abnormality.  She was ***  Past NSAIDS/analgesics:  naproxen, tramadol Past abortive triptans:  *** Past abortive ergotamine:  none Past muscle relaxants:  methocarbamol Past anti-emetic:  ondansetron Past antihypertensive medications:  amlodipine Past antidepressant medications:  *** Past anticonvulsant medications:  *** Past anti-CGRP:  none Past vitamins/Herbal/Supplements:  none Past antihistamines/decongestants:  none Other past therapies:  ***  Current NSAIDS/analgesics:  acetaminophen, ibuprofen Current triptans:  none Current ergotamine:  none Current anti-emetic:  none Current muscle relaxants:  none Current Antihypertensive medications:  none Current Antidepressant medications:  none Current Anticonvulsant medications:  none Current anti-CGRP:  none Current Vitamins/Herbal/Supplements:  none Current Antihistamines/Decongestants:  none Other therapy:  none   Caffeine:  *** Alcohol:   *** Smoker:  *** Diet:  *** Exercise:  *** Depression:  ***; Anxiety:  *** Other pain:  *** Sleep hygiene:  *** Family history of headache:  ***      PAST MEDICAL HISTORY: Past Medical History:  Diagnosis Date   Abdominal pain, chronic, right lower quadrant 01/31/2014   Chronic kidney disease    mases in kidney   DDD (degenerative disc disease), cervical    DDD (degenerative disc disease), cervical 01/21/2022   Diabetes mellitus without complication ([place])    Diverticulitis    DIVERTICULITIS OF COLON 02/26/2009   Qualifier: Diagnosis of  By: Hassell Done FNP, Nykedtra     GERD (gastroesophageal reflux disease)    Takes OTC meds   Headache    migraines twice a week   History of blood transfusion    Hypertension    Hypokalemia    Microcytic anemia 11/13/2008   Qualifier: Diagnosis of  By: Hassell Done FNP, Nykedtra     MIGRAINE HEADACHE 11/13/2008   Qualifier: Diagnosis of  By: Hassell Done FNP, Nykedtra     Ovarian cyst    Uterine fibroid     PAST SURGICAL HISTORY: Past Surgical History:  Procedure Laterality Date   [place]  2005   [facility]   COLON SURGERY  01/2020   FLEXIBLE SIGMOIDOSCOPY N/A 02/13/2020   Procedure: FLEXIBLE SIGMOIDOSCOPY;  Surgeon: Ileana Roup, MD;  Location: [facility];  Service: General;  Laterality: N/A;   KNEE ARTHROPLASTY Right 09/10/2020   Procedure: COMPUTER ASSISTED TOTAL KNEE ARTHROPLASTY;  Surgeon: Rod Can, MD;  Location: [facility];  Service: Orthopedics;  Laterality: Right;  126min 3E   LAPAROSCOPIC APPENDECTOMY N/A 01/31/2014   Procedure: APPENDECTOMY LAPAROSCOPIC;  Surgeon:  Gwenyth Ober, MD;  Location: [place];  Service: General;  Laterality: N/A;    MEDICATIONS: Current Outpatient Medications on File Prior to Visit  Medication Sig Dispense Refill   acetaminophen (TYLENOL) 500 MG tablet Take 500-1,000 mg by mouth every 6 (six) hours as needed for mild pain, fever or headache.     albuterol  (VENTOLIN HFA) 108 (90 Base) MCG/ACT inhaler Inhale 2 puffs into the lungs every 4 (four) hours as needed for wheezing or shortness of breath. 18 g 0   cimetidine (TAGAMET) 200 MG tablet Take 1 tablet (200 mg total) by mouth daily.     ibuprofen (ADVIL) 800 MG tablet Take 1 tablet (800 mg total) by mouth 3 (three) times daily. 21 tablet 0   No current facility-administered medications on file prior to visit.    ALLERGIES: Allergies  Allergen Reactions   Dilaudid [Hydromorphone Hcl] Nausea And Vomiting and Other (See Comments)    "Head spinning sensation"    Amlodipine Nausea Only    FAMILY HISTORY: Family History  Problem Relation Age of Onset   Cancer Mother        breast   Breast cancer Mother        35s and again in her 27s   Cancer Sister        breast x2   Breast cancer Sister        early 51s and again in late 53s   Cancer Maternal Aunt        ovarian    Objective:  *** General: No acute distress.  Patient appears well-groomed.   Head:  Normocephalic/atraumatic Eyes:  fundi examined but not visualized Neck: supple, no paraspinal tenderness, full range of motion Back: No paraspinal tenderness Heart: regular rate and rhythm Lungs: Clear to auscultation bilaterally. Vascular: No carotid bruits. Neurological Exam: Mental status: alert and oriented to person, place, and time, speech fluent and not dysarthric, language intact. Cranial nerves: CN I: not tested CN II: pupils equal, round and reactive to light, visual fields intact CN III, IV, VI:  full range of motion, no nystagmus, no ptosis CN V: facial sensation intact. CN VII: upper and lower face symmetric CN VIII: hearing intact CN IX, X: gag intact, uvula midline CN XI: sternocleidomastoid and trapezius muscles intact CN XII: tongue midline Bulk & Tone: normal, no fasciculations. Motor:  muscle strength 5/5 throughout Sensation:  Pinprick, temperature and vibratory sensation intact. Deep Tendon Reflexes:   2+ throughout,  toes downgoing.   Finger to nose testing:  Without dysmetria.   Heel to shin:  Without dysmetria.   Gait:  Normal station and stride.  Romberg negative.    Thank you for allowing me to take part in the care of this patient.  Metta Clines, DO  CC: ***

## 2022-10-20 ENCOUNTER — Encounter: Payer: Self-pay | Admitting: Neurology

## 2022-10-20 DIAGNOSIS — K219 Gastro-esophageal reflux disease without esophagitis: Secondary | ICD-10-CM | POA: Diagnosis not present

## 2022-10-20 DIAGNOSIS — D509 Iron deficiency anemia, unspecified: Secondary | ICD-10-CM | POA: Diagnosis not present

## 2022-10-20 DIAGNOSIS — I10 Essential (primary) hypertension: Secondary | ICD-10-CM | POA: Diagnosis not present

## 2022-10-20 DIAGNOSIS — E1165 Type 2 diabetes mellitus with hyperglycemia: Secondary | ICD-10-CM | POA: Diagnosis not present

## 2022-10-21 ENCOUNTER — Ambulatory Visit: Payer: 59 | Admitting: Podiatry

## 2022-11-04 ENCOUNTER — Encounter: Payer: 59 | Attending: Internal Medicine | Admitting: Skilled Nursing Facility1

## 2022-11-04 DIAGNOSIS — E119 Type 2 diabetes mellitus without complications: Secondary | ICD-10-CM | POA: Insufficient documentation

## 2022-11-08 ENCOUNTER — Encounter: Payer: Self-pay | Admitting: Skilled Nursing Facility1

## 2022-11-08 NOTE — Progress Notes (Signed)
Other Dx: GERD, arhtitis, anemia, kidney disease, HTN  DM medications: Metformin    Patient was seen on 11/04/2022 for the first of a series of three diabetes self-management courses at [facility].  Patient Education Plan per assessed needs and concerns is to attend three course education program for Diabetes Self Management Education.  A1C was 6.4   The following learning objectives were met by the patient during this class: Describe diabetes, types of diabetes and pathophysiology State some common risk factors for diabetes Defines the role of glucose and insulin Describe the relationship between diabetes and cardiovascular and other risks State the members of the Healthcare Team States the rationale for glucose monitoring and when to test State their individual Target Range State the importance of logging glucose readings and how to interpret the readings Identifies A1C target Explain the correlation between A1c and eAG values State symptoms and treatment of high blood glucose and low blood glucose Explain proper technique for glucose testing and identify proper sharps disposal  Handouts given during class include: How to Thrive:  A Guide for Your Journey with Diabetes by the ADA Meal Plan Card and carbohydrate content list Dietary intake form Low Sodium Flavoring Tips Types of Fats Dining Out Label reading Snack list The diabetes portion plate Diabetes Resources A1c to eAG Conversion Chart Blood Glucose Log Diabetes Recommended Care Schedule Support Group Diabetes Success Plan Core Class Satisfaction Survey   Follow-Up Plan: Attend core 2

## 2022-11-11 ENCOUNTER — Encounter: Payer: 59 | Admitting: Skilled Nursing Facility1

## 2022-11-11 DIAGNOSIS — E119 Type 2 diabetes mellitus without complications: Secondary | ICD-10-CM | POA: Diagnosis not present

## 2022-11-15 ENCOUNTER — Encounter: Payer: Self-pay | Admitting: Skilled Nursing Facility1

## 2022-11-15 NOTE — Progress Notes (Signed)

## 2022-11-18 ENCOUNTER — Ambulatory Visit: Payer: 59 | Admitting: Skilled Nursing Facility1

## 2022-11-18 ENCOUNTER — Ambulatory Visit (INDEPENDENT_AMBULATORY_CARE_PROVIDER_SITE_OTHER): Payer: 59 | Admitting: Podiatry

## 2022-11-18 DIAGNOSIS — L84 Corns and callosities: Secondary | ICD-10-CM | POA: Diagnosis not present

## 2022-11-18 DIAGNOSIS — E1142 Type 2 diabetes mellitus with diabetic polyneuropathy: Secondary | ICD-10-CM | POA: Diagnosis not present

## 2022-11-18 NOTE — Progress Notes (Signed)
  Subjective:  Patient ID: Katie Woodard, female    DOB: 1965/08/06,  MRN: [id number]  Chief Complaint  Patient presents with   Nail Problem    DFC/Foot exam    57 y.o. female presents with the above complaint. History confirmed with patient. Patient presenting for diabetic foot exam. Patient does have a callus on the left foot but is taking care of nails at the nail salon. Patient does have a history of T2DM. Patient does/does not have callus present located at the left 2nd metatarsal head causing pain.   Objective:  Physical Exam: warm, good capillary refill nail exam onychomycosis of the toenails DP pulses palpable, PT pulses palpable, and protective sensation absent SWM monofilament exam absent sensation to the level of the midfoot bilaterally Left Foot: Callus subsecond metatarsal head with central hyperkeratotic core consistent with porokeratosis. Right Foot: Thickening and dystrophy of the toenails however they are not significantly elongated or thickened to require debridement today  Assessment:   1. Pre-ulcerative calluses   2. DM type 2 with diabetic peripheral neuropathy      Plan:  Patient was evaluated and treated and all questions answered.  # DM2 with neuropathy Patient educated on diabetes. Discussed proper diabetic foot care and discussed risks and complications of disease. Educated patient in depth on reasons to return to the office immediately should he/she discover anything concerning or new on the feet. All questions answered. Discussed proper shoes as well.    #Hyperkeratotic lesions/pre ulcerative calluses present subsecond metatarsal head left foot All symptomatic hyperkeratoses x 1 separate lesions were safely debrided with a sterile #10 blade to patient's level of comfort without incident. We discussed preventative and palliative care of these lesions including supportive and accommodative shoegear, padding, prefabricated and custom molded accommodative  orthoses, use of a pumice stone and lotions/creams daily.   Return in about 6 months (around 05/20/2023) for Routine DM exam.         Corinna Gab, DPM [facility] / [facility]

## 2022-12-14 ENCOUNTER — Ambulatory Visit: Payer: 59 | Admitting: Skilled Nursing Facility1

## 2023-02-07 ENCOUNTER — Ambulatory Visit: Payer: 59 | Admitting: Neurology

## 2023-02-08 NOTE — Progress Notes (Deleted)
NEUROLOGY CONSULTATION NOTE  Katie Woodard MRN: [id number] DOB: January 24, 1966  Referring provider: Pricilla Loveless, MD (ED referral) Primary care provider: Maryelizabeth Rowan, MD  Reason for consult:  complicated migraine  Assessment/Plan:   ***   Subjective:  Katie Woodard is a 57 year old female with DM 2, DDD of cervical spine, HTN, and GERD who presents for complicated migraine.  History supplemented by ED note.  CTA and MRI personally reviewed.    On 10/03/2022, she developed ***.  Her blood pressure had been running around 180 when normally it is around 150.  The next morning, the headache was worse.  ***.  She went to the ED.  CTA head and neck showed some mild intracranial atherosclerosis but no LVO or hemodynamically significant stenosis. CT perfusion negative.  MRI of brain showed mild small vessel ischemic changes but no acute infarct, bleed or evidence of PRES.  She was treated for migraine with headache cocktail.  ***  Past NSAIDS/analgesics:  *** Past abortive triptans:  *** Past abortive ergotamine:  *** Past muscle relaxants:  *** Past anti-emetic:  *** Past antihypertensive medications:  *** Past antidepressant medications:  *** Past anticonvulsant medications:  *** Past anti-CGRP:  *** Past vitamins/Herbal/Supplements:  *** Past antihistamines/decongestants:  *** Other past therapies:  ***  Current NSAIDS/analgesics:  *** Current triptans:  *** Current ergotamine:  *** Current anti-emetic:  *** Current muscle relaxants:  *** Current Antihypertensive medications:  *** Current Antidepressant medications:  *** Current Anticonvulsant medications:  *** Current anti-CGRP:  *** Current Vitamins/Herbal/Supplements:  *** Current Antihistamines/Decongestants:  *** Other therapy:  *** Birth control:  *** Other medications:  ***   Caffeine:  *** Alcohol:  *** Smoker:  *** Diet:  *** Exercise:  *** Depression:  ***; Anxiety:  *** Other pain:  *** Sleep  hygiene:  *** Family history of headache:  ***      PAST MEDICAL HISTORY: Past Medical History:  Diagnosis Date   Abdominal pain, chronic, right lower quadrant 01/31/2014   Chronic kidney disease    mases in kidney   DDD (degenerative disc disease), cervical    DDD (degenerative disc disease), cervical 01/21/2022   Diabetes mellitus without complication ([facility])    Diverticulitis    DIVERTICULITIS OF COLON 02/26/2009   Qualifier: Diagnosis of  By: Daphine Deutscher FNP, Nykedtra     GERD (gastroesophageal reflux disease)    Takes OTC meds   Headache    migraines twice a week   History of blood transfusion    Hypertension    Hypokalemia    Microcytic anemia 11/13/2008   Qualifier: Diagnosis of  By: Daphine Deutscher FNP, Nykedtra     MIGRAINE HEADACHE 11/13/2008   Qualifier: Diagnosis of  By: Daphine Deutscher FNP, Nykedtra     Ovarian cyst    Uterine fibroid     PAST SURGICAL HISTORY: Past Surgical History:  Procedure Laterality Date   APPENDECTOMY     CESAREAN SECTION  1988   CHOLECYSTECTOMY  2005   [facility]   COLON SURGERY  01/2020   FLEXIBLE SIGMOIDOSCOPY N/A 02/13/2020   Procedure: FLEXIBLE SIGMOIDOSCOPY;  Surgeon: Andria Meuse, MD;  [facility]: [facility];  Service: General;  Laterality: N/A;   KNEE ARTHROPLASTY Right 09/10/2020   Procedure: COMPUTER ASSISTED TOTAL KNEE ARTHROPLASTY;  Surgeon: Samson Frederic, MD;  [facility];  Service: Orthopedics;  Laterality: Right;  3E   LAPAROSCOPIC APPENDECTOMY N/A 01/31/2014   Procedure: APPENDECTOMY LAPAROSCOPIC;  Surgeon: Cherylynn Ridges, MD;  Location: [facility]  OR;  Service: General;  Laterality: N/A;    MEDICATIONS: Current Outpatient Medications on File Prior to Visit  Medication Sig Dispense Refill   acetaminophen (TYLENOL) 500 MG tablet Take 500-1,000 mg by mouth every 6 (six) hours as needed for mild pain, fever or headache.     albuterol (VENTOLIN HFA) 108 (90 Base) MCG/ACT inhaler Inhale 2 puffs into the lungs every 4 (four) hours as needed for  wheezing or shortness of breath. 18 g 0   cimetidine (TAGAMET) 200 MG tablet Take 1 tablet (200 mg total) by mouth daily.     ibuprofen (ADVIL) 800 MG tablet Take 1 tablet (800 mg total) by mouth 3 (three) times daily. 21 tablet 0   No current facility-administered medications on file prior to visit.    ALLERGIES: Allergies  Allergen Reactions   Dilaudid [Hydromorphone Hcl] Nausea And Vomiting and Other (See Comments)    "Head spinning sensation"    Amlodipine Nausea Only    FAMILY HISTORY: Family History  Problem Relation Age of Onset   Cancer Mother        breast   Breast cancer Mother        56s and again in her 25s   Cancer Sister        breast x2   Breast cancer Sister        early 34s and again in late 77s   Cancer Maternal Aunt        ovarian    Objective:  *** General: No acute distress.  Patient appears well-groomed.   Head:  Normocephalic/atraumatic Eyes:  fundi examined but not visualized Neck: supple, no paraspinal tenderness, full range of motion Back: No paraspinal tenderness Heart: regular rate and rhythm Lungs: Clear to auscultation bilaterally. Vascular: No carotid bruits. Neurological Exam: Mental status: alert and oriented to person, place, and time, speech fluent and not dysarthric, language intact. Cranial nerves: CN I: not tested CN II: pupils equal, round and reactive to light, visual fields intact CN III, IV, VI:  full range of motion, no nystagmus, no ptosis CN V: facial sensation intact. CN VII: upper and lower face symmetric CN VIII: hearing intact CN IX, X: gag intact, uvula midline CN XI: sternocleidomastoid and trapezius muscles intact CN XII: tongue midline Bulk & Tone: normal, no fasciculations. Motor:  muscle strength 5/5 throughout Sensation:  Pinprick, temperature and vibratory sensation intact. Deep Tendon Reflexes:  2+ throughout,  toes downgoing.   Finger to nose testing:  Without dysmetria.   Heel to shin:  Without  dysmetria.   Gait:  Normal station and stride.  Romberg negative.    Thank you for allowing me to take part in the care of this patient.  Shon Millet, DO  CC: Maryelizabeth Rowan, MD

## 2023-02-09 ENCOUNTER — Ambulatory Visit: Payer: 59 | Admitting: Neurology

## 2023-03-10 DIAGNOSIS — E1165 Type 2 diabetes mellitus with hyperglycemia: Secondary | ICD-10-CM | POA: Diagnosis not present

## 2023-03-10 DIAGNOSIS — D508 Other iron deficiency anemias: Secondary | ICD-10-CM | POA: Diagnosis not present

## 2023-03-10 DIAGNOSIS — I10 Essential (primary) hypertension: Secondary | ICD-10-CM | POA: Diagnosis not present

## 2023-03-10 DIAGNOSIS — K219 Gastro-esophageal reflux disease without esophagitis: Secondary | ICD-10-CM | POA: Diagnosis not present

## 2023-03-16 ENCOUNTER — Other Ambulatory Visit: Payer: Self-pay | Admitting: Family Medicine

## 2023-03-16 DIAGNOSIS — Z1231 Encounter for screening mammogram for malignant neoplasm of breast: Secondary | ICD-10-CM

## 2023-04-28 DIAGNOSIS — E1165 Type 2 diabetes mellitus with hyperglycemia: Secondary | ICD-10-CM | POA: Diagnosis not present

## 2023-04-28 DIAGNOSIS — I10 Essential (primary) hypertension: Secondary | ICD-10-CM | POA: Diagnosis not present

## 2023-04-28 DIAGNOSIS — D508 Other iron deficiency anemias: Secondary | ICD-10-CM | POA: Diagnosis not present

## 2023-05-20 ENCOUNTER — Ambulatory Visit: Payer: 59 | Admitting: Podiatry

## 2023-06-02 ENCOUNTER — Encounter: Payer: Self-pay | Admitting: Podiatry

## 2023-06-02 ENCOUNTER — Ambulatory Visit (INDEPENDENT_AMBULATORY_CARE_PROVIDER_SITE_OTHER): Payer: 59 | Admitting: Podiatry

## 2023-06-02 VITALS — Ht 59.0 in | Wt 220.0 lb

## 2023-06-02 DIAGNOSIS — L84 Corns and callosities: Secondary | ICD-10-CM | POA: Diagnosis not present

## 2023-06-02 DIAGNOSIS — E1142 Type 2 diabetes mellitus with diabetic polyneuropathy: Secondary | ICD-10-CM | POA: Diagnosis not present

## 2023-06-02 NOTE — Progress Notes (Deleted)
NEUROLOGY CONSULTATION NOTE  Katie Woodard MRN: [id number] DOB: 1965-08-21  Referring provider: Pricilla Loveless, MD (ED referral) Primary care provider: Maryelizabeth Rowan, MD  Reason for consult:  complicated migraine  Assessment/Plan:   ***   Subjective:  Katie Woodard is a 57 year old ***-handed female with CKD, DM 2, HTN, anemia, and DDD of cervical spine who presents for "complicated migraine".  History supplemented by ED and primary care notes.  MRI of brain, CT perfusion and CTA head and neck personally reviewed.  Se presented to the ED on 10/04/2022.  Described right temporal and ocular pain with blurred vision in the right eye and right sided upper and lower extremity numbness and weakness.  Headache developed the previous day but symptoms worse day of ED visit. MRI of brain without contrast revealed mild chronic small vessel ischemic changes but no acute abnormalities.  CTA head and neck revealed atherosclerotic plaque within the intracranial ICAs but no LVO or hemodynamically significant stenosis.  CT perfusion negative for acute infarct.  Sed rate 18 and CRP was 1.0.  Diagnosed with complicated migraine.    Past NSAIDS/analgesics:  acetaminophen, naproxen Past abortive triptans:  *** Past abortive ergotamine:  *** Past muscle relaxants:  Robaxin Past anti-emetic:  Zofran, Reglan Past antihypertensive medications:  *** Past antidepressant medications:  *** Past anticonvulsant medications:  *** Past anti-CGRP:  *** Past vitamins/Herbal/Supplements:  *** Past antihistamines/decongestants:  *** Other past therapies:  ***  Current NSAIDS/analgesics:  ibuprofen 800mg  Current triptans:  *** Current ergotamine:  *** Current anti-emetic:  *** Current muscle relaxants:  *** Current Antihypertensive medications:  *** Current Antidepressant medications:  *** Current Anticonvulsant medications:  *** Current anti-CGRP:  *** Current Vitamins/Herbal/Supplements:   *** Current Antihistamines/Decongestants:  *** Other therapy:  *** Birth control:  *** Other medications:  ***   Caffeine:  *** Alcohol:  *** Smoker:  *** Diet:  *** Exercise:  *** Depression:  ***; Anxiety:  *** Other pain:  *** Sleep hygiene:  *** Family history of headache:  ***      PAST MEDICAL HISTORY: Past Medical History:  Diagnosis Date   Abdominal pain, chronic, right lower quadrant 01/31/2014   Chronic kidney disease    mases in kidney   DDD (degenerative disc disease), cervical    DDD (degenerative disc disease), cervical 01/21/2022   Diabetes mellitus without complication ([facility])    Diverticulitis    DIVERTICULITIS OF COLON 02/26/2009   Qualifier: Diagnosis of  By: Katie Deutscher FNP, Nykedtra     GERD (gastroesophageal reflux disease)    Takes OTC meds   Headache    migraines twice a week   History of blood transfusion    Hypertension    Hypokalemia    Microcytic anemia 11/13/2008   Qualifier: Diagnosis of  By: Katie Deutscher FNP, Nykedtra     MIGRAINE HEADACHE 11/13/2008   Qualifier: Diagnosis of  By: Katie Deutscher FNP, Nykedtra     Ovarian cyst    Uterine fibroid     PAST SURGICAL HISTORY: Past Surgical History:  Procedure Laterality Date   APPENDECTOMY     CESAREAN SECTION  1988   CHOLECYSTECTOMY  2005   [facility]   COLON SURGERY  01/2020   FLEXIBLE SIGMOIDOSCOPY N/A 02/13/2020   Procedure: FLEXIBLE SIGMOIDOSCOPY;  Surgeon: Andria Meuse, MD;  [facility]: [facility];  Service: General;  Laterality: N/A;   KNEE ARTHROPLASTY Right 09/10/2020   Procedure: COMPUTER ASSISTED TOTAL KNEE ARTHROPLASTY;  Surgeon: Samson Frederic, MD;  [facility];  Service: Orthopedics;  Laterality: Right;  3E   LAPAROSCOPIC APPENDECTOMY N/A 01/31/2014   Procedure: APPENDECTOMY LAPAROSCOPIC;  Surgeon: Cherylynn Ridges, MD;  Location: [facility];  Service: General;  Laterality: N/A;    MEDICATIONS: Current Outpatient Medications on File Prior to Visit  Medication Sig Dispense Refill    acetaminophen (TYLENOL) 500 MG tablet Take 500-1,000 mg by mouth every 6 (six) hours as needed for mild pain, fever or headache.     albuterol (VENTOLIN HFA) 108 (90 Base) MCG/ACT inhaler Inhale 2 puffs into the lungs every 4 (four) hours as needed for wheezing or shortness of breath. 18 g 0   cimetidine (TAGAMET) 200 MG tablet Take 1 tablet (200 mg total) by mouth daily.     ibuprofen (ADVIL) 800 MG tablet Take 1 tablet (800 mg total) by mouth 3 (three) times daily. 21 tablet 0   No current facility-administered medications on file prior to visit.    ALLERGIES: Allergies  Allergen Reactions   Dilaudid [Hydromorphone Hcl] Nausea And Vomiting and Other (See Comments)    "Head spinning sensation"    Amlodipine Nausea Only    FAMILY HISTORY: Family History  Problem Relation Age of Onset   Cancer Mother        breast   Breast cancer Mother        60s and again in her 42s   Cancer Sister        breast x2   Breast cancer Sister        early 23s and again in late 75s   Cancer Maternal Aunt        ovarian    Objective:  *** General: No acute distress.  Patient appears well-groomed.   Head:  Normocephalic/atraumatic Eyes:  fundi examined but not visualized Neck: supple, no paraspinal tenderness, full range of motion Back: No paraspinal tenderness Heart: regular rate and rhythm Lungs: Clear to auscultation bilaterally. Vascular: No carotid bruits. Neurological Exam: Mental status: alert and oriented to person, place, and time, speech fluent and not dysarthric, language intact. Cranial nerves: CN I: not tested CN II: pupils equal, round and reactive to light, visual fields intact CN III, IV, VI:  full range of motion, no nystagmus, no ptosis CN V: facial sensation intact. CN VII: upper and lower face symmetric CN VIII: hearing intact CN IX, X: gag intact, uvula midline CN XI: sternocleidomastoid and trapezius muscles intact CN XII: tongue midline Bulk & Tone: normal, no  fasciculations. Motor:  muscle strength 5/5 throughout Sensation:  Pinprick, temperature and vibratory sensation intact. Deep Tendon Reflexes:  2+ throughout,  toes downgoing.   Finger to nose testing:  Without dysmetria.   Heel to shin:  Without dysmetria.   Gait:  Normal station and stride.  Romberg negative.    Thank you for allowing me to take part in the care of this patient.  Shon Millet, DO  CC: ***

## 2023-06-02 NOTE — Progress Notes (Signed)
  Subjective:  Patient ID: Katie Woodard, female    DOB: [birth year],  MRN: [id number]  Chief Complaint  Patient presents with   Diabetes    Patient is here for [facility]    57 y.o. female presents with the above complaint. History confirmed with patient. Patient presenting for diabetic foot exam. Patient does have a callus on the left foot but is taking care of nails at the nail salon. Patient does have a history of T2DM. Patient does  have callus present located at the left 2nd metatarsal head and plantar fifth metatarsal head causing pain.   Objective:  Physical Exam: warm, good capillary refill nail exam onychomycosis of the toenails DP pulses palpable, PT pulses palpable, and protective sensation absent SWM monofilament exam absent sensation to the level of the midfoot bilaterally Left Foot: Callus subsecond and subfifth metatarsal head with central hyperkeratotic core consistent with porokeratosis.  Pain with palpation of these areas Right Foot: Thickening and dystrophy of the toenails however they are not significantly elongated or thickened to require debridement today  Assessment:   1. Pre-ulcerative calluses   2. DM type 2 with diabetic peripheral neuropathy (HCC)       Plan:  Patient was evaluated and treated and all questions answered.  # DM2 with neuropathy Patient educated on diabetes. Discussed proper diabetic foot care and discussed risks and complications of disease. Educated patient in depth on reasons to return to the office immediately should he/she discover anything concerning or new on the feet. All questions answered. Discussed proper shoes as well.    #Hyperkeratotic lesions/pre ulcerative calluses present subsecond metatarsal head left foot All symptomatic hyperkeratoses x 2 separate lesions were safely debrided with a sterile #10 blade to patient's level of comfort without incident. We discussed preventative and palliative care of these lesions including  supportive and accommodative shoegear, padding, prefabricated and custom molded accommodative orthoses, use of a pumice stone and lotions/creams daily.   Return in about 3 months (around 09/02/2023) for [facility].         Corinna Gab, DPM [facility] / [facility]

## 2023-06-06 ENCOUNTER — Ambulatory Visit: Payer: 59 | Admitting: Neurology

## 2023-06-14 NOTE — Progress Notes (Unsigned)
NEUROLOGY CONSULTATION NOTE  Katie Woodard MRN: [id number] DOB: 25-Apr-1966  Referring provider: Pricilla Loveless, MD (ED referral) Primary care provider: Maryelizabeth Rowan, MD  Reason for consult:  complicated migraine  Assessment/Plan:   Right sided headache.  Unclear etiology.  Possibly isolated migraine which would be diagnosis of exclusion as she has no prior history but it may have been triggered by elevated blood pressure and TMJ dysfunction.  Regardless, no recurrent episodes. Morning headaches (in conjunction with excessive daytime sleepiness and history of snoring) raises possibility of obstructive sleep apnea. Right upper and lower extremity weakness - I think due to pain related to her right shoulder and right knee (not symptom of TIA or complicated migraine) Uncontrolled hypertension   Refer to sleep medicine for evaluation of OSA Advised to follow up with her orthopedist regarding ongoing chronic right knee pain as well as right shoulder pain If she should have recurrence of headaches, consider following up with dentist regarding evaluation of TMJ dysfunction and possible mouth guard. Advised to follow up with PCP regarding ongoing uncontrolled hypertension Follow up with me in 6 months.   Subjective:  Katie Woodard is a 57 year old left-handed female with CKD, DM 2, HTN, anemia, and DDD of cervical spine who presents for "complicated migraine".  History supplemented by ED and primary care notes.  MRI of brain, CT perfusion and CTA head and neck personally reviewed.  Se presented to Urgent Care on 09/25/2022 due to not feeling well.  She started experiencing a severe right temporal headache radiating across the front of her head.  Noted blurred vision in right eye with photophobia.  No nausea, vomiting or phonophobia.  Also endorsed dyspnea, chest discomfort and generalized body aches.  She had recently had changes in her blood pressure medications due to continuing to  have uncontrolled high blood pressure.  She was diagnosed with upper viral illness.  Headaches persisted, so she went to the ED on 10/04/2022.  On exam, she was noted to have weakness in the right upper and lower extremities, also numbness as well.  MRI of brain without contrast revealed mild chronic small vessel ischemic changes but no acute abnormalities.  CTA head and neck revealed atherosclerotic plaque within the intracranial ICAs but no LVO or hemodynamically significant stenosis.  CT perfusion negative for acute infarct.  Sed rate 18 and CRP was 1.0.  Diagnosed with complicated migraine.  Denies prior history of migraines or other headaches.  Symptoms resolved over the next 48 hours.  No recurrence of these headaches.  However, she does wake up with a dull left frontal/temporal headache about 3 days a week.  It resolves later in the morning.  She does have disrupted sleep and has excessive daytime sleepiness.  She reports history of snoring.    She has a right knee replacement in 2022.  She continues to have numbness around the knee (lower thigh and below knee).  Hasn't followed by with orthopedics.  She also has right shoulder pain.   Past NSAIDS/analgesics:  acetaminophen, naproxen Past abortive triptans:  none Past abortive ergotamine:  none Past muscle relaxants:  Robaxin Past anti-emetic:  Zofran, Reglan Past antihypertensive medications:  none Past antidepressant medications:  none Past anticonvulsant medications:  none Past anti-CGRP:  none Other past therapies:  none  Current NSAIDS/analgesics:  ibuprofen 800mg  Current triptans:  none Current ergotamine:  none Current anti-emetic:  none Current muscle relaxants:  none Current Antihypertensive medications:  valsartan Current Antidepressant medications:  none Current  Anticonvulsant medications:  none Current anti-CGRP:  none Current Vitamins/Herbal/Supplements:  none Current Antihistamines/Decongestants:  none Other therapy:   none       PAST MEDICAL HISTORY: Past Medical History:  Diagnosis Date   Abdominal pain, chronic, right lower quadrant 01/31/2014   Chronic kidney disease    mases in kidney   DDD (degenerative disc disease), cervical    DDD (degenerative disc disease), cervical 01/21/2022   Diabetes mellitus without complication (HCC)    Diverticulitis    DIVERTICULITIS OF COLON 02/26/2009   Qualifier: Diagnosis of  By: Daphine Deutscher FNP, [place]     GERD (gastroesophageal reflux disease)    Takes OTC meds   Headache    migraines twice a week   History of blood transfusion    Hypertension    Hypokalemia    Microcytic anemia 11/13/2008   Qualifier: Diagnosis of  By: Daphine Deutscher FNP, [place]     MIGRAINE HEADACHE 11/13/2008   Qualifier: Diagnosis of  By: Daphine Deutscher FNP, [place]     Ovarian cyst    Uterine fibroid     PAST SURGICAL HISTORY: Past Surgical History:  Procedure Laterality Date   APPENDECTOMY     CESAREAN SECTION  1988   CHOLECYSTECTOMY  2005   [facility]   COLON SURGERY  01/2020   FLEXIBLE SIGMOIDOSCOPY N/A 02/13/2020   Procedure: FLEXIBLE SIGMOIDOSCOPY;  Surgeon: Andria Meuse, MD;  Location: [facility] ORS;  Service: General;  Laterality: N/A;   KNEE ARTHROPLASTY Right 09/10/2020   Procedure: COMPUTER ASSISTED TOTAL KNEE ARTHROPLASTY;  Surgeon: Samson Frederic, MD;  Location: [facility] OR[facility];  Service: Orthopedics;  Laterality: Right;  3E   LAPAROSCOPIC APPENDECTOMY N/A 01/31/2014   Procedure: APPENDECTOMY LAPAROSCOPIC;  Surgeon: Cherylynn Ridges, MD;  Location: [facility] OR;  Service: General;  Laterality: N/A;    MEDICATIONS: Current Outpatient Medications on File Prior to Visit  Medication Sig Dispense Refill   ibuprofen (ADVIL) 800 MG tablet Take 1 tablet (800 mg total) by mouth 3 (three) times daily. 21 tablet 0   No current facility-administered medications on file prior to visit.    ALLERGIES: Allergies  Allergen Reactions   Dilaudid [Hydromorphone Hcl] Nausea And Vomiting and Other (See  Comments)    "Head spinning sensation"    Amlodipine Nausea Only    FAMILY HISTORY: Family History  Problem Relation Age of Onset   Cancer Mother        breast   Breast cancer Mother        37s and again in her 28s   Cancer Sister        breast x2   Breast cancer Sister        early 45s and again in late 19s   Cancer Maternal Aunt        ovarian    Objective:  Blood pressure (!) 177/91, pulse 74, height 4\' 11"  (1.499 m), weight 218 lb (98.9 kg), SpO2 100%. General: No acute distress.  Patient appears well-groomed.   Head:  Normocephalic/atraumatic.  Tenderness to right TMJ. Eyes:  fundi examined but not visualized Neck: supple, bilateral paraspinal tenderness, full range of motion Heart: regular rate and rhythm Neurological Exam: Mental status: alert and oriented to person, place, and time, speech fluent and not dysarthric, language intact. Cranial nerves: CN I: not tested CN II: pupils equal, round and reactive to light, visual fields intact CN III, IV, VI:  full range of motion, no nystagmus, no ptosis CN V: facial sensation intact. CN VII: upper and lower  face symmetric CN VIII: hearing intact CN IX, X: gag intact, uvula midline CN XI: sternocleidomastoid and trapezius muscles intact CN XII: tongue midline Bulk & Tone: normal, no fasciculations. Motor:  muscle strength 5/5 throughout Sensation:  Pinprick sensation intact in upper extremities (unable to assess feet due to patient wearing stockings; vibratory sensation mildly reduced in toes. Deep Tendon Reflexes:  2+ throughout,  toes downgoing.   Finger to nose testing:  Without dysmetria.   Gait:  antalgic gait.  Romberg negative.    Thank you for allowing me to take part in the care of this patient.  Shon Millet, DO  CC: Jackie Plum, MD

## 2023-06-15 ENCOUNTER — Encounter: Payer: Self-pay | Admitting: Neurology

## 2023-06-15 ENCOUNTER — Ambulatory Visit (INDEPENDENT_AMBULATORY_CARE_PROVIDER_SITE_OTHER): Payer: Medicaid Other | Admitting: Neurology

## 2023-06-15 VITALS — BP 177/91 | HR 74 | Ht 59.0 in | Wt 218.0 lb

## 2023-06-15 DIAGNOSIS — M25511 Pain in right shoulder: Secondary | ICD-10-CM | POA: Diagnosis not present

## 2023-06-15 DIAGNOSIS — M25561 Pain in right knee: Secondary | ICD-10-CM

## 2023-06-15 DIAGNOSIS — G4719 Other hypersomnia: Secondary | ICD-10-CM | POA: Diagnosis not present

## 2023-06-15 DIAGNOSIS — I10 Essential (primary) hypertension: Secondary | ICD-10-CM

## 2023-06-15 DIAGNOSIS — G8929 Other chronic pain: Secondary | ICD-10-CM

## 2023-06-15 DIAGNOSIS — R519 Headache, unspecified: Secondary | ICD-10-CM

## 2023-06-15 DIAGNOSIS — M26609 Unspecified temporomandibular joint disorder, unspecified side: Secondary | ICD-10-CM

## 2023-06-15 NOTE — Patient Instructions (Signed)
Refer to sleep medicine for evaluation of sleep apnea Follow up with your orthopedist regarding continued right knee pain but also to evaluate right shoulder pain.   Limit use of pain relievers to no more than 2 days out of week to prevent risk of rebound or medication-overuse headache. Follow up in 6 months.

## 2023-06-28 ENCOUNTER — Ambulatory Visit: Payer: 59

## 2023-07-14 ENCOUNTER — Encounter: Payer: Self-pay | Admitting: Neurology

## 2023-08-18 DIAGNOSIS — E119 Type 2 diabetes mellitus without complications: Secondary | ICD-10-CM | POA: Diagnosis not present

## 2023-09-08 ENCOUNTER — Ambulatory Visit (INDEPENDENT_AMBULATORY_CARE_PROVIDER_SITE_OTHER): Payer: 59 | Admitting: Podiatry

## 2023-09-08 ENCOUNTER — Encounter: Payer: Self-pay | Admitting: Podiatry

## 2023-09-08 DIAGNOSIS — M79675 Pain in left toe(s): Secondary | ICD-10-CM

## 2023-09-08 DIAGNOSIS — M79674 Pain in right toe(s): Secondary | ICD-10-CM

## 2023-09-08 DIAGNOSIS — M2042 Other hammer toe(s) (acquired), left foot: Secondary | ICD-10-CM

## 2023-09-08 DIAGNOSIS — L84 Corns and callosities: Secondary | ICD-10-CM

## 2023-09-08 DIAGNOSIS — E1142 Type 2 diabetes mellitus with diabetic polyneuropathy: Secondary | ICD-10-CM

## 2023-09-08 DIAGNOSIS — M2041 Other hammer toe(s) (acquired), right foot: Secondary | ICD-10-CM

## 2023-09-08 DIAGNOSIS — B351 Tinea unguium: Secondary | ICD-10-CM

## 2023-09-08 NOTE — Progress Notes (Signed)
  Subjective:  Patient ID: Katie Woodard, female    DOB: 10-Mar-1966,  MRN: [id number]  Chief Complaint  Patient presents with   RFC    She is here for nail trim and she thinks a callous on the bottom of her feet.     58 y.o. female presents with the above complaint. History confirmed with patient. Patient presenting for diabetic foot exam. Patient does have painful callus on the left foot sub 2nd and sub 5th metatarsal head. She is also presenting with painful thickened, elongated, dystrophic toenails that she is unable to maintain herself.  Patient does have a history of T2DM.  She is requesting evaluation for diabetic shoes.  She is unsure of her last A1c, based on her glucometer readings she believes it is around 9.  Objective:  Physical Exam: warm, good capillary refill, diminished pedal hair growth, atrophic pedal skin. nail exam onychomycosis of the toenails, that are thickened, elongated dystrophic. DP pulses palpable, PT pulses palpable, and protective sensation absent, vibratory sensation absent SWM monofilament exam absent sensation to the level of the midfoot bilaterally Left Foot: Callus subsecond and subfifth metatarsal head with central hyperkeratotic core consistent with porokeratosis.  Pain with palpation of these areas. Thickening and dystrophy of the toenails that are tender on direct dorsal palpation x 5 Right Foot: Thickening and dystrophy of the toenails that are tender on direct dorsal palpation x5 Hammertoe contractures of lesser digits bilaterally. Assessment:   1. DM type 2 with diabetic peripheral neuropathy (HCC)   2. Pre-ulcerative calluses   3. Pain due to onychomycosis of toenails of both feet   4. Acquired hammertoes of both feet       Plan:  Patient was evaluated and treated and all questions answered.  # DM2 with neuropathy # Hammertoes of lesser digits Patient educated on diabetes. Discussed proper diabetic foot care and discussed risks and  complications of disease. Educated patient in depth on reasons to return to the office immediately should he/she discover anything concerning or new on the feet. All questions answered. Discussed proper shoes as well.  -Prescription for diabetic shoes with custom inserts x 3.  Patient would benefit from these due to diabetes with neuropathy, offloading of hammertoes, offloading of preulcerative calluses left foot.   #Hyperkeratotic lesions/pre ulcerative calluses present subsecond metatarsal head left foot All symptomatic hyperkeratoses x 2 separate lesions were safely debrided with a sterile #312 blade to patient's level of comfort without incident. We discussed preventative and palliative care of these lesions including supportive and accommodative shoegear, padding, prefabricated and custom molded accommodative orthoses, use of a pumice stone and lotions/creams daily.  # Pain due to onychomycosis of toenails -Affected nail plates x 10 were debrided in thickness and length using sterile nail nippers without incident. -Mechanical bur used to file nails down thin.   Return in about 3 months (around 12/06/2023) for Diabetic Foot Care.         Barbaraann Share, DPM [facility] / [facility]

## 2023-09-08 NOTE — Patient Instructions (Signed)
Look for urea 40% cream or ointment and apply to the thickened dry skin / calluses. This can be bought over the counter, at a pharmacy or online such as [employer].  Can look for a combination product with salicylic acid.  Alternatively can also scrub the calluses with white vinegar before showering.

## 2023-10-05 DIAGNOSIS — K219 Gastro-esophageal reflux disease without esophagitis: Secondary | ICD-10-CM | POA: Diagnosis not present

## 2023-10-05 DIAGNOSIS — I10 Essential (primary) hypertension: Secondary | ICD-10-CM | POA: Diagnosis not present

## 2023-10-05 DIAGNOSIS — E1165 Type 2 diabetes mellitus with hyperglycemia: Secondary | ICD-10-CM | POA: Diagnosis not present

## 2023-10-05 DIAGNOSIS — D508 Other iron deficiency anemias: Secondary | ICD-10-CM | POA: Diagnosis not present

## 2023-10-05 DIAGNOSIS — Z0001 Encounter for general adult medical examination with abnormal findings: Secondary | ICD-10-CM | POA: Diagnosis not present

## 2023-10-11 ENCOUNTER — Ambulatory Visit: Payer: 59

## 2023-10-11 DIAGNOSIS — E1142 Type 2 diabetes mellitus with diabetic polyneuropathy: Secondary | ICD-10-CM

## 2023-10-11 DIAGNOSIS — L84 Corns and callosities: Secondary | ICD-10-CM

## 2023-10-11 DIAGNOSIS — M2041 Other hammer toe(s) (acquired), right foot: Secondary | ICD-10-CM

## 2023-10-11 DIAGNOSIS — M2141 Flat foot [pes planus] (acquired), right foot: Secondary | ICD-10-CM

## 2023-10-11 NOTE — Progress Notes (Signed)
 Patient presents to the office today for diabetic shoe and insole measuring.  Patient was measured with brannock device to determine size and width for 1 pair of extra depth shoes 3 pair of insoles.   Documentation of medical necessity will be sent to patient's treating diabetic doctor to verify and sign.   [employer] Plan sending for Prior auth   Shoes and insoles will be ordered at that time and patient will be notified for an appointment for fitting when they arrive.   Shoe size (per patient): 8 Shoe choice:   [id number] / [id number] Shoe size ordered: 8WD  Financials signed

## 2023-10-13 DIAGNOSIS — M1990 Unspecified osteoarthritis, unspecified site: Secondary | ICD-10-CM | POA: Diagnosis not present

## 2023-10-13 DIAGNOSIS — M79644 Pain in right finger(s): Secondary | ICD-10-CM | POA: Diagnosis not present

## 2023-10-13 DIAGNOSIS — M654 Radial styloid tenosynovitis [de Quervain]: Secondary | ICD-10-CM | POA: Diagnosis not present

## 2023-10-13 DIAGNOSIS — M79641 Pain in right hand: Secondary | ICD-10-CM | POA: Diagnosis not present

## 2023-10-19 ENCOUNTER — Telehealth: Payer: Self-pay

## 2023-10-19 NOTE — Telephone Encounter (Signed)
 Left vm to schedule diabetic shoe pick up

## 2023-10-21 ENCOUNTER — Telehealth: Payer: Self-pay

## 2023-10-21 NOTE — Telephone Encounter (Signed)
 [employer] approval 10/12/23-04/21/24 reference number [id number]

## 2023-11-18 ENCOUNTER — Emergency Department (HOSPITAL_COMMUNITY)
Admission: EM | Admit: 2023-11-18 | Discharge: 2023-11-18 | Disposition: A | Discharge status: Home / Self Care | Attending: Emergency Medicine | Admitting: Emergency Medicine

## 2023-11-18 ENCOUNTER — Encounter (HOSPITAL_COMMUNITY): Payer: Self-pay | Admitting: Emergency Medicine

## 2023-11-18 ENCOUNTER — Other Ambulatory Visit: Payer: Self-pay

## 2023-11-18 DIAGNOSIS — G43009 Migraine without aura, not intractable, without status migrainosus: Secondary | ICD-10-CM | POA: Diagnosis not present

## 2023-11-18 DIAGNOSIS — E119 Type 2 diabetes mellitus without complications: Secondary | ICD-10-CM | POA: Insufficient documentation

## 2023-11-18 DIAGNOSIS — Z79899 Other long term (current) drug therapy: Secondary | ICD-10-CM | POA: Insufficient documentation

## 2023-11-18 DIAGNOSIS — Z7984 Long term (current) use of oral hypoglycemic drugs: Secondary | ICD-10-CM | POA: Insufficient documentation

## 2023-11-18 DIAGNOSIS — I10 Essential (primary) hypertension: Secondary | ICD-10-CM | POA: Diagnosis not present

## 2023-11-18 DIAGNOSIS — R519 Headache, unspecified: Secondary | ICD-10-CM | POA: Diagnosis present

## 2023-11-18 LAB — CBC
HCT: 41 % (ref 36.0–46.0)
Hemoglobin: 11.9 g/dL — ABNORMAL LOW (ref 12.0–15.0)
MCH: 19.6 pg — ABNORMAL LOW (ref 26.0–34.0)
MCHC: 29 g/dL — ABNORMAL LOW (ref 30.0–36.0)
MCV: 67.4 fL — ABNORMAL LOW (ref 80.0–100.0)
Platelets: 347 10*3/uL (ref 150–400)
RBC: 6.08 MIL/uL — ABNORMAL HIGH (ref 3.87–5.11)
RDW: 19.7 % — ABNORMAL HIGH (ref 11.5–15.5)
WBC: 8.8 10*3/uL (ref 4.0–10.5)
nRBC: 0 % (ref 0.0–0.2)

## 2023-11-18 LAB — COMPREHENSIVE METABOLIC PANEL WITH GFR
ALT: 15 U/L (ref 0–44)
AST: 12 U/L — ABNORMAL LOW (ref 15–41)
Albumin: 3.6 g/dL (ref 3.5–5.0)
Alkaline Phosphatase: 55 U/L (ref 38–126)
Anion gap: 9 (ref 5–15)
BUN: 6 mg/dL (ref 6–20)
CO2: 25 mmol/L (ref 22–32)
Calcium: 8.9 mg/dL (ref 8.9–10.3)
Chloride: 103 mmol/L (ref 98–111)
Creatinine, Ser: 0.54 mg/dL (ref 0.44–1.00)
GFR, Estimated: >60 mL/min (ref >60)
Glucose, Bld: 129 mg/dL — ABNORMAL HIGH (ref 70–99)
Potassium: 3.6 mmol/L (ref 3.5–5.1)
Sodium: 137 mmol/L (ref 135–145)
Total Bilirubin: 0.4 mg/dL (ref 0.0–1.2)
Total Protein: 7.2 g/dL (ref 6.5–8.1)

## 2023-11-18 MED ORDER — SODIUM CHLORIDE 0.9 % IV BOLUS
500.0000 mL | Freq: Once | INTRAVENOUS | Status: AC
  Administered 2023-11-18: 500 mL via INTRAVENOUS

## 2023-11-18 MED ORDER — PROCHLORPERAZINE EDISYLATE 10 MG/2ML IJ SOLN
10.0000 mg | Freq: Once | INTRAMUSCULAR | Status: AC
  Administered 2023-11-18: 10 mg via INTRAVENOUS
  Filled 2023-11-18: qty 2

## 2023-11-18 MED ORDER — DIPHENHYDRAMINE HCL 50 MG/ML IJ SOLN
12.5000 mg | Freq: Once | INTRAMUSCULAR | Status: AC
  Administered 2023-11-18: 12.5 mg via INTRAVENOUS
  Filled 2023-11-18: qty 1

## 2023-11-18 MED ORDER — ACETAMINOPHEN ER 650 MG PO TBCR: 650.0000 mg | EXTENDED_RELEASE_TABLET | Freq: Three times a day (TID) | ORAL | 0 refills | Status: AC | PRN

## 2023-11-18 MED ORDER — HYDROCHLOROTHIAZIDE 25 MG PO TABS: 25.0000 mg | ORAL_TABLET | Freq: Every day | ORAL | 0 refills | Status: DC

## 2023-11-18 MED ORDER — IBUPROFEN 800 MG PO TABS: 800.0000 mg | ORAL_TABLET | Freq: Three times a day (TID) | ORAL | 0 refills | Status: AC

## 2023-11-18 MED ORDER — KETOROLAC TROMETHAMINE 15 MG/ML IJ SOLN
15.0000 mg | Freq: Once | INTRAMUSCULAR | Status: AC
  Administered 2023-11-18: 15 mg via INTRAVENOUS
  Filled 2023-11-18: qty 1

## 2023-11-18 NOTE — ED Triage Notes (Signed)
 Pt reports she woke up w/ a headache and states her blood pressure has been high.  She has been compliant w/ medications but no relief.

## 2023-11-18 NOTE — ED Provider Notes (Signed)
 [place] EMERGENCY DEPARTMENT AT [facility] Provider Note   CSN: [id number] Arrival date & time: 11/18/23  0533     History  Chief Complaint  Patient presents with   Headache   Hypertension    Katie Woodard is a 58 y.o. female.   Headache Hypertension Associated symptoms include headaches.   Patient is a 58 year old female presents the ED today with her husband complaining of a 3-day history of right sided pulsatile headache that has been unabated accompanied with high blood pressure despite blood pressure medications, similar to previous migraines.  Previous medical history of Migraines, diverticulitis, DDD, GERD, microcytic anemia, diabetes, HTN.   Reports having 1 onset of vertigo yesterday with associated nausea.  States that this is not feel as bad as previous migraine headache last year.  Notes to have a upcoming neurology appointment on May 6 for headaches.  However denies fever, visual changes, ear pain, sore throat, cough, congestion, shortness of breath, chest pain, abdominal pain, vomiting, dysuria, hematochezia, melena, lower leg swelling.    Home Medications Prior to Admission medications   Medication Sig Start Date End Date Taking? Authorizing Provider  acetaminophen  (TYLENOL  8 HOUR) 650 MG CR tablet Take 1 tablet (650 mg total) by mouth every 8 (eight) hours as needed for up to 7 days for pain. 11/18/23 11/25/23 Yes Jatavius Ellenwood S, PA-C  hydrochlorothiazide (HYDRODIURIL) 25 MG tablet Take 1 tablet (25 mg total) by mouth daily. 11/18/23 12/18/23 Yes Sena Clouatre S, PA-C  metFORMIN (GLUCOPHAGE) 500 MG tablet Take 500 mg by mouth 2 (two) times daily. 06/01/23  Yes [provider]  valsartan (DIOVAN) 80 MG tablet Take 80 mg by mouth daily. 06/01/23  Yes [provider]  ibuprofen  (ADVIL ) 800 MG tablet Take 1 tablet (800 mg total) by mouth 3 (three) times daily. 11/18/23   Bellagrace Sylvan S, PA-C      Allergies    Amlodipine     Review of  Systems   Review of Systems  Neurological:  Positive for headaches.  All other systems reviewed and are negative.   Physical Exam Updated Vital Signs BP (!) 176/78 (BP Location: Right Arm)   Pulse 75   Temp 98.1 F (36.7 C) (Oral)   Resp 16   SpO2 100%  Physical Exam Vitals and nursing note reviewed.  Constitutional:      General: She is not in acute distress.    Appearance: Normal appearance. She is not ill-appearing.  HENT:     Head: Normocephalic and atraumatic.     Right Ear: Tympanic membrane, ear canal and external ear normal. There is no impacted cerumen.     Left Ear: Tympanic membrane, ear canal and external ear normal. There is no impacted cerumen.     Mouth/Throat:     Mouth: Mucous membranes are moist.     Pharynx: Oropharynx is clear. No oropharyngeal exudate or posterior oropharyngeal erythema.  Eyes:     General: No visual field deficit or scleral icterus.       Right eye: No discharge.        Left eye: No discharge.     Extraocular Movements: Extraocular movements intact.     Right eye: Normal extraocular motion and no nystagmus.     Left eye: Normal extraocular motion and no nystagmus.     Conjunctiva/sclera: Conjunctivae normal.     Pupils: Pupils are equal, round, and reactive to light.     Right eye: Pupil is round and reactive.  Left eye: Pupil is round and reactive.  Cardiovascular:     Rate and Rhythm: Normal rate and regular rhythm.     Pulses: Normal pulses.     Heart sounds: Normal heart sounds. No murmur heard.    No friction rub. No gallop.  Pulmonary:     Effort: Pulmonary effort is normal. No respiratory distress.     Breath sounds: Normal breath sounds. No stridor. No wheezing, rhonchi or rales.  Abdominal:     General: Abdomen is flat.     Palpations: Abdomen is soft.     Tenderness: There is no abdominal tenderness. There is no guarding or rebound.  Musculoskeletal:     Cervical back: Normal range of motion. No rigidity.     Right  lower leg: No edema.     Left lower leg: No edema.  Skin:    General: Skin is warm and dry.     Findings: No bruising or erythema.  Neurological:     General: No focal deficit present.     Mental Status: She is alert and oriented to person, place, and time. Mental status is at baseline.     GCS: GCS eye subscore is 4. GCS verbal subscore is 5. GCS motor subscore is 6.     Cranial Nerves: No cranial nerve deficit or dysarthria.     Sensory: No sensory deficit.     Motor: No weakness.     Coordination: Coordination normal.  Psychiatric:        Mood and Affect: Mood normal. Mood is not anxious or depressed.        Behavior: Behavior is not agitated.        Cognition and Memory: Memory is not impaired.     ED Results / Procedures / Treatments   Labs (all labs ordered are listed, but only abnormal results are displayed) Labs Reviewed  CBC - Abnormal; Notable for the following components:      Result Value   RBC 6.08 (*)    Hemoglobin 11.9 (*)    MCV 67.4 (*)    MCH 19.6 (*)    MCHC 29.0 (*)    RDW 19.7 (*)    All other components within normal limits  COMPREHENSIVE METABOLIC PANEL WITH GFR - Abnormal; Notable for the following components:   Glucose, Bld 129 (*)    AST 12 (*)    All other components within normal limits    EKG None  Radiology No results found.  Procedures Procedures    Medications Ordered in ED Medications  sodium chloride  0.9 % bolus 500 mL (500 mLs Intravenous New Bag/Given 11/18/23 1220)  prochlorperazine  (COMPAZINE ) injection 10 mg (10 mg Intravenous Given 11/18/23 1226)  ketorolac  (TORADOL ) 15 MG/ML injection 15 mg (15 mg Intravenous Given 11/18/23 1223)  diphenhydrAMINE  (BENADRYL ) injection 12.5 mg (12.5 mg Intravenous Given 11/18/23 1226)    ED Course/ Medical Decision Making/ A&P                                 Medical Decision Making Amount and/or Complexity of Data Reviewed Labs: ordered.   This patient is a 58 year old female who  presents to the ED for concern of migraine headache that has been unabated for 3 days associated with intermittent nausea and 1 episode of vertigo.  No other symptoms present at this time.  Headache not as bad as previous but has remained persistent and associated with  elevated blood pressure despite taking losartan.  On physical exam, patient is in no acute distress, afebrile, alert and orient x 4, speaking in full sentences, nontachypneic, nontachycardic.  Normal EOM, PERRL.  LCTAB, RRR, no murmurs, no abdominal pain to palpation.  Normal neuroexam with no facial asymmetry, no ataxia, no apraxia, no aphasia, no arm drift, normal coordination with finger-to-nose, normal sensation to both upper and lower extremities bilaterally, normal grip strength bilaterally, normal strength to both flexion and extension to both upper lower extremities 5+ bilaterally, no visual field deficits, no nystagmus.  Suspect migraine headache versus hypertension as cause of headache.  Will provide migraine cocktail and reevaluate headache and if blood pressure still elevated will provide BP meds. CBC shows a mildly elevated hemoglobin of 11.9 which is near baseline.  CMP unremarkable.  With no signs of endorgan damage, previous history of migraines, normal neuroexam, do not suspect central causes of headache.  On reevaluation, headache has significantly improved and blood pressure is also improved with a systolic of 160.  Will send home with ibuprofen , Tylenol , hydrochlorothiazide as well as have her follow-up with PCP for further blood pressure monitoring and follow-up with neurology for continued headache management.  No need for reduction of BP here in the ED as patient is not at hypertensive urgency/emergency, with headache being controlled at this time and BP improving with relief of headache.  Will have her continue to monitor blood pressures at home.  And to return to the ED for any new or worsening symptoms.  Patient vital  signs have remained stable throughout the course of patient's time in the ED. Low suspicion for any other emergent pathology at this time. I believe this patient is safe to be discharged. Provided strict return to ER precautions. Patient expressed agreement and understanding of plan. All questions were answered.  Differential diagnoses prior to evaluation: The emergent differential diagnosis includes, but is not limited to, differential diagnosis Stroke, increased ICP, meningitis, CVA, intracranial tumor, venous sinus thrombosis, migraine, cluster headache, hypertension, drug related, head injury, tension headache, sinusitis, dental abscess, otitis media, TMJ. This is not an exhaustive differential.   Past Medical History / Co-morbidities / Social History: Migraines, diverticulitis, DDD, GERD, microcytic anemia, diabetes, HTN  Additional history: Chart reviewed. Pertinent results include:  Last seen by neurology on 06/15/2023, migraine to be suspected due to elevated BP or TMJ dysfunction.  Last seen on 10/04/2022 for complicated migraine for which she was given an ambulatory referral to neurology.  Worked up for stroke and PR ES with none noted.  Lab Tests/Imaging studies: I personally interpreted labs/imaging and the pertinent results include:    CBC shows a decreased hemoglobin of 11.9 which is near baseline per previous CMP unremarkable.  Considered CT imaging we do not believe is warranted at this time.   Medications: I ordered medication including Toradol , Compazine , and a drill, NS.  I have reviewed the patients home medicines and have made adjustments as needed.   Disposition: After consideration of the diagnostic results and the patients response to treatment, I feel that the patient would benefit from discharge and treatment as above.   emergency department workup does not suggest an emergent condition requiring admission or immediate intervention beyond what has been performed  at this time. The plan is: Follow-up with PCP for blood pressure management, add hydrochlorothiazide to BP regimen if BP continues to remain elevated, Tylenol  and ibuprofen  for headache management, follow-up with neurology for continued headache management. The patient is safe for  discharge and has been instructed to return immediately for worsening symptoms, change in symptoms or any other concerns.   Final Clinical Impression(s) / ED Diagnoses Final diagnoses:  Migraine without aura and without status migrainosus, not intractable    Rx / DC Orders ED Discharge Orders          Ordered    hydrochlorothiazide (HYDRODIURIL) 25 MG tablet  Daily        11/18/23 1317    acetaminophen  (TYLENOL  8 HOUR) 650 MG CR tablet  Every 8 hours PRN        11/18/23 1317    ibuprofen  (ADVIL ) 800 MG tablet  3 times daily        11/18/23 1317              Hayes Lipps, [place] 11/18/23 1335    Dorenda Gandy, MD 11/21/23 1104

## 2023-11-18 NOTE — ED Notes (Signed)
 Pt understood d/c instructions and when to return to the ED. Medications were taught and understood. IV dc and VS retaken

## 2023-11-18 NOTE — Discharge Instructions (Addendum)
 You were seen today for a migraine headache.  Recommend you continue to follow-up with your PCP for continued blood pressure management I am adding a blood pressure medication of hydrochlorothiazide to your regimen.  Recommend continue to monitor blood pressure at home and if continuing to remain elevated, take hydrochlorothiazide alongside your losartan.  Also recommend that you follow-up with your neurologist for continued headache management.  I am sending in a prescription of Tylenol  and ibuprofen  which are both over-the-counter.    Take Tylenol  (acetominophen)  650mg  every 4-6 hours, as needed for pain or fever. Do not take more than 4,000 mg in a 24-hour period. As this may cause liver damage. While this is rare, if you begin to develop yellowing of the skin or eyes, stop taking and return to ER immediately.  Take Ibuprofen  400mg  every 4-6 hours for pain or fever, not exceeding 3,200 mg per day as more than 3,200mg  can cause Stomach irritation, dizziness, kidney issues with long-term use. Return to the ED for any new or worsening symptoms.  Return to the ED for any new or worsening symptoms including worsening headache, weakness, seizures, visual changes, decreased urination despite fluid intake, fever.

## 2023-11-23 DIAGNOSIS — D508 Other iron deficiency anemias: Secondary | ICD-10-CM | POA: Diagnosis not present

## 2023-11-23 DIAGNOSIS — K219 Gastro-esophageal reflux disease without esophagitis: Secondary | ICD-10-CM | POA: Diagnosis not present

## 2023-11-23 DIAGNOSIS — G43109 Migraine with aura, not intractable, without status migrainosus: Secondary | ICD-10-CM | POA: Diagnosis not present

## 2023-11-23 DIAGNOSIS — I10 Essential (primary) hypertension: Secondary | ICD-10-CM | POA: Diagnosis not present

## 2023-11-23 DIAGNOSIS — E1165 Type 2 diabetes mellitus with hyperglycemia: Secondary | ICD-10-CM | POA: Diagnosis not present

## 2023-11-25 ENCOUNTER — Telehealth: Payer: Self-pay | Admitting: *Deleted

## 2023-11-25 ENCOUNTER — Telehealth: Payer: Self-pay

## 2023-11-25 DIAGNOSIS — I10 Essential (primary) hypertension: Secondary | ICD-10-CM

## 2023-11-25 NOTE — Progress Notes (Unsigned)
 Complex Care Management Note Care Guide Note  11/25/2023 Name: Katie Woodard MRN: [id number] DOB: 11-14-1965   Complex Care Management Outreach Attempts: An unsuccessful telephone outreach was attempted today to offer the patient information about available complex care management services.  Follow Up Plan:  Additional outreach attempts will be made to offer the patient complex care management information and services.   Encounter Outcome:  No Answer  Kandis Ormond, CMA [place]  [facility], [facility] Guide Direct Dial: [phone]  Fax: [phone] Website: [email or web address]

## 2023-11-29 NOTE — Progress Notes (Unsigned)
 NEUROLOGY FOLLOW UP OFFICE NOTE  Katie Woodard [id number]  Assessment/Plan:   Right sided headache.  Unclear etiology.  Possibly isolated migraine which would be diagnosis of exclusion as she has no prior history but it may have been triggered by elevated blood pressure and TMJ dysfunction.  Regardless, no recurrent episodes. *** Morning headaches (in conjunction with excessive daytime sleepiness and history of snoring) raises possibility of obstructive sleep apnea.    Refer to sleep medicine for evaluation of OSA Advised to follow up with her orthopedist regarding ongoing chronic right knee pain as well as right shoulder pain If she should have recurrence of headaches, consider following up with dentist regarding evaluation of TMJ dysfunction and possible mouth guard. Advised to follow up with PCP regarding ongoing uncontrolled hypertension Follow up with me in 6 months.   Subjective:  Katie Woodard is a 58 year old left-handed female with CKD, DM 2, HTN, anemia, and DDD of cervical spine who follows up for headaches.  History supplemented by recent ED note  UPDATE: She did not respond to sleep medicine's request to schedule appointment.  Had a recurrence of habitual right sided headache.  It was associated with elevated blood pressure of ***.  A couple of days later, however, she developed vertigo with nausea.  Went to the ED on 4/25.  BP was 176/78.  Responded to headache cocktail.  ***   Intensity:  *** Duration:  *** Frequency:  *** Frequency of abortive medication: *** Current NSAIDS/analgesics:  ibuprofen  800mg  Current triptans:  none Current ergotamine:  none Current anti-emetic:  none Current muscle relaxants:  none Current Antihypertensive medications:  valsartan Current Antidepressant medications:  none Current Anticonvulsant medications:  none Current anti-CGRP:  none Current Vitamins/Herbal/Supplements:  none Current Antihistamines/Decongestants:   none Other therapy:  none  Caffeine:  *** Alcohol :  *** Smoker:  *** Diet:  *** Exercise:  *** Depression:  ***; Anxiety:  *** Other pain:  *** Sleep hygiene:  ***  HISTORY: Se presented to Urgent Care on 09/25/2022 due to not feeling well.  She started experiencing a severe right temporal headache radiating across the front of her head.  Noted blurred vision in right eye with photophobia.  No nausea, vomiting or phonophobia.  Also endorsed dyspnea, chest discomfort and generalized body aches.  She had recently had changes in her blood pressure medications due to continuing to have uncontrolled high blood pressure.  She was diagnosed with upper viral illness.  Headaches persisted, so she went to the ED on 10/04/2022.  On exam, she was noted to have weakness in the right upper and lower extremities, also numbness as well.  MRI of brain without contrast revealed mild chronic small vessel ischemic changes but no acute abnormalities.  CTA head and neck revealed atherosclerotic plaque within the intracranial ICAs but no LVO or hemodynamically significant stenosis.  CT perfusion negative for acute infarct.  Sed rate 18 and CRP was 1.0.  Diagnosed with complicated migraine.  Denies prior history of migraines or other headaches.  Symptoms resolved over the next 48 hours.  No recurrence of these headaches.  However, she does wake up with a dull left frontal/temporal headache about 3 days a week.  It resolves later in the morning.  She does have disrupted sleep and has excessive daytime sleepiness.  She reports history of snoring.    She has a right knee replacement in 2022.  She continues to have numbness around the knee (lower thigh and below knee).  Hasn't followed by with orthopedics.  She also has right shoulder pain.   Past NSAIDS/analgesics:  acetaminophen , naproxen  Past abortive triptans:  none Past abortive ergotamine:  none Past muscle relaxants:  Robaxin  Past anti-emetic:  Zofran , Reglan  Past  antihypertensive medications:  none Past antidepressant medications:  none Past anticonvulsant medications:  none Past anti-CGRP:  none Other past therapies:  none    PAST MEDICAL HISTORY: Past Medical History:  Diagnosis Date   Abdominal pain, chronic, right lower quadrant 01/31/2014   Chronic kidney disease    mases in kidney   DDD (degenerative disc disease), cervical    DDD (degenerative disc disease), cervical 01/21/2022   Diabetes mellitus without complication (HCC)    Diverticulitis    DIVERTICULITIS OF COLON 02/26/2009   Qualifier: Diagnosis of  By: Gaylyn Keas FNP, [place]     GERD (gastroesophageal reflux disease)    Takes OTC meds   Headache    migraines twice a week   History of blood transfusion    Hypertension    Hypokalemia    Microcytic anemia 11/13/2008   Qualifier: Diagnosis of  By: Gaylyn Keas FNP, [place]     MIGRAINE HEADACHE 11/13/2008   Qualifier: Diagnosis of  By: Gaylyn Keas FNP, [place]     Ovarian cyst    Uterine fibroid     MEDICATIONS: Current Outpatient Medications on File Prior to Visit  Medication Sig Dispense Refill   hydrochlorothiazide  (HYDRODIURIL ) 25 MG tablet Take 1 tablet (25 mg total) by mouth daily. 30 tablet 0   ibuprofen  (ADVIL ) 800 MG tablet Take 1 tablet (800 mg total) by mouth 3 (three) times daily. 21 tablet 0   metFORMIN (GLUCOPHAGE) 500 MG tablet Take 500 mg by mouth 2 (two) times daily.     valsartan (DIOVAN) 80 MG tablet Take 80 mg by mouth daily.     No current facility-administered medications on file prior to visit.    ALLERGIES: Allergies  Allergen Reactions   Amlodipine  Nausea Only    FAMILY HISTORY: Family History  Problem Relation Age of Onset   Migraines Mother    Cancer Mother        breast   Breast cancer Mother        38s and again in her 59s   Cancer Sister        breast x2   Breast cancer Sister        early 75s and again in late 80s   Cancer Maternal Aunt        ovarian   Schizophrenia Son    Lupus  Daughter       Objective:  *** General: No acute distress.  Patient appears ***-groomed.   Head:  Normocephalic/atraumatic Eyes:  Fundi examined but not visualized Neck: supple, no paraspinal tenderness, full range of motion Heart:  Regular rate and rhythm Lungs:  Clear to auscultation bilaterally Back: No paraspinal tenderness Neurological Exam: alert and oriented.  Speech fluent and not dysarthric, language intact.  CN II-XII intact. Bulk and tone normal, muscle strength 5/5 throughout.  Sensation to light touch intact.  Deep tendon reflexes 2+ throughout, toes downgoing.  Finger to nose testing intact.  Gait normal, Romberg negative.   Janne Members, DO  CC: ***

## 2023-11-29 NOTE — Progress Notes (Signed)
 Complex Care Management Note  Care Guide Note 11/29/2023 Name: KAMYRA HASHEM MRN: [id number] DOB: April 02, 1966  THOMASINA HOLLEMAN is a 58 y.o. year old female who sees Osei-Bonsu, Caretha Chapel, MD for primary care. I reached out to Teresa Fender by phone today to offer complex care management services.  Ms. Lal was given information about Complex Care Management services today including:   The Complex Care Management services include support from the care team which includes your Nurse Care Manager, Clinical Social Worker, or Pharmacist.  The Complex Care Management team is here to help remove barriers to the health concerns and goals most important to you. Complex Care Management services are voluntary, and the patient may decline or stop services at any time by request to their care team member.   Complex Care Management Consent Status: Patient agreed to services and verbal consent obtained.   Follow up plan:  Telephone appointment with complex care management team member scheduled for:  12/12/2023  Encounter Outcome:  Patient Scheduled  Kandis Ormond, CMA [place]  [facility], [facility] Guide Direct Dial: [phone]  Fax: [phone] Website: [email or web address]

## 2023-11-29 NOTE — Congregational Nurse Program (Signed)
 CCNP has been following client in two blood pressure clinics and blood pressure has been  high. 09/20/23 161/100, pulse 88, 10/18/23  154/95,pulse 76, taken second time  1 hour later 156/100  had an appointment that pm to see her PCP,encouraged to keep appointment and given blood pressure readings  to share with PCP. Also gave client B/P cuff and log book to take her blood pressure at home. Counseled regarding diabetes and high blood pressure, heart disease and stroke . Client was very receptive to education and understood importance of getting blood pressure meds regulated .  Nurse followed up on 11/15/23 and blood pressure was 157/96 pulse 87, glucose was 173 mg/dl . She complained of blurred vision ,headaches and stated when she took her glucose at home it registered 500 mg. Nurse cautioned client on the need to get A1-C done and medication update. She stated she needed to pay her bill at her PCP to be seen . Nurse recommended since she was having symptoms to seek care the pm on the mobile clinic [facility] and she agreed she would go after work .  Nurse followed u the next week ,client was out on leave . Today nurse sought out client to follow up ,states she didn't go to mobile clinic as referred on 11-15-23 but started feeling bad on 11-18-23 and went to ED and was admitted . Today blood  is better but complaining of dark spots and soreness of fingers ,no swelling of feet or hands . Was put on HCL when seen at ED. She states she went back to her PCP but may consider switching her PCP . Nurse counseled gave her resource information and told her a chart review of her ED visit would be done and CCNP would discuss  any labs done like A1_C and get back with her . Will monitor and follow up  weekly .

## 2023-11-30 ENCOUNTER — Ambulatory Visit: Payer: Medicaid Other | Admitting: Neurology

## 2023-11-30 ENCOUNTER — Encounter: Payer: Self-pay | Admitting: Neurology

## 2023-11-30 VITALS — BP 158/89 | HR 89 | Ht 59.0 in | Wt 217.0 lb

## 2023-11-30 DIAGNOSIS — I10 Essential (primary) hypertension: Secondary | ICD-10-CM

## 2023-11-30 DIAGNOSIS — R519 Headache, unspecified: Secondary | ICD-10-CM | POA: Diagnosis not present

## 2023-11-30 DIAGNOSIS — G4719 Other hypersomnia: Secondary | ICD-10-CM

## 2023-11-30 NOTE — Patient Instructions (Signed)
 Refer to sleep medicine.  Watch out for their calls.  Check voice mail.  If you do not hear from them in one week, contact me Work on keeping blood pressure controlled Follow up 6 months or as needed

## 2023-12-02 ENCOUNTER — Ambulatory Visit (INDEPENDENT_AMBULATORY_CARE_PROVIDER_SITE_OTHER)

## 2023-12-02 DIAGNOSIS — E1142 Type 2 diabetes mellitus with diabetic polyneuropathy: Secondary | ICD-10-CM

## 2023-12-02 DIAGNOSIS — M2042 Other hammer toe(s) (acquired), left foot: Secondary | ICD-10-CM

## 2023-12-02 DIAGNOSIS — M2041 Other hammer toe(s) (acquired), right foot: Secondary | ICD-10-CM

## 2023-12-02 DIAGNOSIS — M2142 Flat foot [pes planus] (acquired), left foot: Secondary | ICD-10-CM

## 2023-12-02 DIAGNOSIS — L84 Corns and callosities: Secondary | ICD-10-CM | POA: Diagnosis not present

## 2023-12-02 DIAGNOSIS — M2141 Flat foot [pes planus] (acquired), right foot: Secondary | ICD-10-CM | POA: Diagnosis not present

## 2023-12-08 ENCOUNTER — Ambulatory Visit: Payer: 59 | Admitting: Podiatry

## 2023-12-12 ENCOUNTER — Other Ambulatory Visit: Payer: Self-pay | Admitting: Licensed Clinical Social Worker

## 2023-12-12 NOTE — Patient Instructions (Signed)
 Visit Information  Ms. Schlotterbeck was given information about Medicaid Managed Care team care coordination services as a part of their Healthy [facility] Medicaid benefit. Teresa Fender verbally consented to engagement with the [facility] Managed Care team.   If you are experiencing a medical emergency, please call 911 or report to your local emergency department or urgent care.   If you have a non-emergency medical problem during routine business hours, please contact your provider's office and ask to speak with a nurse.   For questions related to your Healthy [facility] health plan, please call: [phone] or visit the homepage here: [email or web address]  If you would like to schedule transportation through your Healthy [facility] plan, please call the following number at least 2 days in advance of your appointment: [phone]  For information about your ride after you set it up, call Ride Assist at [phone]. Use this number to activate a Will Call pickup, or if your transportation is late for a scheduled pickup. Use this number, too, if you need to make a change or cancel a previously scheduled reservation.  If you need transportation services right away, call [phone]. The after-hours call center is staffed 24 hours to handle ride assistance and urgent reservation requests (including discharges) 365 days a year. Urgent trips include sick visits, hospital discharge requests and life-sustaining treatment.  Call the [facility] Line at [phone], at any time, 24 hours a day, 7 days a week. If you are in danger or need immediate medical attention call 911.  If you would like help to quit smoking, call 1-800-QUIT-NOW (2033744207) OR Espaol: 1-855-Djelo-Ya (4-742-595-6387) o para ms informacin haga clic aqu or Text READY to 564-332 to register via text  Ms. Ford Ide - following are the goals we discussed in your visit today:    Goals Addressed             This Visit's Progress    LCSW VBCI Social Work Care Plan   On track    Problems:   Disease Management support and education needs related to Depression: anxiety and disturbed sleep  CSW Clinical Goal(s):   Over the next 90 days the Patient will attend all scheduled medical appointments as evidenced by patient report and care team review of appointment completion in electronic MEDICAL RECORD NUMBER  demonstrate a reduction in symptoms related to Depression: anxiety disturbed sleep .  Interventions:  Mental Health:  Evaluation of current treatment plan related to Depression: anxiety and disturbed sleep Active listening / Reflection utilized Emotional Support Provided Mindfulness or Relaxation training provided Provided general psycho-education for mental health needs Quality of sleep assessed & Sleep Hygiene techniques promoted Solution-Focued Strategies employed:  Patient Goals/Self-Care Activities:  Continue taking your medication as prescribed. Discuss adverse side effects of BP medicine (dizziness) to your PCP and Congregational Nurse Increase coping skills and healthy habits  Plan:   Telephone follow up appointment with care management team member scheduled for:  4 weeks        Please see education materials related to topics discussed provided by MyChart link.  Patient verbalizes understanding of instructions and care plan provided today and agrees to view in MyChart. Active MyChart status and patient understanding of how to access instructions and care plan via MyChart confirmed with patient.     Licensed Clinical Social Worker will 01/02/24  Alease Hunter, LCSW [place]  [facility], [facility] Clinical Social Worker Direct Dial: [phone]  Fax: [phone] Website: [email or web address] 11:05 AM  Following is a copy of your plan of care:  There are no care plans that you recently modified to display for  this patient.

## 2023-12-12 NOTE — Patient Outreach (Addendum)
 Complex Care Management   Visit Note  12/12/2023  Name:  Katie Woodard MRN: [id number] DOB: [birth year]  Situation: Referral received for Complex Care Management related to Diabetes with Complications, Mental/Behavioral Health diagnosis Depression, and Grief I obtained verbal consent from Patient.  Visit completed with pt  on the phone  Background:   Past Medical History:  Diagnosis Date   Abdominal pain, chronic, right lower quadrant 01/31/2014   Chronic kidney disease    mases in kidney   DDD (degenerative disc disease), cervical    DDD (degenerative disc disease), cervical 01/21/2022   Diabetes mellitus without complication (HCC)    Diverticulitis    DIVERTICULITIS OF COLON 02/26/2009   Qualifier: Diagnosis of  By: Gaylyn Keas FNP, Nykedtra     GERD (gastroesophageal reflux disease)    Takes OTC meds   Headache    migraines twice a week   History of blood transfusion    Hypertension    Hypokalemia    Microcytic anemia 11/13/2008   Qualifier: Diagnosis of  By: Gaylyn Keas FNP, Davene Ernst     MIGRAINE HEADACHE 11/13/2008   Qualifier: Diagnosis of  By: Gaylyn Keas FNP, Nykedtra     Ovarian cyst    Uterine fibroid     Assessment: Patient reports that she has only nine days left of her blood pressure medication, noting she does not have a refill. LCSW left a detailed message for the nurse, per pt request and faxed a copy of today's note to PCP office, to further advise  Patient Reported Symptoms:   Cognitive Cognitive Status: Alert and oriented to person, place, and time, Normal speech and language skills Cognitive/Intellectual Conditions Management [RPT]: None reported or documented in medical history or problem list   Health Maintenance Behaviors: Spiritual practice(s), Stress management Health Facilitated by: Prayer/meditation, Rest  Neurological Neurological Review of Symptoms: No symptoms reported    HEENT HEENT Symptoms Reported: No symptoms reported      Cardiovascular  Cardiovascular Symptoms Reported: No symptoms reported Does patient have uncontrolled Hypertension?: Yes Is patient checking Blood Pressure at home?: Yes Cardiovascular Conditions: Hypertension  Respiratory Respiratory Symptoms Reported: No symptoms reported    Endocrine Patient reports the following symptoms related to hypoglycemia or hyperglycemia : Headaches Is patient diabetic?: Yes Endocrine Conditions: Diabetes  Gastrointestinal Gastrointestinal Symptoms Reported: No symptoms reported      Genitourinary Genitourinary Symptoms Reported: No symptoms reported    Integumentary Integumentary Symptoms Reported: Other Other Integumentary Symptoms: Pt reports she has a "spot" on the bottom of her left foot. She plans on making an appt with [facility] to assess    Musculoskeletal Musculoskelatal Symptoms Reviewed: Unsteady gait Additional Musculoskeletal Details: Patient reports neuropathy on her right knee, which impacts gait at times Musculoskeletal Conditions: Mobility limited, Unsteady gait Falls in the past year?: No Number of falls in past year: 1 or less Was there an injury with Fall?: No Fall Risk Category Calculator: 0 Patient Fall Risk Level: Low Fall Risk Patient at Risk for Falls Due to: Impaired balance/gait, Impaired mobility  Psychosocial Psychosocial Symptoms Reported: Anxiety - if selected complete GAD Additional Psychological Details: Patient reports increase in anxiety due to feeling "dizzy throughout the day" and the recent loss of sister in law. Healthy coping skills discussed and LCSW will message  provider office to inform of med concerns/need for refill Behavioral Health Conditions: Depression Behavioral Management Strategies: Adequate rest, Coping strategies, Support system Major Change/Loss/Stressor/Fears (CP): Death of a loved one, Medical condition, self Techniques to [employer]  with Loss/Stress/Change: Diversional activities, Spiritual practice(s) Quality  of Family Relationships: helpful, involved, supportive Do you feel physically threatened by others?: No      11/08/2022   10:21 AM  Depression screen PHQ 2/9  Decreased Interest 0  Down, Depressed, Hopeless 0  PHQ - 2 Score 0    There were no vitals filed for this visit.  Medications Reviewed Today     Reviewed by Adriana Albany, LCSW (Social Worker) on 12/12/23 at 1034  Med List Status: <None>   Medication Order Taking? Sig Documenting Provider Last Dose Status Informant  hydrochlorothiazide  (HYDRODIURIL ) 25 MG tablet [id number] Yes Take 1 tablet (25 mg total) by mouth daily. Bauer, Collin S, PA-C Taking Active   ibuprofen  (ADVIL ) 800 MG tablet [id number] Yes Take 1 tablet (800 mg total) by mouth 3 (three) times daily. Bauer, Collin S, PA-C Taking Active   linaclotide Glory Larsen) 145 MCG CAPS capsule [id number] Yes Take 145 mcg by mouth as needed. [provider] Taking Active   metFORMIN (GLUCOPHAGE) 500 MG tablet [id number] Yes Take 500 mg by mouth 2 (two) times daily. [provider] Taking Active Self, Pharmacy Records  valsartan (DIOVAN) 80 MG tablet [id number] Yes Take 80 mg by mouth daily. [provider] Taking Active Self, Pharmacy Records            Recommendation:   PCP Follow-up  Follow Up Plan:   Telephone follow-up in 1 month  Alease Hunter, LCSW [facility]  [facility], [facility] Clinical Social Worker Direct Dial: [phone]  Fax: [phone] Website: [email or web address] 11:04 AM

## 2023-12-15 ENCOUNTER — Ambulatory Visit: Payer: Medicaid Other | Admitting: Neurology

## 2023-12-22 ENCOUNTER — Encounter: Payer: Self-pay | Admitting: Podiatry

## 2023-12-22 ENCOUNTER — Ambulatory Visit: Admitting: Podiatry

## 2023-12-22 ENCOUNTER — Ambulatory Visit (INDEPENDENT_AMBULATORY_CARE_PROVIDER_SITE_OTHER)

## 2023-12-22 DIAGNOSIS — M2042 Other hammer toe(s) (acquired), left foot: Secondary | ICD-10-CM

## 2023-12-22 DIAGNOSIS — M2142 Flat foot [pes planus] (acquired), left foot: Secondary | ICD-10-CM | POA: Diagnosis not present

## 2023-12-22 DIAGNOSIS — M2041 Other hammer toe(s) (acquired), right foot: Secondary | ICD-10-CM | POA: Diagnosis not present

## 2023-12-22 DIAGNOSIS — M722 Plantar fascial fibromatosis: Secondary | ICD-10-CM

## 2023-12-22 DIAGNOSIS — L84 Corns and callosities: Secondary | ICD-10-CM | POA: Diagnosis not present

## 2023-12-22 DIAGNOSIS — E1142 Type 2 diabetes mellitus with diabetic polyneuropathy: Secondary | ICD-10-CM | POA: Diagnosis not present

## 2023-12-22 DIAGNOSIS — M2141 Flat foot [pes planus] (acquired), right foot: Secondary | ICD-10-CM

## 2023-12-22 MED ORDER — MELOXICAM 15 MG PO TABS: 15.0000 mg | ORAL_TABLET | Freq: Every day | ORAL | 0 refills | Status: DC

## 2023-12-22 MED ORDER — TRIAMCINOLONE ACETONIDE 10 MG/ML IJ SUSP
10.0000 mg | Freq: Once | INTRAMUSCULAR | Status: AC
Start: 2023-12-22 — End: 2023-12-22
  Administered 2023-12-22: 10 mg

## 2023-12-22 NOTE — Congregational Nurse Program (Signed)
 Client was screened on 12-20-23 on site at [facility]. Client in very good spirits ,states she feels so much better medication she has become adjusted to and taking at night but it gets her up several times per night ,recommended she take in am so she can rest better at night . States dizziness occurs only when she gets up in am and is sitting by the bedside a few minutes before getting up.seen by her neurologist last week and he will wait until she is seen by sleep apnea clinic before he treats for dizziness as lack of sleep may be underlying problem Will see PCP on 01-18-24  to review medication with the inclusion of HCL. No swelling of hands or feet. Blood pressure today has dropped tremendously since the start of HCL   today 122/80 pulse 84   glucose 130 mg/dl non fasting.  CCNP counseled on diabetes and when to check her blood sugars. Will continue to follow

## 2023-12-22 NOTE — Congregational Nurse Program (Signed)
  Dept: [phone]   Congregational Nurse Program Note  Date of Encounter: 12/22/2023  Past Medical History: Past Medical History:  Diagnosis Date   Abdominal pain, chronic, right lower quadrant 01/31/2014   Chronic kidney disease    mases in kidney   DDD (degenerative disc disease), cervical    DDD (degenerative disc disease), cervical 01/21/2022   Diabetes mellitus without complication (HCC)    Diverticulitis    DIVERTICULITIS OF COLON 02/26/2009   Qualifier: Diagnosis of  By: Gaylyn Keas FNP, Nykedtra     GERD (gastroesophageal reflux disease)    Takes OTC meds   Headache    migraines twice a week   History of blood transfusion    Hypertension    Hypokalemia    Microcytic anemia 11/13/2008   Qualifier: Diagnosis of  By: Gaylyn Keas FNP, Davene Ernst     MIGRAINE HEADACHE 11/13/2008   Qualifier: Diagnosis of  By: Gaylyn Keas FNP, Davene Ernst     Ovarian cyst    Uterine fibroid     Encounter Details:  Community Questionnaire - 12/22/23 1921       Questionnaire   Ask client: Do you give verbal consent for me to treat you today? Yes    Student Assistance N/A    Location Patient Information systems manager, [employer]    Population Status Unknown    Insurance Medicaid    Insurance/Financial Assistance Referral N/A    Medication Patient Medications Reviewed    Medical Provider Yes    Screening Referrals Made N/A    Medical Referrals Made N/A    Medical Appointment Completed N/A    Screenings CN Performed Blood Pressure;Blood Glucose    ED Visit Averted N/A    Life-Saving Intervention Made N/A

## 2023-12-22 NOTE — Progress Notes (Signed)
 .   Chief Complaint  Patient presents with   Foot Pain    Left foot plantar foot pain x 2 weeks. Sharp pain. Taken 800 mg ibuprofen . NIDDM A1C 5.9.    HPI: 57 y.o. female presenting today with c/o pain in the bottom of the left heel.  She reports shooting stabbing pains for the last 2 weeks.  Denies any trauma.  Patient is diabetic.  She does have painful, thickened, elongated toenails.  She does have 2 calluses to the bottom of the left forefoot as well.  She endorses history of right knee replacement about 1 year ago which does cause some altered gait.  Past Medical History:  Diagnosis Date   Abdominal pain, chronic, right lower quadrant 01/31/2014   Chronic kidney disease    mases in kidney   DDD (degenerative disc disease), cervical    DDD (degenerative disc disease), cervical 01/21/2022   Diabetes mellitus without complication (HCC)    Diverticulitis    DIVERTICULITIS OF COLON 02/26/2009   Qualifier: Diagnosis of  By: Gaylyn Keas FNP, Nykedtra     GERD (gastroesophageal reflux disease)    Takes OTC meds   Headache    migraines twice a week   History of blood transfusion    Hypertension    Hypokalemia    Microcytic anemia 11/13/2008   Qualifier: Diagnosis of  By: Gaylyn Keas FNP, Davene Ernst     MIGRAINE HEADACHE 11/13/2008   Qualifier: Diagnosis of  By: Gaylyn Keas FNP, Nykedtra     Ovarian cyst    Uterine fibroid    Past Surgical History:  Procedure Laterality Date   APPENDECTOMY     CESAREAN SECTION  1988   4   CHOLECYSTECTOMY  2005   [facility]   COLON SURGERY  01/2020   FLEXIBLE SIGMOIDOSCOPY N/A 02/13/2020   Procedure: FLEXIBLE SIGMOIDOSCOPY;  Surgeon: Melvenia Stabs, MD;  Location: [facility] ORS;  Service: General;  Laterality: N/A;   KNEE ARTHROPLASTY Right 09/10/2020   Procedure: COMPUTER ASSISTED TOTAL KNEE ARTHROPLASTY;  Surgeon: Adonica Hoose, MD;  Location: [facility] ORS;  Service: Orthopedics;  Laterality: Right;  3E   LAPAROSCOPIC APPENDECTOMY N/A 01/31/2014   Procedure:  APPENDECTOMY LAPAROSCOPIC;  Surgeon: Diantha Fossa, MD;  Location: [facility] OR;  Service: General;  Laterality: N/A;   Allergies  Allergen Reactions   Amlodipine  Nausea Only     Physical Exam: General: The patient is alert and oriented x3 in no acute distress.  Dermatology:  No ecchymosis, erythema, or edema bilateral.  No open lesions.  Painful preulcerative callus present left subsecond and subfifth metatarsal head.  Nail plates x 10 are thickened, elongated, dystrophic.  Pedal skin atrophic.  Vascular: Palpable pedal pulses bilaterally. Capillary refill within normal limits.  No appreciable edema.  Diminished pedal hair growth.  Neurological: Epicritic sensation is intact.  Protective sensation decreased.  Vibratory sensation decreased.  Left lower extremity positive Tinel's sign.  Musculoskeletal Exam:  There is pain on palpation of the plantarmedial & plantarcentral aspect of left heel.  No gaps or nodules within the plantar fascia.  Positive Windlass mechanism bilateral.  Antalgic gait noted with first few steps upon standing.  No pain on palpation of achilles tendon bilateral.  Ankle df less than 10 degrees with knee extended b/l.  Left foot pes planus foot type with abducted forefoot.  Right side weakness secondary to knee replacement last year.  Radiographic Exam: Left foot radiographs 12/22/2023 3 views weightbearing Normal osseous mineralization. Joint spaces preserved.  No fractures noted.  Calcaneal spurring noted.  Pes planus foot type noted with abducted forefoot, increased cuboid abduction angle.  Assessment/Plan of Care: 1. Plantar fasciitis of left foot   2. DM type 2 with diabetic peripheral neuropathy (HCC)   3. Pre-ulcerative calluses     Meds ordered this encounter  Medications   triamcinolone  acetonide (KENALOG ) 10 MG/ML injection 10 mg   meloxicam  (MOBIC ) 15 MG tablet    Sig: Take 1 tablet (15 mg total) by mouth daily.    Dispense:  30 tablet    Refill:  0   DG FOOT  COMPLETE LEFT  #Left foot plantar fasciitis -Reviewed etiology of plantar fasciitis with patient.  Radiographs reviewed with patient. Discussed treatment options with patient today, including cortisone injection, NSAID course of treatment, stretching exercises, physical therapy, use of night splint, rest, icing the heel, arch supports/orthotics, and supportive shoe gear.  Does have some tarsal tunnel symptoms as well with positive Tinel's sign.  Believe supportive shoe gear and course of NSAIDs will be beneficial for this.  Compensatory gait from right sided knee replacement may also be a factor.  -Prescribing course of oral meloxicam   -With the patient's verbal consent, a corticosteroid injection was administered to the left heel, consisting of a mixture of 1% lidocaine  plain, 0.5% Sensorcaine  plain, and Kenalog -10 for a total of 2.0 cc administered.  A Band-aid was applied.  -Recommended over-the-counter night splint to patient  -Plantar fascia ankle gauntlet elastic brace dispensed to patient.  Prefabricated device to offload and alleviate tension off of plantar fascia medial arch when ambulatory.  -Stretching exercises discussed at length with patient with written instructions dispensed  # Preulcerative callus left subsecond and subfifth metatarsal head All symptomatic hyperkeratoses were safely debrided with a sterile #312 blade to patient's level of comfort without incident. We discussed preventative and palliative care of these lesions including supportive and accommodative shoegear, padding, prefabricated and custom molded accommodative orthoses, use of a pumice stone and lotions/creams daily.  # Diabetes with neuropathy Patient educated on diabetes. Discussed proper diabetic foot care and discussed risks and complications of disease. Educated patient in depth on reasons to return to the office immediately should he/she discover anything concerning or new on the feet. All questions  answered. Discussed proper shoes as well.  - Has received diabetic shoes since last visit - Plan for nail trim and follow-up    Return in about 2 weeks (around 01/05/2024) for Plantar Fasciitis/nail trim.   Amita Atayde L. Lunda Salines, AACFAS [facility]     [address] [place], [place] [address]                Office [phone]  Fax [phone]

## 2023-12-22 NOTE — Patient Instructions (Signed)

## 2024-01-02 ENCOUNTER — Other Ambulatory Visit: Payer: Self-pay | Admitting: Licensed Clinical Social Worker

## 2024-01-03 NOTE — Congregational Nurse Program (Signed)
 Office visit today ,client states she has had some vaginal bleeding and has not had a period in years . States there was some cramping and blood was in clots and dark in color .Stat CCP urged her to seek medical care to determine source and is sure it is vaginal and not rectal. . CCNP urged her to especially seek care if it happens again at that time but to still follow up to determine if there is a cause for the bleeding . Not on any hormones ,Medications causing bleeding ??,does have history of diverticulosis and renal cyst . Information given from medline plus on vaginal bleeding ,and menopause ,highlighted information to review and ask questions ,with encouragement to seek care from her PCP. States she has called to see when she can be seen by PCP waiting on call but will act if she doesn't get a response in the next day.Client also has low HGB and was counseled regarding loss of blood in relation to low HGB. Client states she will follow up . No other symptoms except a slight headache ,not sleeping well,sleep apnea studies to be done in July. CCNP will continue to follow.

## 2024-01-05 NOTE — Patient Instructions (Signed)
 Visit Information  Thank you for taking time to visit with me today. Please don't hesitate to contact me if I can be of assistance to you before our next scheduled appointment.  Your next care management appointment is by telephone on 06/23 at 2 PM  Please call the care guide team at [phone] if you need to cancel, schedule, or reschedule an appointment.   Please call the Suicide and Crisis Lifeline: 988 go to [facility] [address] [place] [phone]) call 911 if you are experiencing a Mental Health or Behavioral Health Crisis or need someone to talk to.  Alease Hunter, LCSW [place]  [facility], [facility] Clinical Social Worker Direct Dial: [phone]  Fax: [phone] Website: [email or web address] 9:45 AM

## 2024-01-05 NOTE — Patient Outreach (Signed)
 Complex Care Management   Visit Note  01/02/2024  Name:  Katie Woodard MRN: [id number] DOB: 18-Apr-1966  Situation: Referral received for Complex Care Management related to Mental/Behavioral Health diagnosis Depression/Grief I obtained verbal consent from Patient.  Visit completed with pt  on the phone  Background:   Past Medical History:  Diagnosis Date   Abdominal pain, chronic, right lower quadrant 01/31/2014   Chronic kidney disease    mases in kidney   DDD (degenerative disc disease), cervical    DDD (degenerative disc disease), cervical 01/21/2022   Diabetes mellitus without complication (HCC)    Diverticulitis    DIVERTICULITIS OF COLON 02/26/2009   Qualifier: Diagnosis of  By: Gaylyn Keas FNP, Nykedtra     GERD (gastroesophageal reflux disease)    Takes OTC meds   Headache    migraines twice a week   History of blood transfusion    Hypertension    Hypokalemia    Microcytic anemia 11/13/2008   Qualifier: Diagnosis of  By: Gaylyn Keas FNP, Nykedtra     MIGRAINE HEADACHE 11/13/2008   Qualifier: Diagnosis of  By: Gaylyn Keas FNP, Nykedtra     Ovarian cyst    Uterine fibroid     Assessment: Patient Reported Symptoms:  Cognitive Cognitive Status: Alert and oriented to person, place, and time, Normal speech and language skills Cognitive/Intellectual Conditions Management [RPT]: None reported or documented in medical history or problem list   Health Maintenance Behaviors: Spiritual practice(s), Stress management  Neurological Neurological Review of Symptoms: No symptoms reported    HEENT HEENT Symptoms Reported: No symptoms reported      Cardiovascular Cardiovascular Symptoms Reported: No symptoms reported Cardiovascular Conditions: Hypertension Cardiovascular Management Strategies: Medication therapy, Routine screening  Respiratory Respiratory Symptoms Reported: No symptoms reported    Endocrine Patient reports the following symptoms related to hypoglycemia or hyperglycemia :  Not assessed Endocrine Conditions: Diabetes  Gastrointestinal Gastrointestinal Symptoms Reported: Cramping Additional Gastrointestinal Details: Pt reports stomach cramping and vaginal spotting that increased to bleeding. Reports not having a menstraul cycle in ten years. Pt has left a message for PCP/Triage Nurse and agreed to follow up if call is not returned Gastrointestinal Conditions: Bleeding (Vaginal)    Genitourinary Genitourinary Symptoms Reported: No symptoms reported    Integumentary Integumentary Symptoms Reported: Not assessed    Musculoskeletal          Psychosocial Psychosocial Symptoms Reported: Other Other Psychosocial Conditions: Stress Additional Psychological Details: Patient reports that her blood pressure is doing well noting Congregational Nurse, Wallene Gum, has reviewed her meds and BP recently. Pt is tracking her BP and noticed a decrease, will f/up with PCP. She is practicing gratitude thinking and celebrating grandson's graduation. Continues to utilize healthy coping skills Behavioral Management Strategies: Adequate rest, Coping strategies, Medication therapy, Support system Major Change/Loss/Stressor/Fears (CP): Medical condition, self, Death of a loved one Techniques to [place] with Loss/Stress/Change: Diversional activities, Spiritual practice(s) Quality of Family Relationships: helpful, involved, supportive      01/03/2024    1:14 PM  Depression screen PHQ 2/9  Decreased Interest 0  Down, Depressed, Hopeless 0  PHQ - 2 Score 0    There were no vitals filed for this visit.  Medications Reviewed Today     Reviewed by Jolleen Seman D, LCSW (Social Worker) on 01/05/24 at 0932  Med List Status: <None>   Medication Order Taking? Sig Documenting Provider Last Dose Status Informant  hydrochlorothiazide  (HYDRODIURIL ) 25 MG tablet [id number] No Take 1 tablet (25 mg total) by mouth daily. N[address],  Collin S, PA-C Taking Expired 12/18/23 2359   ibuprofen  (ADVIL ) 800 MG  tablet [id number] No Take 1 tablet (800 mg total) by mouth 3 (three) times daily. Bauer, Collin S, PA-C Taking Active   linaclotide Glory Larsen) 145 MCG CAPS capsule [id number] No Take 145 mcg by mouth as needed. [provider] Taking Active   meloxicam  (MOBIC ) 15 MG tablet [id number]  Take 1 tablet (15 mg total) by mouth daily. Reina Cara, DPM  Active   metformin (FORTAMET) 500 MG (OSM) 24 hr tablet [id number] No metFORMIN HCl [provider] Taking Active   metFORMIN (GLUCOPHAGE) 500 MG tablet [id number] No Take 500 mg by mouth 2 (two) times daily. [provider] Taking Active Self, Pharmacy Records  valsartan (DIOVAN) 80 MG tablet [id number] No Take 80 mg by mouth daily. [provider] Taking Active Self, Pharmacy Records            Recommendation:   Acute PCP follow-up Pt agreed to visit PCP office if she doesn't hear back from medical staff in 24 hrs.  Follow Up Plan:   Telephone follow-up 2-4 weeks  Alease Hunter, LCSW [place]  [facility], [facility] Clinical Social Worker Direct Dial: [phone]  Fax: [phone] Website: [email or web address] 9:43 AM

## 2024-01-06 ENCOUNTER — Encounter: Payer: Self-pay | Admitting: Podiatry

## 2024-01-06 ENCOUNTER — Ambulatory Visit (INDEPENDENT_AMBULATORY_CARE_PROVIDER_SITE_OTHER): Admitting: Podiatry

## 2024-01-06 DIAGNOSIS — M722 Plantar fascial fibromatosis: Secondary | ICD-10-CM | POA: Diagnosis not present

## 2024-01-06 DIAGNOSIS — M79674 Pain in right toe(s): Secondary | ICD-10-CM | POA: Diagnosis not present

## 2024-01-06 DIAGNOSIS — B351 Tinea unguium: Secondary | ICD-10-CM

## 2024-01-06 DIAGNOSIS — M79675 Pain in left toe(s): Secondary | ICD-10-CM

## 2024-01-06 DIAGNOSIS — E1142 Type 2 diabetes mellitus with diabetic polyneuropathy: Secondary | ICD-10-CM

## 2024-01-06 NOTE — Progress Notes (Unsigned)
  Subjective:  Patient ID: Katie Woodard, female    DOB: 1966-04-23,  MRN: [id number]  Chief Complaint  Patient presents with   RFC    She is here for  a nail trim,   Plantar Fasciitis    She is doing better,     58 y.o. female presents with the above complaint. History confirmed with patient. Patient presenting with pain related to dystrophic thickened elongated nails. Patient is unable to trim own nails related to nail dystrophy. Patient does have a history of T2DM well controlled. She was recently seen for plantar fasciitis of left foot, she is doing great with this ambulating with the brace.  Objective:  Physical Exam: warm, good capillary refill nail exam onychomycosis of the toenails, onycholysis, and dystrophic nails DP pulses palpable, PT pulses palpable, protective sensation absent, and vibratory sensation diminished Left Foot:  Pain with palpation of nails due to elongation and dystrophic growth.  Left heel has significant pain on palpation of plantar medial and plantar central aspect Right Foot: Pain with palpation of nails due to elongation and dystrophic growth.  Bilateral pes planus foot type noted.  Assessment:   1. Pain due to onychomycosis of toenails of both feet   2. Plantar fasciitis of left foot   3. DM type 2 with diabetic peripheral neuropathy (HCC)      Plan:  Patient was evaluated and treated and all questions answered.  #Onychomycosis with pain  -Nails palliatively debrided as below. -Educated on self-care  Procedure: Nail Debridement Rationale: Pain Type of Debridement: manual, sharp debridement. Instrumentation: Nail nipper, rotary burr. Number of Nails: 10  # Plan fasciitis left heel - Significantly improved from previous -Corticosteroid injection offered due to some residual mild tenderness, patient deferred. -Continue to utilize the plantar fascia brace over the next couple weeks and may slowly wean out of it into regular supportive  shoes -Did recently receive diabetic shoes.  Did discuss good quality shoes as well -Continue home stretching regimen -Continue oral meloxicam  -Follow-up as needed for this. -Did recommend the use of a home night splint device -I certify that this diagnosis represents a distinct and separate diagnosis that requires evaluation and treatment separate from other procedures or diagnosis   # Diabetes with neuropathy Patient educated on diabetes. Discussed proper diabetic foot care and discussed risks and complications of disease. Educated patient in depth on reasons to return to the office immediately should he/she discover anything concerning or new on the feet. All questions answered. Discussed proper shoes as well.   Return in about 3 months (around 04/07/2024).         Eve Hinders, DPM [facility] / [facility]

## 2024-01-09 ENCOUNTER — Encounter: Payer: Self-pay | Admitting: Podiatry

## 2024-01-16 ENCOUNTER — Telehealth: Payer: Self-pay | Admitting: Licensed Clinical Social Worker

## 2024-01-16 ENCOUNTER — Encounter: Payer: Self-pay | Admitting: Licensed Clinical Social Worker

## 2024-01-18 NOTE — Congregational Nurse Program (Signed)
  Dept: [phone]   Congregational Nurse Program Note  Date of Encounter: 01/18/2024  Past Medical History: Past Medical History:  Diagnosis Date   Abdominal pain, chronic, right lower quadrant 01/31/2014   Chronic kidney disease    mases in kidney   DDD (degenerative disc disease), cervical    DDD (degenerative disc disease), cervical 01/21/2022   Diabetes mellitus without complication (HCC)    Diverticulitis    DIVERTICULITIS OF COLON 02/26/2009   Qualifier: Diagnosis of  By: Gladis FNP, Nykedtra     GERD (gastroesophageal reflux disease)    Takes OTC meds   Headache    migraines twice a week   History of blood transfusion    Hypertension    Hypokalemia    Microcytic anemia 11/13/2008   Qualifier: Diagnosis of  By: Gladis FNP, Delorise     MIGRAINE HEADACHE 11/13/2008   Qualifier: Diagnosis of  By: Gladis FNP, Nykedtra     Ovarian cyst    Uterine fibroid     Encounter Details:

## 2024-01-18 NOTE — Congregational Nurse Program (Signed)
 CCNP office visit on 01-17-24@ [employer]  States she did not receive a call from her PCP regarding her break through bleeding episode but not experienced any more symptoms and fells fairly well . Started taking her Hydrodiuril  25 mg  during the am and that works better that she isn't getting up so much at night and can sleep somewhat better.. 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 Client can still come up to [employer] and have nurse check her blood pressure. Blood pressure and glucose much improved since CCNP stared working with client ,encouraged her to keep check on both and decide on her next steps with her PCP.

## 2024-01-19 NOTE — Progress Notes (Signed)

## 2024-01-20 ENCOUNTER — Other Ambulatory Visit: Payer: Self-pay | Admitting: Podiatry

## 2024-01-20 DIAGNOSIS — M722 Plantar fascial fibromatosis: Secondary | ICD-10-CM

## 2024-01-24 ENCOUNTER — Telehealth: Payer: Self-pay | Admitting: *Deleted

## 2024-01-24 NOTE — Progress Notes (Unsigned)
 Complex Care Management Care Guide Note  01/24/2024 Name: Katie Woodard MRN: [id number] DOB: 09/29/1965  Katie Woodard is a 58 y.o. year old female who is a primary care patient of Osei-Bonsu, Zachary, MD and is actively engaged with the care management team. I reached out to [name] by phone today to assist with re-scheduling  with the Licensed Clinical Child psychotherapist.  Follow up plan: Unsuccessful telephone outreach attempt made. A HIPAA compliant phone message was left for the patient providing contact information and requesting a return call.  Thedford Franks, CMA [place]  [facility], [place] [facility] Guide Direct Dial: [phone]  Fax: [phone] Website: [email or web address]

## 2024-01-26 NOTE — Progress Notes (Signed)
 Complex Care Management Care Guide Note  01/26/2024 Name: Katie Woodard MRN: [id number] DOB: [birth year]  Katie Woodard is a 58 y.o. year old female who is a primary care patient of Osei-Bonsu, Zachary, MD and is actively engaged with the care management team. I reached out to Katie Woodard by phone today to assist with re-scheduling  with the Licensed Clinical Child psychotherapist.  Follow up plan: Unsuccessful telephone outreach attempt made. A HIPAA compliant phone message was left for the patient providing contact information and requesting a return call. No further outreach attempts will be made due to inability to maintain patient contact.   Thedford Franks, CMA [place]  [facility], [facility] Guide Direct Dial: [phone]  Fax: [phone] Website: [email or web address]

## 2024-02-21 ENCOUNTER — Telehealth: Payer: Self-pay

## 2024-02-21 NOTE — Telephone Encounter (Signed)
 TC made to check on client 02-14-24 left a message to call nurse.

## 2024-02-22 NOTE — Progress Notes (Deleted)
 02/23/24- 58 yoF for sleep evaluation courtesy of Dr [facility] with concern of morning headache, possible OSA Medical hx includes HTN, Obesity, GERD,  Epworth score- Body weight today  Past Medical History:  Diagnosis Date   Abdominal pain, chronic, right lower quadrant 01/31/2014   Chronic kidney disease    mases in kidney   DDD (degenerative disc disease), cervical    DDD (degenerative disc disease), cervical 01/21/2022   Diabetes mellitus without complication (HCC)    Diverticulitis    DIVERTICULITIS OF COLON 02/26/2009   Qualifier: Diagnosis of  By: Gladis FNP, Nykedtra     GERD (gastroesophageal reflux disease)    Takes OTC meds   Headache    migraines twice a week   History of blood transfusion    Hypertension    Hypokalemia    Microcytic anemia 11/13/2008   Qualifier: Diagnosis of  By: Gladis FNP, Delorise     MIGRAINE HEADACHE 11/13/2008   Qualifier: Diagnosis of  By: Gladis FNP, Nykedtra     Ovarian cyst    Uterine fibroid

## 2024-02-23 ENCOUNTER — Ambulatory Visit: Admitting: Internal Medicine

## 2024-03-30 ENCOUNTER — Telehealth: Admitting: Family Medicine

## 2024-03-30 DIAGNOSIS — T7849XA Other allergy, initial encounter: Secondary | ICD-10-CM | POA: Diagnosis not present

## 2024-03-30 MED ORDER — PREDNISONE 20 MG PO TABS
20.0000 mg | ORAL_TABLET | Freq: Two times a day (BID) | ORAL | 0 refills | Status: AC
Start: 2024-03-30 — End: 2024-04-04

## 2024-03-30 NOTE — Patient Instructions (Signed)
 Contact Dermatitis Dermatitis is redness, soreness, and swelling (inflammation) of the skin. Contact dermatitis is a reaction to certain substances that touch the skin. There are two types of this condition: Irritant contact dermatitis. This is the most common type. It happens when something irritates your skin, such as when your hands get dry from washing them too often with soap. You can get this type of reaction even if you have not been exposed to the irritant before. Allergic contact dermatitis. This type is caused by a substance that you are allergic to, such as poison ivy. It occurs when you have been exposed to the substance (allergen) and form a sensitivity to it. In some cases, the reaction may start soon after your first exposure to the allergen. In other cases, it may not start until you are exposed to the allergen again. It may then occur every time you are exposed to the allergen in the future. What are the causes? Irritant contact dermatitis is often caused by exposure to: Makeup. Soaps, detergents, and bleaches. Acids. Metal salts, such as nickel. Allergic contact dermatitis is often caused by exposure to: Poisonous plants. Chemicals. Jewelry. Latex. Medicines. Preservatives in products, such as clothes. What increases the risk? You are more likely to get this condition if you have: A job that exposes you to irritants or allergens. Certain medical conditions. These include asthma and eczema. What are the signs or symptoms? Symptoms of this condition may occur in any place on your body that has been touched by the irritant. Symptoms include: Dryness, flaking, or cracking. Redness. Itching. Pain or a burning feeling. Blisters. Drainage of small amounts of blood or clear fluid from skin cracks. With allergic contact dermatitis, there may also be swelling in areas such as the eyelids, mouth, or genitals. How is this diagnosed? This condition is diagnosed with a medical  history and physical exam. A patch skin test may be done to help figure out the cause. If the condition is related to your job, you may need to see an expert in health problems in the workplace (occupational medicine specialist). How is this treated? This condition is treated by staying away from the cause of the reaction and protecting your skin from further contact. Treatment may also include: Steroid creams or ointments. Steroid medicines may need be taken by mouth (orally) in more severe cases. Antibiotics or medicines applied to the skin to kill bacteria (antibacterial ointments). These may be needed if a skin infection is present. Antihistamines. These may be taken orally or put on as a lotion to ease itching. A bandage (dressing). Follow these instructions at home: Skin care Moisturize your skin as needed. Put cool, wet cloths (cool compresses) on the affected areas. Try applying baking soda paste to your skin. Stir water into baking soda until it has the consistency of a paste. Do not scratch your skin. Avoid friction to the affected area. Avoid the use of soaps, perfumes, and dyes. Check the affected areas every day for signs of infection. Check for: More redness, swelling, or pain. More fluid or blood. Warmth. Pus or a bad smell. Medicines Take or apply over-the-counter and prescription medicines only as told by your health care provider. If you were prescribed antibiotics, take or apply them as told by your health care provider. Do not stop using the antibiotic even if you start to feel better. Bathing Try taking a bath with: Epsom salts. Follow the instructions on the packaging. You can get these at your local pharmacy  or grocery store. Baking soda. Pour a small amount into the bath as told by your health care provider. Colloidal oatmeal. Follow the instructions on the packaging. You can get this at your local pharmacy or grocery store. Bathe less often. This may mean bathing  every other day. Bathe in lukewarm water. Avoid using hot water. Bandage care If you were given a dressing, change it as told by your health care provider. Wash your hands with soap and water for at least 20 seconds before and after you change your dressing. If soap and water are not available, use hand sanitizer. General instructions Avoid the substance that caused your reaction. If you do not know what caused it, keep a journal to try to track what caused it. Write down: What you eat and drink. What cosmetics you use. What you wear in the affected area. This includes jewelry. Contact a health care provider if: Your condition does not get better with treatment. Your condition gets worse. You have any signs of infection. You have a fever. You have new symptoms. Your bone or joint under the affected area becomes painful after the skin has healed. Get help right away if: You notice red streaks coming from the affected area. The affected area turns darker. You have trouble breathing. This information is not intended to replace advice given to you by your health care provider. Make sure you discuss any questions you have with your health care provider. Document Revised: 01/15/2022 Document Reviewed: 01/15/2022 Elsevier Patient Education  2024 [employer].

## 2024-03-30 NOTE — Progress Notes (Signed)
 Virtual Visit Consent   Katie Woodard, you are scheduled for a virtual visit with a [place] provider today. Just as with appointments in the office, your consent must be obtained to participate. Your consent will be active for this visit and any virtual visit you may have with one of our providers in the next 365 days. If you have a MyChart account, a copy of this consent can be sent to you electronically.  As this is a virtual visit, video technology does not allow for your provider to perform a traditional examination. This may limit your provider's ability to fully assess your condition. If your provider identifies any concerns that need to be evaluated in person or the need to arrange testing (such as labs, EKG, etc.), we will make arrangements to do so. Although advances in technology are sophisticated, we cannot ensure that it will always work on either your end or our end. If the connection with a video visit is poor, the visit may have to be switched to a telephone visit. With either a video or telephone visit, we are not always able to ensure that we have a secure connection.  By engaging in this virtual visit, you consent to the provision of healthcare and authorize for your insurance to be billed (if applicable) for the services provided during this visit. Depending on your insurance coverage, you may receive a charge related to this service.  I need to obtain your verbal consent now. Are you willing to proceed with your visit today? ANGLES TREVIZO has provided verbal consent on 03/30/2024 for a virtual visit (video or telephone). Loa Lamp, FNP  Date: 03/30/2024 3:31 PM   Virtual Visit via Video Note   I, Loa Lamp, connected with  Katie Woodard  ([id number], 1965/07/28) on 03/30/24 at  3:30 PM EDT by a video-enabled telemedicine application and verified that I am speaking with the correct person using two identifiers.  Location: Patient: Virtual Visit Location Patient:  Home Provider: Virtual Visit Location Provider: Home Office   I discussed the limitations of evaluation and management by telemedicine and the availability of in person appointments. The patient expressed understanding and agreed to proceed.    History of Present Illness: Katie Woodard is a 58 y.o. who identifies as a female who was assigned female at birth, and is being seen today for scabs and itching of scalp following hair dye several days ago. 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   S/P laparoscopic-assisted sigmoidectomy 02/13/2020   GERD (gastroesophageal reflux disease)    OBESITY 11/11/2009    Allergies:  Allergies  Allergen Reactions   Amlodipine  Nausea Only   Medications:  Current Outpatient Medications:    predniSONE  (DELTASONE ) 20 MG tablet, Take 1 tablet (20 mg total) by mouth 2 (two) times daily with a meal for 5 days., Disp: 10 tablet, Rfl: 0   hydrochlorothiazide  (HYDRODIURIL ) 25 MG tablet, Take 1 tablet (25 mg total) by mouth daily., Disp: 30 tablet, Rfl: 0   ibuprofen  (ADVIL ) 800 MG tablet, Take 1 tablet (800 mg total) by mouth 3 (  three) times daily., Disp: 21 tablet, Rfl: 0   linaclotide (LINZESS) 145 MCG CAPS capsule, Take 145 mcg by mouth as needed., Disp: , Rfl:    meloxicam  (MOBIC ) 15 MG tablet, TAKE 1 TABLET (15 MG TOTAL) BY MOUTH DAILY., Disp: 30 tablet, Rfl: 0   metformin (FORTAMET) 500 MG (OSM) 24 hr tablet, metFORMIN HCl, Disp: , Rfl:    metFORMIN (GLUCOPHAGE) 500 MG tablet, Take 500 mg by mouth 2 (two) times daily., Disp: , Rfl:    valsartan (DIOVAN) 80  MG tablet, Take 80 mg by mouth daily., Disp: , Rfl:   Observations/Objective: Patient is well-developed, well-nourished in no acute distress.  Resting comfortably  at home.  Head is normocephalic, atraumatic.  No labored breathing.  Speech is clear and coherent with logical content.  Patient is alert and oriented at baseline.    Assessment and Plan: 1. Allergic reaction to hair dye (Primary)  Keep scalp clean and dry, UC as needed. Stop prednisone  if glucose increases over 250. She says it normally does notaffect her glucose.   Follow Up Instructions: I discussed the assessment and treatment plan with the patient. The patient was provided an opportunity to ask questions and all were answered. The patient agreed with the plan and demonstrated an understanding of the instructions.  A copy of instructions were sent to the patient via MyChart unless otherwise noted below.     The patient was advised to call back or seek an in-person evaluation if the symptoms worsen or if the condition fails to improve as anticipated.    Mishael Haran, FNP

## 2024-04-06 ENCOUNTER — Ambulatory Visit (INDEPENDENT_AMBULATORY_CARE_PROVIDER_SITE_OTHER): Admitting: Podiatry

## 2024-04-06 ENCOUNTER — Encounter: Payer: Self-pay | Admitting: Podiatry

## 2024-04-06 DIAGNOSIS — M79674 Pain in right toe(s): Secondary | ICD-10-CM | POA: Diagnosis not present

## 2024-04-06 DIAGNOSIS — L84 Corns and callosities: Secondary | ICD-10-CM

## 2024-04-06 DIAGNOSIS — E1142 Type 2 diabetes mellitus with diabetic polyneuropathy: Secondary | ICD-10-CM

## 2024-04-06 DIAGNOSIS — M79675 Pain in left toe(s): Secondary | ICD-10-CM | POA: Diagnosis not present

## 2024-04-06 DIAGNOSIS — B351 Tinea unguium: Secondary | ICD-10-CM | POA: Diagnosis not present

## 2024-04-06 NOTE — Progress Notes (Signed)
  Subjective:  Patient ID: Katie Woodard, female    DOB: [birth year],  MRN: [id number]  Chief Complaint  Patient presents with   Foot Pain    L callus plantar 5th meta.  Very sore to touch.  Diabetic A1c 5.9.  No anti coag.    58 y.o. female presents with the above complaint. History confirmed with patient. Patient presenting with pain related to dystrophic thickened elongated nails. Patient is unable to trim own nails related to nail dystrophy. Patient does have a history of T2DM well controlled.  She complains of painful calluses bilateral subfifth metatarsal heads, left subsecond metatarsal head.  Last A1c 5.9.  Objective:  Physical Exam: warm, good capillary refill, pedal skin atrophic, decreased pedal hair growth. nail exam onychomycosis of the toenails, onycholysis, and dystrophic nails DP pulses palpable, PT pulses palpable, protective sensation absent, and vibratory sensation diminished Left Foot:  Pain with palpation of nails due to elongation and dystrophic growth.  Significant painful hyperkeratotic lesion present subfifth and subsecond metatarsal head Right Foot: Pain with palpation of nails due to elongation and dystrophic growth.  Significant painful hyperkeratotic lesion right subfifth metatarsal head Bilateral pes planus foot type noted.  Assessment:   1. Pain due to onychomycosis of toenails of both feet   2. DM type 2 with diabetic peripheral neuropathy (HCC)   3. Pre-ulcerative calluses      Plan:  Patient was evaluated and treated and all questions answered.  #Onychomycosis with pain  -Nails palliatively debrided as below. -Educated on self-care  Procedure: Nail Debridement Rationale: Pain Type of Debridement: manual, sharp debridement. Instrumentation: Nail nipper, rotary burr. Number of Nails: 10  #  Preulcerative calluses present subfifth metatarsal heads bilaterally and left subsecond metatarsal head All symptomatic hyperkeratoses x 3 were safely  debrided with a sterile #15 blade to patient's level of comfort without incident. We discussed preventative and palliative care of these lesions including supportive and accommodative shoegear, padding, prefabricated and custom molded accommodative orthoses, use of a pumice stone and lotions/creams daily. - Dancers pads applied today    # Diabetes with neuropathy Patient educated on diabetes. Discussed proper diabetic foot care and discussed risks and complications of disease. Educated patient in depth on reasons to return to the office immediately should he/she discover anything concerning or new on the feet. All questions answered. Discussed proper shoes as well.    Return in about 3 months (around 07/06/2024) for Diabetic Foot Care.         Ethan Saddler, DPM [facility] / [facility]

## 2024-05-17 ENCOUNTER — Encounter: Payer: Self-pay | Admitting: Neurology

## 2024-05-17 ENCOUNTER — Ambulatory Visit: Admitting: Neurology

## 2024-05-17 VITALS — BP 121/72 | HR 83 | Ht 59.0 in | Wt 210.0 lb

## 2024-05-17 DIAGNOSIS — G4719 Other hypersomnia: Secondary | ICD-10-CM

## 2024-05-17 DIAGNOSIS — R519 Headache, unspecified: Secondary | ICD-10-CM | POA: Diagnosis not present

## 2024-05-17 NOTE — Progress Notes (Signed)
 NEUROLOGY FOLLOW UP OFFICE NOTE  MARCE SCHARTZ [id number]  Assessment/Plan:   Recurrence of right sided headache - may be tension-type and improved with improved blood pressure.  Concern for sleep apnea:  excessive daytime sleepiness, morning headaches, hypertension and history of snoring    Refer to sleep medicine for evaluation of OSA.  If she has not been contacted in one week, she was advised to contact me. Follow up with me in 7 months or as needed.   Subjective:  BEN SANZ is a 58 year old left-handed female with CKD, DM 2, HTN, anemia, and DDD of cervical spine who follows up for headaches.  History supplemented by recent ED note  UPDATE: She was referred to sleep medicine for evaluation of sleep apnea as possible cause of morning headaches and excessive daytime sleepiness.  She says she never received a call back to schedule the appointment.    Blood pressure has stabilized as well as the headaches.  Right sided headache usually occurs with increased emotional stress and nothing too severe.    Morning headaches aren't as often but still reports excessive daytime sleepiness.     HISTORY: She presented to Urgent Care on 09/25/2022 due to not feeling well.  She started experiencing a severe right temporal headache radiating across the front of her head.  Noted blurred vision in right eye with photophobia.  No nausea, vomiting or phonophobia.  Also endorsed dyspnea, chest discomfort and generalized body aches.  She had recently had changes in her blood pressure medications due to continuing to have uncontrolled high blood pressure.  She was diagnosed with upper viral illness.  Headaches persisted, so she went to the ED on 10/04/2022.  On exam, she was noted to have weakness in the right upper and lower extremities, also numbness as well.  MRI of brain without contrast revealed mild chronic small vessel ischemic changes but no acute abnormalities.  CTA head and neck revealed  atherosclerotic plaque within the intracranial ICAs but no LVO or hemodynamically significant stenosis.  CT perfusion negative for acute infarct.  Sed rate 18 and CRP was 1.0.  Diagnosed with complicated migraine.  Denies prior history of migraines or other headaches.  Symptoms resolved over the next 48 hours.  In 2025, she had a recurrence of habitual right sided headache associated with uncontrolled hypertension (as high as 180s-200s/100s), but this time no associated blurred vision.  Maybe some mild photophobia but no nausea, vomiting, phonophobia or unilateral numbness or weakness.    She also wakes up with a dull left frontal/temporal headache about 3 days a week.  It resolves later in the morning.  She does have disrupted sleep and has excessive daytime sleepiness.  She reports history of snoring.    She has a right knee replacement in 2022.  She continues to have numbness around the knee (lower thigh and below knee).  Hasn't followed by with orthopedics.  She also has right shoulder pain.   Past NSAIDS/analgesics:  acetaminophen , naproxen  Past abortive triptans:  none Past abortive ergotamine:  none Past muscle relaxants:  Robaxin  Past anti-emetic:  Zofran , Reglan  Past antihypertensive medications:  none Past antidepressant medications:  none Past anticonvulsant medications:  none Past anti-CGRP:  none Other past therapies:  none    PAST MEDICAL HISTORY: Past Medical History:  Diagnosis Date   Abdominal pain, chronic, right lower quadrant 01/31/2014   Chronic kidney disease    mases in kidney   DDD (degenerative disc disease), cervical  DDD (degenerative disc disease), cervical 01/21/2022   Diabetes mellitus without complication (HCC)    Diverticulitis    DIVERTICULITIS OF COLON 02/26/2009   Qualifier: Diagnosis of  By: Gladis FNP, Nykedtra     GERD (gastroesophageal reflux disease)    Takes OTC meds   Headache    migraines twice a week   History of blood transfusion     Hypertension    Hypokalemia    Microcytic anemia 11/13/2008   Qualifier: Diagnosis of  By: Gladis FNP, Nykedtra     MIGRAINE HEADACHE 11/13/2008   Qualifier: Diagnosis of  By: Gladis FNP, Nykedtra     Ovarian cyst    Uterine fibroid     MEDICATIONS: Current Outpatient Medications on File Prior to Visit  Medication Sig Dispense Refill   hydrochlorothiazide  (HYDRODIURIL ) 25 MG tablet Take 1 tablet (25 mg total) by mouth daily. 30 tablet 0   ibuprofen  (ADVIL ) 800 MG tablet Take 1 tablet (800 mg total) by mouth 3 (three) times daily. 21 tablet 0   linaclotide (LINZESS) 145 MCG CAPS capsule Take 145 mcg by mouth as needed.     metformin (FORTAMET) 500 MG (OSM) 24 hr tablet metFORMIN HCl     metFORMIN (GLUCOPHAGE) 500 MG tablet Take 500 mg by mouth 2 (two) times daily.     valsartan (DIOVAN) 80 MG tablet Take 80 mg by mouth daily.     No current facility-administered medications on file prior to visit.    ALLERGIES: Allergies  Allergen Reactions   Amlodipine  Nausea Only    FAMILY HISTORY: Family History  Problem Relation Age of Onset   Migraines Mother    Cancer Mother        breast   Breast cancer Mother        20s and again in her 70s   Cancer Sister        breast x2   Breast cancer Sister        early 46s and again in late 77s   Cancer Maternal Aunt        ovarian   Schizophrenia Son    Lupus Daughter       Objective:  Blood pressure 121/72, pulse 83, height 4' 11 (1.499 m), weight 210 lb (95.3 kg), SpO2 99%. General: No acute distress.  Patient appears well-groomed.   Head:  Normocephalic/atraumatic Eyes:  Fundi examined but not visualized Neck: supple, no paraspinal tenderness, full range of motion Heart:  Regular rate and rhythm Neurological Exam: alert and oriented.  Speech fluent and not dysarthric, language intact.  CN II-XII intact. Bulk and tone normal, muscle strength 5/5 throughout, limited in right hip flexion due to knee pain.  Sensation to light touch  intact.  Deep tendon reflexes 2+ throughout, toes downgoing.  Finger to nose testing intact.  Gait normal, Romberg negative.   Juliene Dunnings, DO  CC: Zachary Conger, MD

## 2024-05-17 NOTE — Patient Instructions (Signed)
 Refer to sleep medicine.  If you do not get a call in one week, contact us 

## 2024-06-05 NOTE — Congregational Nurse Program (Signed)
 Client was seen on 05-29-2024 @ the [facility] [employer]. She has just returned back t work after lay ff of several months .Glad to be back @ work but is having difficulty getting her medication renewal, will be changing MD . Nurse referred client to mobile [facility]eta and will attend on 06-06-2024 to get medications and hopefully get scheduled int [facility] for [facility] care . CCN emphasized importance of not running out of medications . Client acknowledged that she would follow up.. Nurse will follow up .Katie Woodard States she is takng her neds and checking her blood sugars . Will need A1-C checked

## 2024-06-06 ENCOUNTER — Ambulatory Visit: Payer: Self-pay

## 2024-06-06 NOTE — Telephone Encounter (Signed)
 FYI Only or Action Required?: FYI only for provider: appointment scheduled on 11/13.  Patient was last seen in primary care on 03/30/2024 by Blair, Diane W, FNP.  Called Nurse Triage reporting Urinary Frequency.  Symptoms began several days ago.  Interventions attempted: Nothing.  Symptoms are: gradually worsening.  Triage Disposition: See HCP Within 4 Hours (Or PCP Triage)  Patient/caregiver understands and will follow disposition?: Yes        Copied from CRM [id number]. Topic: Clinical - Red Word Triage >> Jun 06, 2024  2:05 PM Fonda T wrote: Red Word that prompted transfer to Nurse Triage: Patient having irritation with urination, lower abdominal pain, with cramping.  Patient requesting an appointment for evaluation. Reason for Disposition  Side (flank) or lower back pain present  Answer Assessment - Initial Assessment Questions 1. SYMPTOM: What's the main symptom you're concerned about? (e.g., frequency, incontinence)      Irritation, burning  with urination, frequency     2. ONSET: When did the  symptoms  start?     X 3 days   3. PAIN: Is there any pain? If Yes, ask: How bad is it? (Scale: 1-10; mild, moderate, severe)     No   4. CAUSE: What do you think is causing the symptoms?      Possible UTI, unsure   5. OTHER SYMPTOMS: Do you have any other symptoms? (e.g., blood in urine, fever, flank pain, pain with urination)  lower abdominal pain, with cramping x 4 days, low grade fever, flank pain noted   Appointment scheduled for evaluation. Patient agrees with plan of care, and will call back if anything changes, or if symptoms worsen.  Protocols used: Urinary Symptoms-A-AH

## 2024-06-07 ENCOUNTER — Encounter: Payer: Self-pay | Admitting: Internal Medicine

## 2024-06-07 ENCOUNTER — Ambulatory Visit: Admitting: Internal Medicine

## 2024-06-07 VITALS — BP 122/72 | HR 78 | Temp 97.8°F | Ht 59.0 in | Wt 210.0 lb

## 2024-06-07 DIAGNOSIS — N3001 Acute cystitis with hematuria: Secondary | ICD-10-CM | POA: Diagnosis not present

## 2024-06-07 LAB — POC URINALSYSI DIPSTICK (AUTOMATED)
Glucose, UA: NEGATIVE
Ketones, UA: NEGATIVE
Nitrite, UA: NEGATIVE
Protein, UA: POSITIVE — AB
Spec Grav, UA: 1.015 (ref 1.010–1.025)
Urobilinogen, UA: 0.2 U/dL
pH, UA: 6 (ref 5.0–8.0)

## 2024-06-07 MED ORDER — CIPROFLOXACIN HCL 500 MG PO TABS: 500.0000 mg | ORAL_TABLET | Freq: Two times a day (BID) | ORAL | 0 refills | Status: AC

## 2024-06-07 NOTE — Patient Instructions (Signed)
 Drink plenty of fluids  Tylenol  as needed for fever, aches

## 2024-06-07 NOTE — Progress Notes (Signed)
 [facility] [address] [place] [address] Dept: [phone] Dept Fax: [phone]  Acute Care Office Visit  Subjective:   Katie Woodard 11/12/1965 06/07/2024  Chief Complaint  Patient presents with   Abdominal Pain    3 days burning when urinate, nausea    HPI:  Discussed the use of AI scribe software for clinical note transcription with the patient, who gave verbal consent to proceed.  History of Present Illness   The patient presents with burning during urination and cloudy urine.  She has been experiencing burning during urination for the past three days, initially attributing it to a change in soap, but symptoms persisted after switching back to her regular soap.   There is no hematuria, but her urine has been cloudy. She has not taken any medication for these symptoms yet.  She reports a low-grade fever over the past two nights and mild back pain, primarily on the right side. Additionally, she experiences nausea in the mornings and evenings, along with a decreased appetite.  Her past medical history includes diverticulitis, for which she has had stomach surgery, and irritable bowel syndrome (IBS) with constipation. She is currently on Linzess for constipation and metformin, which sometimes causes increased bowel movements.  She has a history of urinary tract infections and recalls being treated with ciprofloxacin  in the past, which she tolerated well.   The following portions of the patient's history were reviewed and updated as appropriate: past medical history, past surgical history, family history, social history, allergies, medications, and problem list.   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   S/P laparoscopic-assisted sigmoidectomy 02/13/2020   GERD (gastroesophageal reflux disease)    OBESITY 11/11/2009   Past Medical History:  Diagnosis Date   Abdominal pain, chronic, right lower quadrant 01/31/2014   Chronic kidney disease    mases in kidney   DDD (degenerative disc disease), cervical    DDD (degenerative disc disease), cervical 01/21/2022   Diabetes mellitus without complication (HCC)    Diverticulitis    DIVERTICULITIS OF COLON 02/26/2009   Qualifier: Diagnosis of  By: Gladis FNP, Nykedtra     GERD (gastroesophageal reflux disease)    Takes OTC meds   Headache    migraines twice a week   History of blood transfusion    Hypertension    Hypokalemia    Microcytic anemia 11/13/2008   Qualifier: Diagnosis of  By: Gladis FNP, Nykedtra     MIGRAINE HEADACHE 11/13/2008   Qualifier: Diagnosis of  By: Gladis FNP, Nykedtra     Ovarian cyst    Uterine fibroid    Past Surgical History:  Procedure Laterality Date   APPENDECTOMY     CESAREAN SECTION  1988   4   CHOLECYSTECTOMY  2005   [facility]   COLON SURGERY  01/2020   FLEXIBLE SIGMOIDOSCOPY N/A 02/13/2020   Procedure: FLEXIBLE SIGMOIDOSCOPY;  Surgeon: Teresa Lonni HERO, MD;  Location: [facility] ORS;  Service: General;  Laterality: N/A;   KNEE ARTHROPLASTY Right 09/10/2020   Procedure: COMPUTER ASSISTED TOTAL KNEE ARTHROPLASTY;  Surgeon: Fidel Rogue, MD;  Location: [facility];  Service: Orthopedics;  Laterality: Right;  3E   LAPAROSCOPIC APPENDECTOMY N/A 01/31/2014   Procedure: APPENDECTOMY LAPAROSCOPIC;  Surgeon: Lynwood MALVA Pina, MD;  Location: [facility] OR;  Service: General;  Laterality: N/A;   Family History  Problem Relation Age of Onset   Migraines Mother    Cancer Mother        breast   Breast cancer Mother        33s and again in her 92s   Cancer  Sister        breast x2   Breast cancer Sister        early 64s and again in late 57s   Cancer Maternal Aunt        ovarian   Schizophrenia Son    Lupus Daughter     Current Outpatient Medications:    ciprofloxacin  (CIPRO ) 500 MG tablet, Take 1 tablet (500 mg total) by mouth 2 (two) times daily for 7 days., Disp: 14 tablet, Rfl: 0   hydrochlorothiazide  (HYDRODIURIL ) 25 MG tablet, Take 1 tablet (25 mg total) by mouth daily., Disp: 30 tablet, Rfl: 0   linaclotide (LINZESS) 145 MCG CAPS capsule, Take 145 mcg by mouth as needed., Disp: , Rfl:    metformin (FORTAMET) 500 MG (OSM) 24 hr tablet, metFORMIN HCl, Disp: , Rfl:    metFORMIN (GLUCOPHAGE) 500 MG tablet, Take 500 mg by mouth 2 (two) times daily., Disp: , Rfl:    valsartan (DIOVAN) 80 MG tablet, Take 80 mg by mouth daily., Disp: , Rfl:    ibuprofen  (ADVIL ) 800 MG tablet, Take 1 tablet (800 mg total) by mouth 3 (three) times daily. (Patient not taking: Reported on 06/07/2024), Disp: 21 tablet, Rfl: 0 Allergies  Allergen Reactions   Amlodipine  Nausea Only     ROS: A complete ROS was performed with pertinent positives/negatives noted in the HPI. The remainder of the ROS are negative.    Objective:   Today's Vitals   06/07/24 1045  BP: 122/72  Pulse: 78  Temp: 97.8 F (36.6 C)  TempSrc: Temporal  SpO2: 99%  Weight: 210 lb (95.3 kg)  Height: 4' 11 (1.499 m)    GENERAL: Well-appearing, in NAD. Well nourished.  SKIN: Pink, warm and dry. No rash, lesion, ulceration, or ecchymoses.  NECK: Trachea midline. Full ROM w/o pain or tenderness. No lymphadenopathy.  RESPIRATORY: Chest wall symmetrical. Respirations even and non-labored. Breath sounds clear to auscultation bilaterally.  CARDIAC: S1, S2 present, regular rate and rhythm. Peripheral pulses 2+ bilaterally.  GI: Abdomen soft, non-tender. Normoactive bowel sounds. No CVA tenderness.  EXTREMITIES: Without clubbing, cyanosis, or edema.  NEUROLOGIC: No motor or sensory  deficits. Steady, even gait.  PSYCH/MENTAL STATUS: Alert, oriented x 3. Cooperative, appropriate mood and affect.    Results for orders placed or performed in visit on 06/07/24  Urine Culture   Specimen: Urine  Result Value Ref Range   MICRO NUMBER: [id number]    SPECIMEN QUALITY: Adequate    Sample Source NOT GIVEN    STATUS: FINAL    ISOLATE 1: Escherichia coli (A)       Susceptibility   Escherichia coli - URINE CULTURE, REFLEX    AMOX/CLAVULANIC 4 Sensitive     AMPICILLIN/SULBACTAM 4 Sensitive     CEFAZOLIN * <=1 Not Reportable      * For infections other than uncomplicated UTI caused by E. coli, K. pneumoniae or P. mirabilis: Cefazolin  is resistant if MIC > or = 8 mcg/mL. (Distinguishing susceptible versus intermediate for isolates with  MIC < or = 4 mcg/mL requires additional testing.) For uncomplicated UTI caused by E. coli, K. pneumoniae or P. mirabilis: Cefazolin  is susceptible if MIC <32 mcg/mL and predicts susceptible to the oral agents cefaclor, cefdinir, cefpodoxime, cefprozil, cefuroxime, cephalexin  and loracarbef.     CEFTAZIDIME <=0.5 Sensitive     CEFEPIME <=0.12 Sensitive     CEFTRIAXONE  <=0.25 Sensitive     CIPROFLOXACIN  <=0.06 Sensitive     LEVOFLOXACIN <=0.12 Sensitive     GENTAMICIN <=1 Sensitive     IMIPENEM <=0.25 Sensitive     MEROPENEM <=0.25 Sensitive     NITROFURANTOIN <=16 Sensitive     PIP/TAZO <=4 Sensitive     TRIMETH/SULFA* <=20 Sensitive      * For infections other than uncomplicated UTI caused by E. coli, K. pneumoniae or P. mirabilis: Cefazolin  is resistant if MIC > or = 8 mcg/mL. (Distinguishing susceptible versus intermediate for isolates with MIC < or = 4 mcg/mL requires additional testing.) For uncomplicated UTI caused by E. coli, K. pneumoniae or P. mirabilis: Cefazolin  is susceptible if MIC <32 mcg/mL and predicts susceptible to the oral agents cefaclor, cefdinir, cefpodoxime, cefprozil, cefuroxime, cephalexin  and  loracarbef. Legend: S = Susceptible  I = Intermediate R = Resistant  NS = Not susceptible SDD = Susceptible Dose Dependent * = Not Tested  NR = Not Reported **NN = See Therapy Comments   POCT Urinalysis Dipstick (Automated)  Result Value Ref Range   Color, UA     Clarity, UA     Glucose, UA Negative Negative   Bilirubin, UA 1+    Ketones, UA negative    Spec Grav, UA 1.015 1.010 - 1.025   Blood, UA 2+    pH, UA 6.0 5.0 - 8.0   Protein, UA Positive (A) Negative   Urobilinogen, UA 0.2 0.2 or 1.0 E.U./dL   Nitrite, UA negative    Leukocytes, UA Large (3+) (A) Negative      Assessment & Plan:  Acute cystitis with hematuria -     POCT Urinalysis Dipstick (Automated) -     Urine Culture -     Ciprofloxacin  HCl; Take 1 tablet (500 mg total) by mouth 2 (two) times daily for 7 days.  Dispense: 14 tablet; Refill: 0   Meds ordered this encounter  Medications   ciprofloxacin  (CIPRO ) 500 MG tablet    Sig: Take 1 tablet (500 mg total) by mouth 2 (two) times daily for 7 days.    Dispense:  14 tablet    Refill:  0    Supervising Provider:   SEBASTIAN BEVERLEY NOVAK [[id number]]   Orders Placed This Encounter  Procedures   Urine Culture   POCT Urinalysis Dipstick (Automated)   Lab Orders         Urine Culture         POCT Urinalysis Dipstick (Automated)     No images are attached to the encounter or orders placed in the encounter.  Return if symptoms worsen or fail to improve.   Rosina Senters, FNP

## 2024-06-07 NOTE — Telephone Encounter (Signed)
 Pt has appointment 06/07/24

## 2024-06-07 NOTE — Telephone Encounter (Signed)
Pt seen in office 11/13

## 2024-06-09 LAB — URINE CULTURE
MICRO NUMBER:: 17231331
SPECIMEN QUALITY:: ADEQUATE

## 2024-06-11 ENCOUNTER — Ambulatory Visit: Payer: Self-pay | Admitting: Internal Medicine

## 2024-06-11 ENCOUNTER — Ambulatory Visit: Admitting: Neurology

## 2024-06-14 NOTE — Congregational Nurse Program (Unsigned)
  Dept: [phone]   Congregational Nurse Program Note  Date of Encounter: 06/14/2024  Past Medical History: Past Medical History:  Diagnosis Date   Abdominal pain, chronic, right lower quadrant 01/31/2014   Chronic kidney disease    mases in kidney   DDD (degenerative disc disease), cervical    DDD (degenerative disc disease), cervical 01/21/2022   Diabetes mellitus without complication (HCC)    Diverticulitis    DIVERTICULITIS OF COLON 02/26/2009   Qualifier: Diagnosis of  By: Gladis FNP, Nykedtra     GERD (gastroesophageal reflux disease)    Takes OTC meds   Headache    migraines twice a week   History of blood transfusion    Hypertension    Hypokalemia    Microcytic anemia 11/13/2008   Qualifier: Diagnosis of  By: Gladis FNP, Delorise     MIGRAINE HEADACHE 11/13/2008   Qualifier: Diagnosis of  By: Gladis FNP, Nykedtra     Ovarian cyst    Uterine fibroid     Encounter Details:  Community Questionnaire - 06/12/24 1100       Questionnaire   Ask client: Do you give verbal consent for me to treat you today? Yes    Student Assistance N/A    Location Patient Information Systems Manager, [employer]    Encounter Setting CN site    [employer] Status Unknown    Insurance Medicaid    Insurance/Financial Assistance Referral N/A    Medication Have Medication Insecurities    Screening Referrals Made N/A    Medical Referrals Made [facility] PCP/Clinic    Medical Appointment Completed N/A    CNP Interventions Advocate/Support;Counsel;Educate;Navigate Healthcare System    Screenings CN Performed N/A    ED Visit Averted N/A    Life-Saving Intervention Made N/A

## 2024-06-14 NOTE — Congregational Nurse Program (Unsigned)
 CCN followed up with client to see if she made it to medical fleeta last week. States she went but fleeta wasn't there but that she did get her meds and is waiting on a call to get scheduled in for PCP care. Nurse will follow up if not connected within the next two weeks will refer to virtual PCP at [facility] .

## 2024-06-27 ENCOUNTER — Ambulatory Visit: Admitting: Primary Care

## 2024-06-27 ENCOUNTER — Encounter: Payer: Self-pay | Admitting: Primary Care

## 2024-06-27 VITALS — BP 130/74 | HR 89 | Temp 97.6°F | Ht 59.0 in | Wt 209.0 lb

## 2024-06-27 DIAGNOSIS — G4719 Other hypersomnia: Secondary | ICD-10-CM

## 2024-06-27 NOTE — Patient Instructions (Addendum)
  VISIT SUMMARY: You came in today for an evaluation of your sleep apnea. You have been experiencing significant daytime sleepiness, frequent yawning, and headaches if you do not rest. You have also lost 30 pounds recently. We discussed your sleep patterns, potential causes of your symptoms, and your medical history, including hypertension, acid reflux, IBS-C, and depression.  YOUR PLAN: -DAYTIME SLEEPINESS UNDER EVALUATION FOR SLEEP APNEA: Daytime sleepiness can be caused by sleep apnea, which is a condition where your breathing repeatedly stops and starts during sleep. We have ordered a home sleep study to evaluate for sleep apnea. In the meantime, try to sleep on your side and continue with your weight loss efforts. If sleep apnea is confirmed, we may consider using a CPAP machine to help with your breathing at night. You can also try taking melatonin (1-3 mg) over the counter to help with sleep. If insomnia persists, we may consider using trazodone in the future.  -GASTROESOPHAGEAL REFLUX DISEASE (GERD): GERD is a condition where stomach acid frequently flows back into the tube connecting your mouth and stomach, causing discomfort. To manage your reflux, avoid caffeine, citrus, spicy foods, tomatoes, and fried foods, especially late at night. Elevate your head with a wedge pillow to help with reflux and potential sleep apnea.  -HYPERTENSION: Hypertension is high blood pressure. It is currently managed with hydrochlorothiazide  25 mg once daily and Diovan 80 mg daily. Continue taking your medications as prescribed.  -TYPE 2 DIABETES MELLITUS: Type 2 diabetes is a condition that affects the way your body processes blood sugar. It is currently managed with metformin 500 mg twice daily. Continue taking your medication as prescribed.  -OBESITY (BMI 42): Obesity is a condition where you have an excessive amount of body fat. Your current BMI is 42. You have lost 30 pounds, which is a great start. Continue  with your weight loss efforts as it can help with sleep apnea and improve your overall health.  -IRRITABLE BOWEL SYNDROME WITH CONSTIPATION (IBS-C): IBS-C is a common disorder that affects the large intestine and causes constipation. It is managed with Linzess as needed. Continue taking your medication as prescribed.  INSTRUCTIONS: Please complete the home sleep study as ordered. Follow up with us  once the results are available. Continue with your current medications and lifestyle changes as discussed. If you have any new symptoms or concerns, please contact our office.  Follow-up 2 months with Landry NP - virtual visit on Friday

## 2024-06-27 NOTE — Progress Notes (Signed)
 @Patient  ID: Katie Woodard, female    DOB: May 18, 1966, 58 y.o.   MRN: [id number]  Chief Complaint  Patient presents with   Consult    Daytime sleepiness. Never tested or diagnosed     Referring provider: Skeet Juliene SAUNDERS, DO  HPI: 58 year old female, never smoked. PMH significant for HTN, GERD, IBS-C, diverticulitis s/p lap signosidectomy, right total knee replacement, obesity.   06/27/2024 Discussed the use of AI scribe software for clinical note transcription with the patient, who gave verbal consent to proceed.  History of Present Illness Katie Woodard is a 58 year old female with hypertension and obesity who presents for evaluation of sleep apnea. She was referred by Dr. Skeet for evaluation of sleep apnea.  She experiences significant daytime sleepiness, particularly in the early afternoon, accompanied by frequent yawning and headaches if she does not rest for about 5 to 15 minutes. Her sleep pattern includes going to bed around 9 or 10 PM, taking a couple of hours to fall asleep, and waking up 2 to 3 times a night. She starts her day at 6 AM. She has not used any sleep aids in the past and has not undergone a sleep study before.  She has lost about 30 pounds, with her weight decreasing from a high of 240 pounds to 209 pounds. Her BMI is 42.  She rates a high likelihood of falling asleep while sitting and reading, watching TV, and a moderate chance of falling asleep in public or as a passenger in a car for an hour, laying down in the afternoon, sitting and talking to someone, and after eating lunch. Her Epworth Sleepiness Scale score is 16 out of 24, indicating moderate to high daytime sleepiness.  She is unsure if she snores and has not experienced waking up gasping or choking unless she eats a lot before bed, which she attributes to reflux. No sudden sleep attacks, loss of consciousness with laughing or crying, sleepwalking, or injuring herself during sleep.  Her medical  history includes hypertension, for which she takes hydrochlorothiazide  25 mg once daily and Diovan 80 mg. She also has a history of acid reflux, IBS-C, and is status post laparoscopic sigmoidectomy for diverticulitis and right total knee replacement.  She is not currently on any medication for mood and describes her depression as manageable, though it is affected by the holidays due to the loss of her son last year. She denies any thoughts of self-harm.   Allergies  Allergen Reactions   Amlodipine  Nausea Only    Immunization History  Administered Date(s) Administered   Td 07/26/2004    Past Medical History:  Diagnosis Date   Abdominal pain, chronic, right lower quadrant 01/31/2014   Chronic kidney disease    mases in kidney   DDD (degenerative disc disease), cervical    DDD (degenerative disc disease), cervical 01/21/2022   Diabetes mellitus without complication (HCC)    Diverticulitis    DIVERTICULITIS OF COLON 02/26/2009   Qualifier: Diagnosis of  By: Gladis FNP, Nykedtra     GERD (gastroesophageal reflux disease)    Takes OTC meds   Headache    migraines twice a week   History of blood transfusion    Hypertension    Hypokalemia    Microcytic anemia 11/13/2008   Qualifier: Diagnosis of  By: Gladis FNP, Delorise     MIGRAINE HEADACHE 11/13/2008   Qualifier: Diagnosis of  By: Gladis FNP, Nykedtra     Ovarian cyst  Uterine fibroid     Tobacco History: Social History   Tobacco Use  Smoking Status Never  Smokeless Tobacco Never   Counseling given: Not Answered   Outpatient Medications Prior to Visit  Medication Sig Dispense Refill   hydrochlorothiazide  (HYDRODIURIL ) 25 MG tablet Take 1 tablet (25 mg total) by mouth daily. 30 tablet 0   ibuprofen  (ADVIL ) 800 MG tablet Take 1 tablet (800 mg total) by mouth 3 (three) times daily. 21 tablet 0   linaclotide (LINZESS) 145 MCG CAPS capsule Take 145 mcg by mouth as needed.     metformin (FORTAMET) 500 MG (OSM) 24 hr  tablet metFORMIN HCl     metFORMIN (GLUCOPHAGE) 500 MG tablet Take 500 mg by mouth 2 (two) times daily.     valsartan (DIOVAN) 80 MG tablet Take 80 mg by mouth daily.     No facility-administered medications prior to visit.   Review of Systems  Review of Systems  Constitutional:  Positive for fatigue.  Respiratory: Negative.     Physical Exam  BP 130/74   Pulse 89   Temp 97.6 F (36.4 C)   Ht 4' 11 (1.499 m) Comment: pt stated  Wt 209 lb (94.8 kg)   SpO2 97% Comment: ra  BMI 42.21 kg/m  Physical Exam Constitutional:      Appearance: Normal appearance. She is well-developed.  HENT:     Head: Normocephalic and atraumatic.     Mouth/Throat:     Mouth: Mucous membranes are moist.     Pharynx: Oropharynx is clear.  Eyes:     Pupils: Pupils are equal, round, and reactive to light.  Cardiovascular:     Rate and Rhythm: Normal rate and regular rhythm.     Heart sounds: Normal heart sounds. No murmur heard. Pulmonary:     Effort: Pulmonary effort is normal. No respiratory distress.     Breath sounds: Normal breath sounds. No wheezing or rhonchi.  Musculoskeletal:        General: Normal range of motion.     Cervical back: Normal range of motion and neck supple.  Skin:    General: Skin is warm and dry.     Findings: No erythema or rash.  Neurological:     General: No focal deficit present.     Mental Status: She is alert and oriented to person, place, and time. Mental status is at baseline.  Psychiatric:        Mood and Affect: Mood normal.        Behavior: Behavior normal.        Thought Content: Thought content normal.        Judgment: Judgment normal.     Lab Results:  CBC    Component Value Date/Time   WBC 8.8 11/18/2023 0602   RBC 6.08 (H) 11/18/2023 0602   HGB 11.9 (L) 11/18/2023 0602   HCT 41.0 11/18/2023 0602   PLT 347 11/18/2023 0602   MCV 67.4 (L) 11/18/2023 0602   MCH 19.6 (L) 11/18/2023 0602   MCHC 29.0 (L) 11/18/2023 0602   RDW 19.7 (H)  11/18/2023 0602   LYMPHSABS 1.9 10/04/2022 1335   MONOABS 0.5 10/04/2022 1335   EOSABS 0.1 10/04/2022 1335   BASOSABS 0.0 10/04/2022 1335    BMET    Component Value Date/Time   NA 137 11/18/2023 0602   K 3.6 11/18/2023 0602   CL 103 11/18/2023 0602   CO2 25 11/18/2023 0602   GLUCOSE 129 (H) 11/18/2023 0602   BUN  6 11/18/2023 0602   CREATININE 0.54 11/18/2023 0602   CALCIUM 8.9 11/18/2023 0602   GFRNONAA >60 11/18/2023 0602   GFRAA >60 03/02/2020 1350    BNP    Component Value Date/Time   BNP 8.0 06/19/2021 1120    ProBNP No results found for: PROBNP  Imaging: No results found.   Assessment & Plan:   1. Excessive daytime sleepiness (Primary) - Home sleep test; Future  Assessment & Plan Excessive daytime sleepiness Moderate to high daytime sleepiness with an Epworth Sleepiness Scale score of 16. Symptoms suggestive of sleep apnea include snoring and waking up gasping, particularly after large meals. No history of narcolepsy, sleepwalking, or sudden sleep attacks. Differential diagnosis includes sleep deficiency, sleep apnea, depression, lifestyle factors, vitamin deficiencies, hormonal imbalances, and thyroid issues. Sleep apnea is suspected due to the nature of symptoms and BMI. - Ordered home sleep study through [facility] to evaluate for sleep apnea. - Advised on positional sleep and weight loss as potential interventions for sleep apnea. - Discussed potential use of CPAP if sleep apnea is confirmed, especially given the severity of daytime sleepiness. - Consider melatonin 1-3 mg over the counter for sleep. - Discussed potential future use of trazodone (or tricyclic antidepressant) for sleep if insomnia persists due to underlying depression   Gastroesophageal reflux disease (GERD) Reflux symptoms exacerbated by large meals, leading to waking up gasping. - Advised avoiding caffeine, citrus, spicy foods, tomatoes, and fried foods, especially late meals. -  Recommended elevating head with a wedge pillow to help with reflux and potential sleep apnea.  Hypertension Managed with hydrochlorothiazide  25 mg once daily and Diovan 80 mg daily.  Type 2 diabetes mellitus Managed with metformin 500 mg twice daily.  Obesity (BMI 42) BMI of 42, indicating obesity. Weight loss of 30 pounds noted, with current weight at 209 pounds. - Discussed weight loss as a potential intervention for sleep apnea and overall health improvement.  Depression Manageable depression following the loss of her son. No current medication. No thoughts of self-harm or active plans.  Irritable bowel syndrome with constipation (IBS-C) IBS-C managed with Linzess as needed.    Almarie LELON Ferrari, NP 06/27/2024

## 2024-07-12 ENCOUNTER — Ambulatory Visit: Admitting: Podiatry

## 2024-07-12 ENCOUNTER — Encounter: Payer: Self-pay | Admitting: Podiatry

## 2024-07-12 DIAGNOSIS — B351 Tinea unguium: Secondary | ICD-10-CM

## 2024-07-12 DIAGNOSIS — M79674 Pain in right toe(s): Secondary | ICD-10-CM

## 2024-07-12 DIAGNOSIS — E1142 Type 2 diabetes mellitus with diabetic polyneuropathy: Secondary | ICD-10-CM | POA: Diagnosis not present

## 2024-07-12 DIAGNOSIS — M79675 Pain in left toe(s): Secondary | ICD-10-CM

## 2024-07-12 DIAGNOSIS — L84 Corns and callosities: Secondary | ICD-10-CM

## 2024-07-12 NOTE — Progress Notes (Signed)
°  Subjective:  Patient ID: Katie Woodard, female    DOB: Jan 25, 1966,  MRN: [id number]  Chief Complaint  Patient presents with   [facility]    [facility] unknown recent A1c.  No anti coag      58 y.o. female presents with the above complaint. History confirmed with patient. Patient presenting with pain related to dystrophic thickened elongated nails. Patient is unable to trim own nails related to nail dystrophy. Patient does have a history of T2DM well controlled.  She complains of painful calluses bilateral subfifth metatarsal heads, left subsecond metatarsal head.  She is unsure of her last A1c.  She reports that she is switching PCPs in January.  Objective:  Physical Exam: warm, good capillary refill, pedal skin atrophic, decreased pedal hair growth. nail exam onychomycosis of the toenails, onycholysis, and dystrophic nails DP pulses palpable, PT pulses palpable, protective sensation absent, and vibratory sensation diminished Left Foot:  Pain with palpation of nails due to elongation and dystrophic growth.  Significant painful hyperkeratotic lesion present subfifth and subsecond metatarsal head Right Foot: Pain with palpation of nails due to elongation and dystrophic growth.  Significant painful hyperkeratotic lesion right subfifth metatarsal head Bilateral pes planus foot type noted.  Assessment:   1. Pain due to onychomycosis of toenails of both feet   2. DM type 2 with diabetic peripheral neuropathy (HCC)   3. Pre-ulcerative calluses      Plan:  Patient was evaluated and treated and all questions answered.  #Onychomycosis with pain  -Nails palliatively debrided as below. -Educated on self-care  Procedure: Nail Debridement Rationale: Pain Type of Debridement: manual, sharp debridement. Instrumentation: Nail nipper, rotary burr. Number of Nails: 10   #  Preulcerative calluses present subfifth metatarsal heads bilaterally and left subsecond metatarsal head All symptomatic  hyperkeratoses x3 were safely debrided with a sterile #312 blade to patient's level of comfort without incident. We discussed preventative and palliative care of these lesions including supportive and accommodative shoegear, padding, prefabricated and custom molded accommodative orthoses, use of a pumice stone and lotions/creams daily.  # Diabetes with neuropathy Patient educated on diabetes. Discussed proper diabetic foot care and discussed risks and complications of disease. Educated patient in depth on reasons to return to the office immediately should he/she discover anything concerning or new on the feet. All questions answered. Discussed proper shoes as well.    Return in about 3 months (around 10/10/2024) for Diabetic Foot Care.         Ethan Saddler, DPM [facility] / [facility]

## 2024-07-16 ENCOUNTER — Ambulatory Visit: Admitting: Primary Care

## 2024-08-21 ENCOUNTER — Encounter: Admitting: Internal Medicine

## 2024-08-27 ENCOUNTER — Encounter: Admitting: Physician Assistant

## 2024-08-27 ENCOUNTER — Ambulatory Visit: Payer: Self-pay

## 2024-08-27 NOTE — Progress Notes (Signed)
 Error. Patient was able to be rescheduled with her PCP tomorrow afternoon at 1pm. Wishes to cancel this visit.   NO CHARGE.

## 2024-08-27 NOTE — Telephone Encounter (Signed)
" °  Patient/caregiver understands and will follow disposition?: FYI Only or Action Required?: FYI only for provider: appointment scheduled on 2/3.  Patient was last seen in primary care on 06/07/2024 by Billy Knee, FNP.  Called Nurse Triage reporting Headache.  Symptoms began several days ago.  Interventions attempted: Nothing.  Symptoms are: stable.  Triage Disposition: See Physician Within 24 Hours  Patient/caregiver understands and will follow disposition?:   Reason for Triage: Patient had a new patient appnt for 08-21-24 with Knee Billy that was cancelled due to weather,   And states she is having issues with high blood pressure and her diabetes,  she is having extremely painful headaches, Is dizzy , and has a knot on the left side of her head that is causing a very painful ear ache and tooth ache  Reason for Disposition  [1] MODERATE headache (e.g., interferes with normal activities) AND [2] present > 24 hours AND [3] unexplained  (Exceptions: Pain medicines not tried, typical migraine, or headache part of viral illness.)  Answer Assessment - Initial Assessment Questions 1. LOCATION: Where does it hurt?      Rt side 2. ONSET: When did the headache start? (e.g., minutes, hours, days)      Few days 3. PATTERN: Does the pain come and go, or has it been constant since it started?     intermittent 4. SEVERITY: How bad is the pain? and What does it keep you from doing?  (e.g., Scale 1-10; mild, moderate, or severe)     8/10 5. RECURRENT SYMPTOM: Have you ever had headaches before? If Yes, ask: When was the last time? and What happened that time?      no 6. CAUSE: What do you think is causing the headache?     no 7. MIGRAINE: Have you been diagnosed with migraine headaches? If Yes, ask: Is this headache similar?      no 8. HEAD INJURY: Has there been any recent injury to your head?      no 9. OTHER SYMPTOMS: Do you have any other symptoms? (e.g., fever,  stiff neck, eye pain, sore throat, cold symptoms)     Has runny nose, eye drainage.  Protocols used: Headache-A-AH  "

## 2024-08-28 ENCOUNTER — Ambulatory Visit: Admitting: Internal Medicine

## 2024-08-28 ENCOUNTER — Encounter: Payer: Self-pay | Admitting: Internal Medicine

## 2024-08-28 VITALS — BP 130/72 | HR 80 | Temp 98.0°F | Ht 59.0 in | Wt 200.2 lb

## 2024-08-28 DIAGNOSIS — I10 Essential (primary) hypertension: Secondary | ICD-10-CM

## 2024-08-28 DIAGNOSIS — L0291 Cutaneous abscess, unspecified: Secondary | ICD-10-CM | POA: Diagnosis not present

## 2024-08-28 MED ORDER — VALSARTAN 80 MG PO TABS: 80.0000 mg | ORAL_TABLET | Freq: Every day | ORAL | 1 refills | Status: AC

## 2024-08-28 MED ORDER — MUPIROCIN 2 % EX OINT: 1.0000 | TOPICAL_OINTMENT | Freq: Two times a day (BID) | CUTANEOUS | 1 refills | Status: AC

## 2024-08-28 MED ORDER — HYDROCHLOROTHIAZIDE 25 MG PO TABS: 25.0000 mg | ORAL_TABLET | Freq: Every day | ORAL | 1 refills | Status: AC

## 2024-08-28 NOTE — Patient Instructions (Signed)
 Abscess:  Apply warm wash cloth 2-3 times a day for 10 minutes  Change wash cloth daily when washing face  Apply antibiotic ointment as prescribed

## 2024-08-30 ENCOUNTER — Other Ambulatory Visit: Payer: Self-pay | Admitting: Internal Medicine

## 2024-08-30 DIAGNOSIS — G43109 Migraine with aura, not intractable, without status migrainosus: Secondary | ICD-10-CM

## 2024-10-02 ENCOUNTER — Encounter: Admitting: Pulmonary Disease

## 2024-10-04 ENCOUNTER — Ambulatory Visit: Admitting: Internal Medicine

## 2024-10-11 ENCOUNTER — Ambulatory Visit: Admitting: Podiatry

## 2024-12-25 ENCOUNTER — Ambulatory Visit: Admitting: Neurology
